# Patient Record
Sex: Female | Born: 1970 | Hispanic: No | Smoking: Never smoker
Health system: Southern US, Community
[De-identification: ages and names within clinical notes are randomized; demographics above are authoritative.]

## PROBLEM LIST (undated history)

## (undated) DIAGNOSIS — M545 Low back pain, unspecified: Secondary | ICD-10-CM

## (undated) DIAGNOSIS — R079 Chest pain, unspecified: Secondary | ICD-10-CM

## (undated) DIAGNOSIS — R87629 Unspecified abnormal cytological findings in specimens from vagina: Secondary | ICD-10-CM

## (undated) DIAGNOSIS — G510 Bell's palsy: Secondary | ICD-10-CM

## (undated) DIAGNOSIS — R7303 Prediabetes: Secondary | ICD-10-CM

## (undated) DIAGNOSIS — R202 Paresthesia of skin: Secondary | ICD-10-CM

## (undated) DIAGNOSIS — J45909 Unspecified asthma, uncomplicated: Secondary | ICD-10-CM

## (undated) DIAGNOSIS — M549 Dorsalgia, unspecified: Secondary | ICD-10-CM

## (undated) DIAGNOSIS — T8859XA Other complications of anesthesia, initial encounter: Secondary | ICD-10-CM

## (undated) DIAGNOSIS — D649 Anemia, unspecified: Secondary | ICD-10-CM

## (undated) DIAGNOSIS — K760 Fatty (change of) liver, not elsewhere classified: Secondary | ICD-10-CM

## (undated) DIAGNOSIS — I10 Essential (primary) hypertension: Secondary | ICD-10-CM

## (undated) DIAGNOSIS — R011 Cardiac murmur, unspecified: Secondary | ICD-10-CM

## (undated) DIAGNOSIS — M79609 Pain in unspecified limb: Secondary | ICD-10-CM

## (undated) DIAGNOSIS — E739 Lactose intolerance, unspecified: Secondary | ICD-10-CM

## (undated) DIAGNOSIS — Z973 Presence of spectacles and contact lenses: Secondary | ICD-10-CM

## (undated) DIAGNOSIS — E039 Hypothyroidism, unspecified: Secondary | ICD-10-CM

## (undated) DIAGNOSIS — Z8742 Personal history of other diseases of the female genital tract: Secondary | ICD-10-CM

## (undated) DIAGNOSIS — G459 Transient cerebral ischemic attack, unspecified: Secondary | ICD-10-CM

## (undated) DIAGNOSIS — Z91018 Allergy to other foods: Secondary | ICD-10-CM

## (undated) DIAGNOSIS — J189 Pneumonia, unspecified organism: Secondary | ICD-10-CM

## (undated) DIAGNOSIS — E079 Disorder of thyroid, unspecified: Secondary | ICD-10-CM

## (undated) DIAGNOSIS — K5900 Constipation, unspecified: Secondary | ICD-10-CM

## (undated) HISTORY — DX: Transient cerebral ischemic attack, unspecified: G45.9

## (undated) HISTORY — PX: WISDOM TOOTH EXTRACTION: SHX21

## (undated) HISTORY — PX: COLONOSCOPY: SHX174

## (undated) HISTORY — DX: Constipation, unspecified: K59.00

## (undated) HISTORY — DX: Fatty (change of) liver, not elsewhere classified: K76.0

## (undated) HISTORY — PX: ESSURE TUBAL LIGATION: SUR464

## (undated) HISTORY — DX: Unspecified abnormal cytological findings in specimens from vagina: R87.629

## (undated) HISTORY — DX: Allergy to other foods: Z91.018

## (undated) HISTORY — DX: Lactose intolerance, unspecified: E73.9

## (undated) HISTORY — DX: Dorsalgia, unspecified: M54.9

---

## 2000-05-25 ENCOUNTER — Ambulatory Visit (HOSPITAL_COMMUNITY): Admission: RE | Admit: 2000-05-25 | Discharge: 2000-05-25 | Payer: Self-pay | Admitting: *Deleted

## 2000-11-20 ENCOUNTER — Inpatient Hospital Stay (HOSPITAL_COMMUNITY): Admission: AD | Admit: 2000-11-20 | Discharge: 2000-11-20 | Payer: Self-pay | Admitting: Obstetrics & Gynecology

## 2000-12-01 ENCOUNTER — Inpatient Hospital Stay (HOSPITAL_COMMUNITY): Admission: AD | Admit: 2000-12-01 | Discharge: 2000-12-03 | Payer: Self-pay | Admitting: Obstetrics and Gynecology

## 2004-02-02 ENCOUNTER — Other Ambulatory Visit: Admission: RE | Admit: 2004-02-02 | Discharge: 2004-02-02 | Payer: Self-pay | Admitting: Obstetrics and Gynecology

## 2008-11-14 HISTORY — PX: ENDOMETRIAL ABLATION: SHX621

## 2009-07-07 ENCOUNTER — Emergency Department (HOSPITAL_COMMUNITY): Admission: EM | Admit: 2009-07-07 | Discharge: 2009-07-07 | Payer: Self-pay | Admitting: Emergency Medicine

## 2010-09-23 ENCOUNTER — Encounter: Admission: RE | Admit: 2010-09-23 | Discharge: 2010-09-23 | Payer: Self-pay | Admitting: Internal Medicine

## 2011-02-19 LAB — URINALYSIS, ROUTINE W REFLEX MICROSCOPIC
Bilirubin Urine: NEGATIVE
Glucose, UA: NEGATIVE mg/dL
Hgb urine dipstick: NEGATIVE
Ketones, ur: NEGATIVE mg/dL
Nitrite: NEGATIVE
Protein, ur: NEGATIVE mg/dL
Specific Gravity, Urine: 1.014 (ref 1.005–1.030)
Urobilinogen, UA: 0.2 mg/dL (ref 0.0–1.0)
pH: 6.5 (ref 5.0–8.0)

## 2011-02-19 LAB — POCT I-STAT, CHEM 8
BUN: 11 mg/dL (ref 6–23)
Calcium, Ion: 1.12 mmol/L (ref 1.12–1.32)
Chloride: 103 mEq/L (ref 96–112)
Creatinine, Ser: 0.6 mg/dL (ref 0.4–1.2)
Glucose, Bld: 88 mg/dL (ref 70–99)
HCT: 43 % (ref 36.0–46.0)
Hemoglobin: 14.6 g/dL (ref 12.0–15.0)
Potassium: 3.5 mEq/L (ref 3.5–5.1)
Sodium: 139 mEq/L (ref 135–145)
TCO2: 25 mmol/L (ref 0–100)

## 2011-02-19 LAB — URINE MICROSCOPIC-ADD ON

## 2011-02-19 LAB — CBC
HCT: 40 % (ref 36.0–46.0)
Hemoglobin: 13.6 g/dL (ref 12.0–15.0)
MCHC: 34 g/dL (ref 30.0–36.0)
MCV: 88.8 fL (ref 78.0–100.0)
Platelets: 150 10*3/uL (ref 150–400)
RBC: 4.51 MIL/uL (ref 3.87–5.11)
RDW: 16.5 % — ABNORMAL HIGH (ref 11.5–15.5)
WBC: 6.7 10*3/uL (ref 4.0–10.5)

## 2011-02-19 LAB — DIFFERENTIAL
Basophils Absolute: 0 10*3/uL (ref 0.0–0.1)
Basophils Relative: 0 % (ref 0–1)
Eosinophils Absolute: 0.2 10*3/uL (ref 0.0–0.7)
Eosinophils Relative: 3 % (ref 0–5)
Lymphocytes Relative: 26 % (ref 12–46)
Lymphs Abs: 1.7 10*3/uL (ref 0.7–4.0)
Monocytes Absolute: 0.5 10*3/uL (ref 0.1–1.0)
Monocytes Relative: 8 % (ref 3–12)
Neutro Abs: 4.3 10*3/uL (ref 1.7–7.7)
Neutrophils Relative %: 63 % (ref 43–77)

## 2011-02-19 LAB — POCT CARDIAC MARKERS
CKMB, poc: <1 ng/mL — ABNORMAL LOW (ref 1.0–8.0)
Myoglobin, poc: 39.6 ng/mL (ref 12–200)
Troponin i, poc: <0.05 ng/mL (ref 0.00–0.09)

## 2011-02-19 LAB — POCT PREGNANCY, URINE: Preg Test, Ur: NEGATIVE

## 2011-11-02 ENCOUNTER — Ambulatory Visit (INDEPENDENT_AMBULATORY_CARE_PROVIDER_SITE_OTHER): Payer: BC Managed Care – PPO

## 2011-11-02 DIAGNOSIS — M545 Low back pain, unspecified: Secondary | ICD-10-CM

## 2011-11-02 DIAGNOSIS — E039 Hypothyroidism, unspecified: Secondary | ICD-10-CM

## 2012-02-08 ENCOUNTER — Emergency Department (HOSPITAL_COMMUNITY): Payer: BC Managed Care – PPO

## 2012-02-08 ENCOUNTER — Ambulatory Visit (INDEPENDENT_AMBULATORY_CARE_PROVIDER_SITE_OTHER): Payer: BC Managed Care – PPO | Admitting: Family Medicine

## 2012-02-08 ENCOUNTER — Encounter (HOSPITAL_COMMUNITY): Payer: Self-pay

## 2012-02-08 ENCOUNTER — Other Ambulatory Visit: Payer: Self-pay

## 2012-02-08 ENCOUNTER — Observation Stay (HOSPITAL_COMMUNITY)
Admission: EM | Admit: 2012-02-08 | Discharge: 2012-02-09 | Disposition: A | Discharge status: Home / Self Care | Payer: BC Managed Care – PPO | Attending: Emergency Medicine | Admitting: Emergency Medicine

## 2012-02-08 VITALS — BP 133/82 | HR 63 | Temp 98.3°F | Resp 16 | Ht 62.0 in | Wt 183.0 lb

## 2012-02-08 DIAGNOSIS — R0602 Shortness of breath: Secondary | ICD-10-CM

## 2012-02-08 DIAGNOSIS — R079 Chest pain, unspecified: Secondary | ICD-10-CM

## 2012-02-08 DIAGNOSIS — R0989 Other specified symptoms and signs involving the circulatory and respiratory systems: Secondary | ICD-10-CM | POA: Insufficient documentation

## 2012-02-08 DIAGNOSIS — R0600 Dyspnea, unspecified: Secondary | ICD-10-CM

## 2012-02-08 DIAGNOSIS — E039 Hypothyroidism, unspecified: Secondary | ICD-10-CM

## 2012-02-08 DIAGNOSIS — R5383 Other fatigue: Secondary | ICD-10-CM

## 2012-02-08 DIAGNOSIS — R110 Nausea: Secondary | ICD-10-CM

## 2012-02-08 DIAGNOSIS — R0609 Other forms of dyspnea: Secondary | ICD-10-CM | POA: Insufficient documentation

## 2012-02-08 DIAGNOSIS — R5381 Other malaise: Secondary | ICD-10-CM

## 2012-02-08 HISTORY — DX: Anemia, unspecified: D64.9

## 2012-02-08 HISTORY — DX: Disorder of thyroid, unspecified: E07.9

## 2012-02-08 LAB — CBC
HCT: 39.9 % (ref 36.0–46.0)
Hemoglobin: 13.6 g/dL (ref 12.0–15.0)
MCH: 30.4 pg (ref 26.0–34.0)
MCHC: 34.1 g/dL (ref 30.0–36.0)
MCV: 89.1 fL (ref 78.0–100.0)
Platelets: 202 10*3/uL (ref 150–400)
RBC: 4.48 MIL/uL (ref 3.87–5.11)
RDW: 13.7 % (ref 11.5–15.5)
WBC: 6.2 10*3/uL (ref 4.0–10.5)

## 2012-02-08 LAB — BASIC METABOLIC PANEL
BUN: 9 mg/dL (ref 6–23)
CO2: 29 mEq/L (ref 19–32)
Calcium: 8.8 mg/dL (ref 8.4–10.5)
Chloride: 101 mEq/L (ref 96–112)
Creatinine, Ser: 0.6 mg/dL (ref 0.50–1.10)
GFR calc Af Amer: >90 mL/min (ref >90)
GFR calc non Af Amer: >90 mL/min (ref >90)
Glucose, Bld: 85 mg/dL (ref 70–99)
Potassium: 3.7 mEq/L (ref 3.5–5.1)
Sodium: 136 mEq/L (ref 135–145)

## 2012-02-08 LAB — TROPONIN I: Troponin I: <0.3 ng/mL (ref <0.30)

## 2012-02-08 MED ORDER — ONDANSETRON HCL 4 MG/2ML IJ SOLN
INTRAMUSCULAR | Status: AC
  Administered 2012-02-08: 4 mg
  Filled 2012-02-08: qty 2

## 2012-02-08 NOTE — Progress Notes (Signed)
Subjective:    Patient ID: Sarah Benjamin, female    DOB: [birth year], 41 y.o.   MRN: [id number]  HPI Sarah Benjamin is a 41 y.o. female Hx of hypothyroidism - on meds, C/o pain in center of chest - woke up at 3am with pain - sharp and pressure pain, woke up out of sleep, no similar pain in past, no initial radiation - feels like something pressing out. Nausea with pain, but no vomiting. No diaphoresis, but felt fatigue, and dyspnea - like had been walking awhile. Lasted 5 minutes. Pain off and on since that time all day, comes off and own - lasts for about a minute then resolves.  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 Pain into neck and radiated down L arm today at work - at 8am.  Feels sore in area now, and sore to press on area of chest.  No belching, denies heartburn, but nausea persisted today.  Current pain 6-7/10.  Pain 10/10 at 3 am.  Has been for fatigued past 1 week - more dyspnea on exertion.  2 pillows, but no orthopnea.as felt dizziness past 2 weeks - worse with sitting up or stretching.   Both legs felt numb 2 days ago.  Hx hip/back pain in December - has not taken Mobic or flexeril recently. Has felt dizziness past 2 weeks - worse with sitting up or stretching.   Both legs felt numb 2 days ago.  Hx hip/back pain in December - has not taken Mobic or flexeril recently.   Tx: ASA 325mg  x 2 for headache this morning at 8 am.  No tobacco, Etoh, or illicit drug use. No known FH CAD, but dad and sister with "hole in heart".  Review of Systems  Constitutional: Positive for fatigue. Negative for fever and chills.  Respiratory: Positive for chest tightness and shortness of breath.   Cardiovascular: Positive for chest pain.  Gastrointestinal: Positive for nausea.  Skin: Negative for rash.  Neurological: Positive for dizziness.   EKG: NSR, no acute findings, no apparent change form      Objective:   Physical Exam  Constitutional: She appears well-developed and well-nourished.  No distress.  HENT:  Head: Normocephalic and atraumatic.  Mouth/Throat: Oropharynx is clear and moist.  Eyes: Conjunctivae are normal. Pupils are equal, round, and reactive to light.  Neck: No JVD present. Carotid bruit is not present.       Slight discomfort with neck ROM.  Cardiovascular: Normal rate, regular rhythm, normal heart sounds and intact distal pulses.  PMI is not displaced.  Exam reveals no gallop and no friction rub.   No murmur heard. Pulmonary/Chest: Effort normal and breath sounds normal. She has no wheezes. She has no rales.  Abdominal: Soft.  Musculoskeletal: Normal range of motion. She exhibits no tenderness.       Right lower leg: She exhibits no tenderness and no swelling.       Left lower leg: She exhibits no tenderness and no swelling.  Skin: She is not diaphoretic.       Assessment & Plan:  Sarah Benjamin is a 41 y.o. female 1. Hypothyroidism   2. Chest pain   3. Nausea   4. Fatigue   5. Dyspnea   1 week of fatigue, dyspnea on exertion, and 2 week history of intermittent dizziness, with acute onset of substernal chest pain, nausea, dyspnea at 3 am, and progression to neck and L arm.  No acute findings on EKG, but concerning history -  past week and today.    2033 - EMS contacted for transport, O2Nc at 2 liters, placed on monitor.  Patient had ASA this am. 2035 - IV placed 2045 - care transferred to EMS for transport to [facility] for eval.

## 2012-02-08 NOTE — ED Notes (Signed)
Per ems-pt has been SOB x1wk. Pt woke up at 3am today with substernal chest pain radiating to left arm. Pt also c/o nausea, denies vomiting. Pt received 4mg  zofran, 324 asa and 1SL nitro. CP went from 6/10 to 4/10.

## 2012-02-08 NOTE — ED Notes (Signed)
Pt stated a hx of itching with use of condoms. Unsure if allergy to latex or not. Non-latex used for EKG.

## 2012-02-08 NOTE — Progress Notes (Signed)
  Subjective:    Patient ID: Sarah Benjamin, female    DOB: 16-May-1971, 41 y.o.   MRN: [id number]  HPI    Review of Systems     Objective:   Physical Exam  Addendum:  EKG: NSR, no acute findings or apparent change form prior EKG.    Assessment & Plan:

## 2012-02-09 ENCOUNTER — Other Ambulatory Visit: Payer: Self-pay

## 2012-02-09 ENCOUNTER — Observation Stay (HOSPITAL_COMMUNITY)
Admit: 2012-02-09 | Discharge: 2012-02-09 | Disposition: A | Discharge status: Home / Self Care | Payer: BC Managed Care – PPO | Attending: Emergency Medicine | Admitting: Emergency Medicine

## 2012-02-09 LAB — POCT I-STAT TROPONIN I
Troponin i, poc: 0 ng/mL (ref 0.00–0.08)
Troponin i, poc: 0 ng/mL (ref 0.00–0.08)
Troponin i, poc: 0 ng/mL (ref 0.00–0.08)

## 2012-02-09 MED ORDER — ACETAMINOPHEN 325 MG PO TABS
ORAL_TABLET | ORAL | Status: AC
  Filled 2012-02-09: qty 2

## 2012-02-09 MED ORDER — ACETAMINOPHEN 325 MG PO TABS
650.0000 mg | ORAL_TABLET | Freq: Once | ORAL | Status: AC
  Administered 2012-02-09: 650 mg via ORAL

## 2012-02-09 MED ORDER — METOPROLOL TARTRATE 1 MG/ML IV SOLN
INTRAVENOUS | Status: AC
  Administered 2012-02-09: 5 mg via INTRAVENOUS
  Filled 2012-02-09: qty 5

## 2012-02-09 MED ORDER — NITROGLYCERIN 0.4 MG SL SUBL
0.4000 mg | SUBLINGUAL_TABLET | Freq: Once | SUBLINGUAL | Status: AC
  Administered 2012-02-09: 0.4 mg via SUBLINGUAL

## 2012-02-09 MED ORDER — METOPROLOL TARTRATE 25 MG PO TABS
50.0000 mg | ORAL_TABLET | Freq: Once | ORAL | Status: AC
  Administered 2012-02-09: 50 mg via ORAL
  Filled 2012-02-09: qty 2

## 2012-02-09 MED ORDER — SODIUM CHLORIDE 0.9 % IV SOLN: 1000.0000 mL | INTRAVENOUS | Status: DC

## 2012-02-09 MED ORDER — METOPROLOL TARTRATE 1 MG/ML IV SOLN
5.0000 mg | Freq: Once | INTRAVENOUS | Status: AC
  Administered 2012-02-09: 5 mg via INTRAVENOUS

## 2012-02-09 MED ORDER — IOHEXOL 350 MG/ML SOLN
80.0000 mL | Freq: Once | INTRAVENOUS | Status: AC | PRN
  Administered 2012-02-09: 80 mL via INTRAVENOUS

## 2012-02-09 NOTE — Discharge Instructions (Signed)
Chest Pain (Nonspecific) It is often hard to give a specific diagnosis for the cause of chest pain. There is always a chance that your pain could be related to something serious, such as a heart attack or a blood clot in the lungs. You need to follow up with your caregiver for further evaluation. CAUSES   Heartburn.   Pneumonia or bronchitis.   Anxiety or stress.   Inflammation around your heart (pericarditis) or lung (pleuritis or pleurisy).   A blood clot in the lung.   A collapsed lung (pneumothorax). It can develop suddenly on its own (spontaneous pneumothorax) or from injury (trauma) to the chest.   Shingles infection (herpes zoster virus).  The chest wall is composed of bones, muscles, and cartilage. Any of these can be the source of the pain.  The bones can be bruised by injury.   The muscles or cartilage can be strained by coughing or overwork.   The cartilage can be affected by inflammation and become sore (costochondritis).  DIAGNOSIS  Lab tests or other studies, such as X-rays, electrocardiography, stress testing, or cardiac imaging, may be needed to find the cause of your pain.  TREATMENT   Treatment depends on what may be causing your chest pain. Treatment may include:   Acid blockers for heartburn.   Anti-inflammatory medicine.   Pain medicine for inflammatory conditions.   Antibiotics if an infection is present.   You may be advised to change lifestyle habits. This includes stopping smoking and avoiding alcohol, caffeine, and chocolate.   You may be advised to keep your head raised (elevated) when sleeping. This reduces the chance of acid going backward from your stomach into your esophagus.   Most of the time, nonspecific chest pain will improve within 2 to 3 days with rest and mild pain medicine.  HOME CARE INSTRUCTIONS   If antibiotics were prescribed, take your antibiotics as directed. Finish them even if you start to feel better.   For the next few  days, avoid physical activities that bring on chest pain. Continue physical activities as directed.   Do not smoke.   Avoid drinking alcohol.   Only take over-the-counter or prescription medicine for pain, discomfort, or fever as directed by your caregiver.   Follow your caregiver's suggestions for further testing if your chest pain does not go away.   Keep any follow-up appointments you made. If you do not go to an appointment, you could develop lasting (chronic) problems with pain. If there is any problem keeping an appointment, you must call to reschedule.  SEEK MEDICAL CARE IF:   You think you are having problems from the medicine you are taking. Read your medicine instructions carefully.   Your chest pain does not go away, even after treatment.   You develop a rash with blisters on your chest.  SEEK IMMEDIATE MEDICAL CARE IF:   You have increased chest pain or pain that spreads to your arm, neck, jaw, back, or abdomen.   You develop shortness of breath, an increasing cough, or you are coughing up blood.   You have severe back or abdominal pain, feel nauseous, or vomit.   You develop severe weakness, fainting, or chills.   You have a fever.  THIS IS AN EMERGENCY. Do not wait to see if the pain will go away. Get medical help at once. Call your local emergency services (911 in [place] Do not drive yourself to the hospital. MAKE SURE YOU:   Understand these instructions.     Will watch your condition.   Will get help right away if you are not doing well or get worse.  Document Released: 08/10/2005 Document Revised: 10/20/2011 Document Reviewed: 06/05/2008 [employer] Patient Information 2012 [employer]. 

## 2012-02-09 NOTE — ED Provider Notes (Signed)
History    41 year old female chest pain. Substernal. No radiation. Onset initially around 3:00 this morning. Woke her up from her sleep. Describes the pain as sharp and lasts about a minute. Has had intermittently throughout the day. No appreciable exacerbating relieving factors. Associated with some mild dyspnea. No diaphoresis or palpitations. No unusual leg pain or swelling. Denies history of blood clot. Denies exogenous estrogen use. No recent surgery, hx of CA or prolonged immobilization. No history of similar symptoms.  CSN: [id number]  Arrival date & time 02/08/12  2117   First MD Initiated Contact with Patient 02/08/12 2141      Chief Complaint  Patient presents with  . Chest Pain    (Consider location/radiation/quality/duration/timing/severity/associated sxs/prior treatment) HPI  Past Medical History  Diagnosis Date  . Thyroid disease   . Anemia     History reviewed. No pertinent past surgical history.  History reviewed. No pertinent family history.  History  Substance Use Topics  . Smoking status: Never Smoker   . Smokeless tobacco: Not on file  . Alcohol Use: No    OB History    Grav Para Term Preterm Abortions TAB SAB Ect Mult Living                  Review of Systems   Review of symptoms negative unless otherwise noted in HPI.   Allergies  Review of patient's allergies indicates no known allergies.  Home Medications   Current Outpatient Rx  Name Route Sig Dispense Refill  . LEVOTHYROXINE SODIUM 25 MCG PO TABS Oral Take 25 mcg by mouth daily.      BP 119/77  Pulse 64  Temp(Src) 98.7 F (37.1 C) (Oral)  Resp 15  SpO2 99%  LMP 02/01/2012  Physical Exam  Nursing note and vitals reviewed. Constitutional: She appears well-developed and well-nourished. No distress.  HENT:  Head: Normocephalic and atraumatic.  Eyes: Conjunctivae are normal. Right eye exhibits no discharge. Left eye exhibits no discharge.  Neck: Neck supple.  Cardiovascular:  Normal rate, regular rhythm and normal heart sounds.  Exam reveals no gallop and no friction rub.   No murmur heard. Pulmonary/Chest: Effort normal and breath sounds normal. No respiratory distress. She exhibits no tenderness.  Abdominal: Soft. She exhibits no distension. There is no tenderness.  Musculoskeletal: She exhibits no edema and no tenderness.  Neurological: She is alert.  Skin: Skin is warm and dry.  Psychiatric: She has a normal mood and affect. Her behavior is normal. Thought content normal.    ED Course  Procedures (including critical care time)   Labs Reviewed  TROPONIN I  CBC  BASIC METABOLIC PANEL   Dg Chest 2 View  02/08/2012  *RADIOLOGY REPORT*  Clinical Data: Chest pain.  CHEST - 2 VIEW  Comparison: 07/07/2009  Findings: The cardiomediastinal silhouette is unremarkable. There is no evidence of focal airspace disease, pulmonary edema, suspicious pulmonary nodule/mass, pleural effusion, or pneumothorax. No acute bony abnormalities are identified.  IMPRESSION: No evidence of acute cardiopulmonary disease.  Original Report Authenticated By: Rosendo Gros, M.D.    EKG:  Rhythm: Normal sinus rhythm Rate: 64 Axis: Normal axis Intervals: Normal ST segments: Nonspecific ST changes. There is T-wave flattening in lead 3.  1. Chest pain       MDM  41 year old female with chest pain. Currently pain free. Pain is somewhat atypical given relatively brief nature. EKG is non-provocative and troponin is normal. Patient does have a few risk factors including obesity and  family  history of coronary artery disease. Will place in CDU under the chest pain protocol. Her BMI is approximately 33 and she's a candidate for CT of her coronaries. PE consider but doubt. Doubt infectious. Afebrile and clinically well appearing. No respiratory complaints. CXR clear. Discussed plan in detail with pt and is in agreement.        Raeford Razor, MD 02/09/12 [phone]

## 2012-02-09 NOTE — ED Notes (Signed)
Returned from ct. C/o severe h/a.

## 2012-02-09 NOTE — ED Notes (Signed)
BMI 33.3

## 2012-02-09 NOTE — ED Provider Notes (Signed)
8:30 AM Pt in the CDU on chest pain protocol. Troponin negative x 3. EKG without acute findings.   On my assessment, pt denies chest pain. She is awake, alert, oriented, and in NAD. Lungs CTAB. Heart RRR. Abd soft, NT, ND. MAEW. Speech clear.    No needs at this time. Awaiting coronary CT.     11:17 AM Coronary CT with calcium score of zero and no evidence of CAD. Possible small PFO, which I have discussed with the patient. She will be discharged home. Sees an urgent care for her primary medical needs- I recommended following up there for further evaluation if she has continued symptoms as it has been determined this was not cardiac in nature. Patient voices understanding.  Shaaron Adler, PA-C 02/09/12 1120

## 2012-02-09 NOTE — ED Notes (Signed)
Transported to ct for coronary scan

## 2012-02-10 ENCOUNTER — Encounter: Payer: Self-pay | Admitting: Family Medicine

## 2012-02-13 NOTE — Progress Notes (Signed)
Observation review is complete for 3/27 visit.

## 2012-02-15 NOTE — ED Provider Notes (Signed)
Medical screening examination/treatment/procedure(s) were performed by non-physician practitioner and as supervising physician I was immediately available for consultation/collaboration.  Kately Graffam, MD 02/15/12 2347 

## 2014-01-13 DIAGNOSIS — E039 Hypothyroidism, unspecified: Secondary | ICD-10-CM | POA: Insufficient documentation

## 2014-01-13 DIAGNOSIS — G43909 Migraine, unspecified, not intractable, without status migrainosus: Secondary | ICD-10-CM | POA: Insufficient documentation

## 2014-01-13 DIAGNOSIS — K5900 Constipation, unspecified: Secondary | ICD-10-CM | POA: Insufficient documentation

## 2015-02-18 DIAGNOSIS — J4530 Mild persistent asthma, uncomplicated: Secondary | ICD-10-CM | POA: Insufficient documentation

## 2015-04-17 DIAGNOSIS — M47816 Spondylosis without myelopathy or radiculopathy, lumbar region: Secondary | ICD-10-CM | POA: Insufficient documentation

## 2015-04-17 DIAGNOSIS — M25559 Pain in unspecified hip: Secondary | ICD-10-CM | POA: Insufficient documentation

## 2015-04-17 DIAGNOSIS — M549 Dorsalgia, unspecified: Secondary | ICD-10-CM | POA: Insufficient documentation

## 2015-10-20 ENCOUNTER — Ambulatory Visit (INDEPENDENT_AMBULATORY_CARE_PROVIDER_SITE_OTHER): Payer: BLUE CROSS/BLUE SHIELD | Admitting: Osteopathic Medicine

## 2015-10-20 ENCOUNTER — Encounter: Payer: Self-pay | Admitting: Osteopathic Medicine

## 2015-10-20 VITALS — BP 129/83 | HR 65 | Ht 62.0 in | Wt 202.0 lb

## 2015-10-20 DIAGNOSIS — A499 Bacterial infection, unspecified: Secondary | ICD-10-CM

## 2015-10-20 DIAGNOSIS — E039 Hypothyroidism, unspecified: Secondary | ICD-10-CM | POA: Diagnosis not present

## 2015-10-20 DIAGNOSIS — J329 Chronic sinusitis, unspecified: Secondary | ICD-10-CM | POA: Diagnosis not present

## 2015-10-20 DIAGNOSIS — B9689 Other specified bacterial agents as the cause of diseases classified elsewhere: Secondary | ICD-10-CM

## 2015-10-20 MED ORDER — AMOXICILLIN-POT CLAVULANATE 875-125 MG PO TABS: 1.0000 | ORAL_TABLET | Freq: Two times a day (BID) | ORAL | Status: DC

## 2015-10-20 NOTE — Progress Notes (Signed)
HPI: Sarah Benjamin is a 44 y.o. female who presents to [place]  Today for chief complaint of:  Chief Complaint  Patient presents with  . Establish Care    Sinus congestion: . Location: sinuses, coughing . Quality: soreness, congestion, sore throat . Duration: 2 weeks . Modifying factors: using inhaler at night to help sleep, using Theraflu . Assoc signs/symptoms: No fever or chills  also has thyroid problem, she needs this looked a,t, overdue for labs and almost out of meds.    Past medical, social and family history reviewed: Past Medical History  Diagnosis Date  . Thyroid disease   . Anemia    No past surgical history on file. Social History  Substance Use Topics  . Smoking status: Never Smoker   . Smokeless tobacco: Not on file  . Alcohol Use: No   No family history on file.  Current Outpatient Prescriptions  Medication Sig Dispense Refill  . albuterol (PROVENTIL HFA;VENTOLIN HFA) 108 (90 BASE) MCG/ACT inhaler Inhale 2 puffs into the lungs.    Marland Kitchen levothyroxine (SYNTHROID, LEVOTHROID) 75 MCG tablet Take 1 tablet (75 mcg total) by mouth daily. 30 tablet 1  . montelukast (SINGULAIR) 10 MG tablet Take 10 mg by mouth.    Marland Kitchen amoxicillin-clavulanate (AUGMENTIN) 875-125 MG tablet Take 1 tablet by mouth 2 (two) times daily. 10 tablet 0   No current facility-administered medications for this visit.   No Known Allergies    Review of Systems: CONSTITUTIONAL:  No  fever, no chills, No  unintentional weight changes HEAD/EYES/EARS/NOSE/THROAT: Yes  headache, no vision change, no hearing change, No  sore throat, Yes  sinus pressure CARDIAC: No  chest pain, No  pressure, No palpitations, No  orthopnea RESPIRATORY: Yes  cough, No  shortness of breath/wheeze GASTROINTESTINAL: No  nausea, No  vomiting, No  abdominal pain, No  blood in stool, No  diarrhea, No  constipation  MUSCULOSKELETAL: No  myalgia/arthralgia GENITOURINARY: No  incontinence, No   abnormal genital bleeding/discharge SKIN: No  rash/wounds/concerning lesions HEM/ONC: No  easy bruising/bleeding, No  abnormal lymph node ENDOCRINE: No  polyuria/polydipsia/polyphagia, No  heat/cold intolerance  NEUROLOGIC: No  weakness, No  dizziness, No  slurred speech PSYCHIATRIC: No  concerns with depression, No  concerns with anxiety, No sleep problems    Exam:  BP 129/83 mmHg  Pulse 65  Ht 5\' 2"  (1.575 m)  Wt 202 lb (91.627 kg)  BMI 36.94 kg/m2 Constitutional: VS see above. General Appearance: alert, well-developed, well-nourished, NAD Eyes: Normal lids and conjunctive, non-icteric sclera, PERRLA Ears, Nose, Mouth, Throat: MMM, Normal external inspection ears/nares/mouth/lips/gums, TM normal, posterior pharynx Yes  erythema No  Exudate, positive tenderness to the left maxillary sinuses Neck: No masses, trachea midline. No thyroid enlargement/tenderness/mass appreciated. No lymphadenopathy Respiratory: Normal respiratory effort. no wheeze, no rhonchi, no rales Cardiovascular: S1/S2 normal, no murmur, no rub/gallop auscultated. RRR.  No carotid bruit or JVD. No abdominal aortic bruit.  Pedal pulse II/IV bilaterally DP and PT.  No lower extremity edema. Gastrointestinal: Nontender, no masses. No hepatomegaly, no splenomegaly. No hernia appreciated. Bowel sounds normal. Rectal exam deferred.  Musculoskeletal: Gait normal. No clubbing/cyanosis of digits.  Neurological: No cranial nerve deficit on limited exam. Motor and sensation intact and symmetric Skin: warm, dry, intact. No rash/ulcer. No concerning nevi or subq nodules on limited exam.   Psychiatric: Normal judgment/insight. Normal mood and affect. Oriented x3.    Results for orders placed or performed in visit on 10/20/15 (from the past  72 hour(s))  TSH     Status: Abnormal   Collection Time: 10/20/15  4:40 PM  Result Value Ref Range   TSH 6.178 (H) 0.350 - 4.500 uIU/mL   Procedure OMT: Lymphatic technique applied to  thoracic duct, cervical lymph node chain, effleurage to sinuses, positive patient relief    ASSESSMENT/PLAN:  Bacterial sinusitis - Plan: amoxicillin-clavulanate (AUGMENTIN) 875-125 MG tablet  Hypothyroidism, unspecified hypothyroidism type - Plan: TSH, levothyroxine (SYNTHROID, LEVOTHROID) 88 MCG tabletCalled patient to let her know to adjust this medicine, need to follow-up in the next 6-8 weeks for annual physical exam and repeat labs   Return in about 6 weeks (around 12/01/2015), or sooner if symptoms worsen or fail to improve, for annual physical, recheck labs.

## 2015-10-21 LAB — TSH: TSH: 6.178 u[IU]/mL — ABNORMAL HIGH (ref 0.350–4.500)

## 2015-10-21 MED ORDER — LEVOTHYROXINE SODIUM 88 MCG PO TABS: 88.0000 ug | ORAL_TABLET | Freq: Every day | ORAL | Status: DC

## 2015-12-02 ENCOUNTER — Telehealth: Payer: Self-pay

## 2015-12-02 DIAGNOSIS — E039 Hypothyroidism, unspecified: Secondary | ICD-10-CM

## 2015-12-02 NOTE — Telephone Encounter (Signed)
Jodeci called and reports having dizziness and nausea after taking levothyroxine. She believes it is to strong. Please advise.

## 2015-12-02 NOTE — Telephone Encounter (Signed)
Please confirm when she started the new dose, she should have started it about a month ago, if she is just now having problems with dizziness or nausea most likely it is not due to the medication but I'm unclear on when she started it based on this message. Based on our records she was on 75 g and I went up to the next highest dose at 88, please confirm this too. Thanks

## 2015-12-03 MED ORDER — LEVOTHYROXINE SODIUM 75 MCG PO TABS: 75.0000 ug | ORAL_TABLET | Freq: Every day | ORAL | Status: DC

## 2015-12-03 NOTE — Telephone Encounter (Signed)
Patient advised of recommendations.  

## 2015-12-03 NOTE — Telephone Encounter (Signed)
Patient states she has been having symptoms the whole time and was waiting to see if it resolved. She is taking the 88 mcg and did start it about a month ago.

## 2015-12-03 NOTE — Telephone Encounter (Signed)
Okay, I sent to 75 g back in, if she has any further problems she will need to come to the office for a visit, she still needs to follow-up as directed to repeat the labs

## 2016-01-12 ENCOUNTER — Ambulatory Visit (INDEPENDENT_AMBULATORY_CARE_PROVIDER_SITE_OTHER): Payer: BLUE CROSS/BLUE SHIELD | Admitting: Osteopathic Medicine

## 2016-01-12 ENCOUNTER — Encounter: Payer: Self-pay | Admitting: Osteopathic Medicine

## 2016-01-12 VITALS — BP 134/76 | HR 58 | Temp 98.3°F | Ht 62.0 in | Wt 205.0 lb

## 2016-01-12 DIAGNOSIS — Z13220 Encounter for screening for lipoid disorders: Secondary | ICD-10-CM

## 2016-01-12 DIAGNOSIS — R079 Chest pain, unspecified: Secondary | ICD-10-CM | POA: Diagnosis not present

## 2016-01-12 DIAGNOSIS — Q211 Atrial septal defect: Secondary | ICD-10-CM

## 2016-01-12 DIAGNOSIS — J069 Acute upper respiratory infection, unspecified: Secondary | ICD-10-CM | POA: Diagnosis not present

## 2016-01-12 DIAGNOSIS — E039 Hypothyroidism, unspecified: Secondary | ICD-10-CM | POA: Diagnosis not present

## 2016-01-12 DIAGNOSIS — Z131 Encounter for screening for diabetes mellitus: Secondary | ICD-10-CM

## 2016-01-12 DIAGNOSIS — Q2112 Patent foramen ovale: Secondary | ICD-10-CM | POA: Insufficient documentation

## 2016-01-12 DIAGNOSIS — Z79899 Other long term (current) drug therapy: Secondary | ICD-10-CM

## 2016-01-12 LAB — CBC WITH DIFFERENTIAL/PLATELET
Basophils Absolute: 0 10*3/uL (ref 0.0–0.1)
Basophils Relative: 0 % (ref 0–1)
Eosinophils Absolute: 0.3 10*3/uL (ref 0.0–0.7)
Eosinophils Relative: 6 % — ABNORMAL HIGH (ref 0–5)
HCT: 42.5 % (ref 36.0–46.0)
Hemoglobin: 14.1 g/dL (ref 12.0–15.0)
Lymphocytes Relative: 26 % (ref 12–46)
Lymphs Abs: 1.2 10*3/uL (ref 0.7–4.0)
MCH: 29.4 pg (ref 26.0–34.0)
MCHC: 33.2 g/dL (ref 30.0–36.0)
MCV: 88.7 fL (ref 78.0–100.0)
MPV: 13.9 fL — ABNORMAL HIGH (ref 8.6–12.4)
Monocytes Absolute: 0.5 10*3/uL (ref 0.1–1.0)
Monocytes Relative: 11 % (ref 3–12)
Neutro Abs: 2.6 10*3/uL (ref 1.7–7.7)
Neutrophils Relative %: 57 % (ref 43–77)
Platelets: 186 10*3/uL (ref 150–400)
RBC: 4.79 MIL/uL (ref 3.87–5.11)
RDW: 14.2 % (ref 11.5–15.5)
WBC: 4.6 10*3/uL (ref 4.0–10.5)

## 2016-01-12 LAB — COMPLETE METABOLIC PANEL WITH GFR
ALT: 34 U/L — ABNORMAL HIGH (ref 6–29)
AST: 25 U/L (ref 10–30)
Albumin: 4 g/dL (ref 3.6–5.1)
Alkaline Phosphatase: 78 U/L (ref 33–115)
BUN: 15 mg/dL (ref 7–25)
CO2: 23 mmol/L (ref 20–31)
Calcium: 8.7 mg/dL (ref 8.6–10.2)
Chloride: 106 mmol/L (ref 98–110)
Creat: 0.71 mg/dL (ref 0.50–1.10)
GFR, Est African American: >89 mL/min (ref >=60)
GFR, Est Non African American: >89 mL/min (ref >=60)
Glucose, Bld: 102 mg/dL — ABNORMAL HIGH (ref 65–99)
Potassium: 4.5 mmol/L (ref 3.5–5.3)
Sodium: 138 mmol/L (ref 135–146)
Total Bilirubin: 0.5 mg/dL (ref 0.2–1.2)
Total Protein: 6.6 g/dL (ref 6.1–8.1)

## 2016-01-12 LAB — LIPID PANEL
Cholesterol: 159 mg/dL (ref 125–200)
HDL: 38 mg/dL — ABNORMAL LOW (ref >=46)
LDL Cholesterol: 99 mg/dL (ref <130)
Total CHOL/HDL Ratio: 4.2 Ratio (ref <=5.0)
Triglycerides: 108 mg/dL (ref <150)
VLDL: 22 mg/dL (ref <30)

## 2016-01-12 LAB — TSH: TSH: 4.44 mIU/L

## 2016-01-12 LAB — MAGNESIUM: Magnesium: 2 mg/dL (ref 1.5–2.5)

## 2016-01-12 MED ORDER — BENZONATATE 200 MG PO CAPS: 200.0000 mg | ORAL_CAPSULE | Freq: Three times a day (TID) | ORAL | Status: DC | PRN

## 2016-01-12 MED ORDER — IPRATROPIUM BROMIDE 0.03 % NA SOLN: 2.0000 | Freq: Three times a day (TID) | NASAL | Status: DC

## 2016-01-12 NOTE — Patient Instructions (Addendum)
Viral Infections A virus is a type of germ. Viruses can cause:  Minor sore throats.  Aches and pains.  Headaches.  Runny nose.  Rashes.  Watery eyes.  Tiredness.  Coughs.  Loss of appetite.  Feeling sick to your stomach (nausea).  Throwing up (vomiting).  Watery poop (diarrhea). HOME CARE   Only take medicines as told by your doctor - see below  Drink enough water and fluids to keep your pee (urine) clear or pale yellow. Sports drinks are a good choice.  Get plenty of rest and eat healthy. Soups and broths with crackers or rice are fine. GET HELP RIGHT AWAY IF:   You have a very bad headache.  You have shortness of breath.  You have chest pain or neck pain.  You have an unusual rash.  You cannot stop throwing up.  You have watery poop that does not stop.  You cannot keep fluids down.  You or your child has a temperature by mouth above 102 F (38.9 C), not controlled by medicine.  Your baby is older than 3 months with a rectal temperature of 102 F (38.9 C) or higher.  Your baby is 2 months old or younger with a rectal temperature of 100.4 F (38 C) or higher. MAKE SURE YOU:   Understand these instructions.  Will watch this condition.  Will get help right away if you are not doing well or get worse.   This information is not intended to replace advice given to you by your health care provider. Make sure you discuss any questions you have with your health care provider.   Document Released: 10/13/2008 Document Revised: 01/23/2012 Document Reviewed: 04/08/2015 Elsevier Interactive Patient Education 2016 [place] CARE INSTRUCTIONS: UPPER RESPIRATORY ILLNESS AND SINUSITIS   FRIST, A FEW NOTES ON OVER-THE-COUNTER MEDICATIONS!  . USE CAUTION - MANY OVER-THE-COUNTER MEDICATIONS COME IN COMBINATIONS OF MULTIPLE GENERICS. FOR INSTANCE, NYQUIL HAS TYLENOL + COUGH MEDICINE + AN ANTIHISTAMINE, SO BE CAREFUL YOU'RE NOT  TAKING A COMBINATION MEDICINE WHICH CONTAINS MEDICATIONS YOU'RE ALSO TAKING SEPARATELY (LIKE NYQUIL SYRUP AS WELL AS TYLENOL PILLS).  Marland Kitchen YOUR PHARMACIST CAN HELP YOU AVOID MEDICATION INTERACTIONS AND DUPLICATIONS - ASK FOR THEIR HELP IF YOU ARE CONFUSED OR UNSURE ABOUT WHAT TO PURCHASE OVER-THE-COUNTER!  . REMEMBER - IF YOU'RE EVER CONCERNED ABOUT MEDICATION SIDE EFFECTS, OR IF YOU'RE EVER CONCERNED YOUR SYMPTOMS ARE GETTING WORSE DESPITE TREATMENT, PLEASE CALL THE OFFICE!   TREAT SINUS CONGESTION, RUNNY NOSE & POSTNASAL DRIP: . Treat to increase airflow through sinuses, decrease congestion pain and prevent bacterial growth!  . Remember, only 0.5-2% of sinus infections are due to a bacteria, the rest are due to a virus (usually the common cold)! Trust your doctor when he or she decides whether or not you really need an antibiotic!   NASAL SPRAYS: generally safe and should not interact with other medicines. Can take any of these medications, either alone or together... FLONASE (FLUTICASONE) - 2 sprays in each nostril twice per day (also a great allergy medicine to use long-term!) AFRIN (OXYMETOLAZONE) - use sparingly because it will cause rebound congestion, NEVER USE IN KIDS   SALINE NASAL SPRAY- no limit, but avoid use after other nasal sprays or it can wash the medicine away PRESCRTIPTION ATROVENT - as directed on prescription bottle  ANTIHISTAMINES: Helps dry out runny nose and decreases postnasal drip. Benadryl may cause drowsiness but other preparations should not be as sedating. Certain kinds are  not as safe in elderly individuals. OK to use unless Dr A says otherwise.  Only use one of the following... BENADRYL (DIPHENHYDRAMINE) - 25-50 mg every 6 hours ZYRTEC (CETIRIZINE) - 5-10 mg daily CLARITIN (LORATIDINE) - less potent. 10 mg daily ALLEGRA (FEXOFENADINE) - least likely to cause drowsiness! 180 mg daily or 60 mg twice per day  DECONGESTANTS: Helps dry out runny nose and helps with  sinus pain. May cause insomnia, or sometimes elevated heart rate. Can cause problems if used often in people with high blood pressure. OK to use unless Dr A says otherwise. NEVER USE IN KIDS UNDER 37 YEARS OLD. Only use one of the following... SUDAFED (PSEUDOEPHEDINE) - 60 mg every 4 - 6 hours, also comes in 120 mg extended release every 12 hours, maximum 240 mg in 24 hours.  SUDAED PE (PHENYLEPHRINE) - 10 mg every 4 - 6 hours, maximum 60 mg per day  COMBINATIONS OF ANTIHISTAMINE + DECONGESTANT: these usually require you to show your ID at the pharmacy counter. You can also purchase these medicines separately as noted above.  Only use one of the following... ZYRTEC-D (CETIRIZINE + PSEUDOEPHEDRINE) - 12 hour formulation as directed CLARITIN-D (LORATIDINE + PSEUDOEPHEDRINE) - 12 and 24 hour formulations as directed ALLEGRA-D (FEXOFENADINE + PSEUDOEPHEDRINE) - 12 and 24 hour formulations as directed  PRESCRIPTION TREATMENT FOR SINUS PROBLEMS: Can include nasal sprays, pills, or antibiotics in the case of true bacterial infection. Not everyone needs an antibiotic but there are other medicines which will help you feel better while your body fights the infection!     TREAT COUGH & SORE THROAT: . Remember, cough is the body's way of protecting your airways and lungs, it's a hard-wired reflex that is tough for medicines to treat!  . Irritation to the airways will cause cough. This irritation is usually caused by upper airway problems like postnasal drip (treat as above) and viral sore throat, but in severe cases can be due to lower airway problems like bronchitis or pneumonia, which a doctor can usually hear on exam of your lungs or an X-ray. . Sore throat is almost always due to a virus, but occasionally caused by Strep, which requires antibiotics.  . Exercise and smoking may make cough worse - take it easy while you're sick, and QUIT SMOKING!   . Cough due to viral infection can linger for 2 weeks or  so. If you're coughing longer than you think you should, or if the cough is severe, please make an appointment in the office - you may need a chest X-ray.   EXPECTORANT: Used to help clear airways, take these with PLENTY of water to help thin mucus secretions and make the mucus easier to cough up   Only use one of the following... ROBITUSSIN (DEXTROMETHORPHAN OR GUAIFENISEN depending on formulation)  MUCINEX (GUAIFENICEN) - usually longer acting  COUGH DROPS/LOZENGES: Whichever over-the-counter agent you prefer!  Here are some suggestions for ingredients to look for (can take both)... BENZOCAINE - numbing effect, also in [place] - cooling effect  HONEY: has gone head-to-head in several clinical trials with prescription cough medicines and found to be equally effective! Try 1 Teaspoon Honey + 2 Drops Lemon Juice, as much as you want to use. NONE FOR KIDS UNDER AGE 80  HERBAL TEA: There are certain ingredients which help "coat the throat" to relieve pain  such as ELM BARK, LICORICE ROOT, MARSHMALLOW ROOT  PRESCRIPTION TREATMENT FOR COUGH: Reserved for severe cases. Can include  pills, syrups or inhalers.    TREAT ACHES & PAINS, FEVER: . Illness causes aches, pains and fever as your body increases its natural inflammation response to help fight the infection.  . Rest, good hydration and nutrition, and taking anti-inflammatory medications will help.  . Remember: a true fever is a temperature 100.4 or higher. If you have a fever that is 105.0 or higher, that is a dangerous level and needs medical attention in the office or in the ER!    Can take both of these together... IBUPROFEN - 400-800 mg every 6 - 8 hours. Ibuprofen and similar medications can cause problems for people with heart disease or history of stomach ulcers, check with Dr A first if you're concerned. Lower doses are usually safe and effective.  TYLENOL (ACETAMINOPHEN) - [phone] mg every 6 hours. It won't  cause problems with heart or stomach.   TREAT GASTROINTESTINAL SYMPTOMS: . Hydrate, hydrate, hydrate! Try drinking Gatorade/Powerade, broth/soup. Avoid milk and juice, these can make diarrhea worse. You can drink water, of course, but if you are also having vomiting and loose stool you are losing electrolytes which water alone can't replace.   IMMODIUM (LOPERAMIDE) - as directed on the bottle to help with loose stool PRESCRIPTION ZOFRAN (ONDANSETRON) OR PHENERGAN (PROMETHAZINE) - as directed to help nausea and vomiting. Try taking these before you eat if you are having trouble keeping food down.     REMEMBER - THE MOST IMPORTANT THINGS YOU CAN DO TO AVOID CATCHING OR SPREADING ILLNESS INCLUDE:  . COVER YOUR COUGH WITH YOUR ARM, NOT WITH YOUR HANDS!  . DISINFECT COMMONLY USED SURFACES (SUCH AS TELEPHONES & DOORKNOBS) WHEN YOU OR SOMEONE CLOSE TO YOU IS FEELING SICK!  . BE SURE VACCINES ARE UP TO DATE - GET A FLU SHOT EVERY YEAR! . GOOD NUTRITION AND HEALTHY LIFESTYLE WILL HELP YOUR IMMUNE SYSTEM YEAR-ROUND! . AND ABOVE ALL - [place]!

## 2016-01-12 NOTE — Progress Notes (Signed)
HPI: Sarah Benjamin is a 45 y.o. female who presents to [place] today for chief complaint of:  Chief Complaint  Patient presents with  . Annual Exam  . Sinus Problem    COLD/SINUS . Location: ears, eyes, throat . Quality: soreness/congestion . Severity: severe . Duration: 3 days . Context: possible sick contacts at work, no flu shot this season . Modifying factors: taken Theraflu tea, on other meds, not helping . Assoc signs/symptoms: ?fever at home, headache  CHEST DISCOMFORT . Location: l side of chest radiating into L arm . Quality: soreness . Severity: mild-moderate . Duration: started last week and lasted few hours went away on its own, previous episode few years ago,  . Context: Records rviewed from ER 02/09/12: CP r/o ACS, Coronary CT neg w/ poss PFO . Assoc signs/symptoms: no dizziness/SOB   Past medical, social and family history reviewed: Past Medical History  Diagnosis Date  . Thyroid disease   . Anemia    No past surgical history on file. Social History  Substance Use Topics  . Smoking status: Never Smoker   . Smokeless tobacco: Not on file  . Alcohol Use: No   No family history on file.  Current Outpatient Prescriptions  Medication Sig Dispense Refill  . albuterol (PROVENTIL HFA;VENTOLIN HFA) 108 (90 BASE) MCG/ACT inhaler Inhale 2 puffs into the lungs.    Marland Kitchen levothyroxine (SYNTHROID, LEVOTHROID) 75 MCG tablet Take 1 tablet (75 mcg total) by mouth daily. 30 tablet 1  . montelukast (SINGULAIR) 10 MG tablet Take 10 mg by mouth.     No current facility-administered medications for this visit.   No Known Allergies    Review of Systems: CONSTITUTIONAL:  No  fever, no chills, No  unintentional weight changes HEAD/EYES/EARS/NOSE/THROAT: (+) headache, no vision change, no hearing change, (+) sore throat, (+) sinus pressure CARDIAC: (+) chest pain, No  pressure, No palpitations, No  orthopnea RESPIRATORY: (+) cough, No   shortness of breath/wheeze GASTROINTESTINAL: No  nausea, No  vomiting, No  abdominal pain, No  blood in stool, No  diarrhea, No  constipation  MUSCULOSKELETAL: (+) myalgia/arthralgia ENDOCRINE: No polyuria/polydipsia/polyphagia, No  heat/cold intolerance  NEUROLOGIC: No  weakness, No  dizziness  Exam:  BP 134/76 mmHg  Pulse 58  Temp(Src) 98.3 F (36.8 C) (Oral)  Ht 5\' 2"  (1.575 m)  Wt 205 lb (92.987 kg)  BMI 37.49 kg/m2 Constitutional: VS see above. General Appearance: alert, well-developed, well-nourished, NAD Eyes: Normal lids and conjunctive, non-icteric sclera, PERRLA Ears, Nose, Mouth, Throat: MMM, Normal external inspection ears/nares/mouth/lips/gums, TM normal bilaterally. Pharynx (+) erythema, no exudate.  Neck: No masses, trachea midline. No thyroid enlargement/tenderness/mass appreciated. No lymphadenopathy Respiratory: Normal respiratory effort. no wheeze, no rhonchi, no rales Cardiovascular: S1/S2 normal, no murmur, no rub/gallop auscultated. RRR. No lower extremity edema. Gastrointestinal: Nontender, no masses.  Msk: (+) TTP L upper ribs, normal nonpainful ROM L shoulder  No results found for this or any previous visit (from the past 72 hour(s)).  EKG interpretation: Rate: 64 Rhythm: sinus No ST/T changes concerning for acute ischemia/infarct    ASSESSMENT/PLAN: hold off on annual exam for now to address viral respiratory illness and chest discomfort, labs today and come back for annual physical  Viral URI - Plan: CBC with Differential/Platelet, benzonatate (TESSALON) 200 MG capsule, ipratropium (ATROVENT) 0.03 % nasal spray  Hypothyroidism, unspecified hypothyroidism type - Plan: TSH  Lipid screening - Plan: Lipid panel  Diabetes mellitus screening - Plan: COMPLETE METABOLIC PANEL WITH GFR  Medication management - Plan: CBC with Differential/Platelet, COMPLETE METABOLIC PANEL WITH GFR  Chest pain, unspecified chest pain type - Coronary CT neg 2013, ER ACS r/o  at that time, pt never followed up with cardio because was feeling better - Plan: COMPLETE METABOLIC PANEL WITH GFR, Magnesium, EKG 12-Lead. Consideration for viral prodrome ve costochondritis, NSAID advised, ER/RTC precautions reviewed.   PFO (patent foramen ovale) - noted as possible PFO on Coronary CT in 2013, no murmur on auscultation, pt never followed up w/ cardio after CP w/u  Return in about 1 week (around 01/19/2016), or sooner if needed, for ANNUAL PHYSICAL, REVIEW LABS.

## 2016-02-02 ENCOUNTER — Other Ambulatory Visit: Payer: Self-pay | Admitting: Osteopathic Medicine

## 2016-04-15 ENCOUNTER — Emergency Department (INDEPENDENT_AMBULATORY_CARE_PROVIDER_SITE_OTHER)
Admission: EM | Admit: 2016-04-15 | Discharge: 2016-04-15 | Disposition: A | Discharge status: Home / Self Care | Payer: BLUE CROSS/BLUE SHIELD | Source: Home / Self Care | Attending: Family Medicine | Admitting: Family Medicine

## 2016-04-15 DIAGNOSIS — R200 Anesthesia of skin: Secondary | ICD-10-CM

## 2016-04-15 DIAGNOSIS — R42 Dizziness and giddiness: Secondary | ICD-10-CM

## 2016-04-15 DIAGNOSIS — R208 Other disturbances of skin sensation: Secondary | ICD-10-CM | POA: Diagnosis not present

## 2016-04-15 DIAGNOSIS — R110 Nausea: Secondary | ICD-10-CM | POA: Diagnosis not present

## 2016-04-15 MED ORDER — ONDANSETRON 4 MG PO TBDP
4.0000 mg | ORAL_TABLET | Freq: Once | ORAL | Status: AC
  Administered 2016-04-15: 4 mg via ORAL

## 2016-04-15 MED ORDER — ACETAMINOPHEN 325 MG PO TABS
975.0000 mg | ORAL_TABLET | Freq: Once | ORAL | Status: AC
  Administered 2016-04-15: 975 mg via ORAL

## 2016-04-15 MED ORDER — ACETAMINOPHEN 500 MG PO TABS: 1000.0000 mg | ORAL_TABLET | Freq: Once | ORAL | Status: DC

## 2016-04-15 MED ORDER — CETIRIZINE HCL 10 MG PO CAPS: 1.0000 | ORAL_CAPSULE | Freq: Every day | ORAL | Status: DC

## 2016-04-15 MED ORDER — DEXAMETHASONE SODIUM PHOSPHATE 10 MG/ML IJ SOLN
10.0000 mg | Freq: Once | INTRAMUSCULAR | Status: AC
  Administered 2016-04-15: 10 mg via INTRAMUSCULAR

## 2016-04-15 MED ORDER — PREDNISONE 20 MG PO TABS: ORAL_TABLET | ORAL | Status: DC

## 2016-04-15 NOTE — ED Provider Notes (Signed)
CSN: [id number]     Arrival date & time 04/15/16  1715 History   First MD Initiated Contact with Patient 04/15/16 1728     Chief Complaint  Patient presents with  . Dizziness   (Consider location/radiation/quality/duration/timing/severity/associated sxs/prior Treatment) HPI  The pt is a 45yo female presenting to [facility] with c/o dizziness described as lightheadedness and nausea with occasional facial twitching and bilateral facial numbness for about 4 days.  Twitching is worse between her eyes above the bridge of her nose.  Mild aching aching that is intermittent and mild intermittent lip numbness.  She reports taking various allergy medication over the last several weeks including Allegra D, Zyrtec, and most recently Tylenol cold and sinus. She is unsure if that is what is causing her symptoms. Denies hx of migraines but does have hx of Bell's Palsy about 15 years ago. These symptoms feel different as she does not feel like she has weakness in her face and has not noticed any facial drooping.  Denies fever, chills, SOB, palpitations or chest pain.   Past Medical History  Diagnosis Date  . Thyroid disease   . Anemia    History reviewed. No pertinent past surgical history. History reviewed. No pertinent family history. Social History  Substance Use Topics  . Smoking status: Never Smoker   . Smokeless tobacco: None  . Alcohol Use: No   OB History    No data available     Review of Systems  Constitutional: Negative for fever and chills.  HENT: Positive for congestion and sinus pressure. Negative for ear pain, sore throat, trouble swallowing and voice change.   Eyes: Negative for photophobia and visual disturbance.  Respiratory: Negative for cough and shortness of breath.   Cardiovascular: Negative for chest pain and palpitations.  Gastrointestinal: Positive for nausea. Negative for vomiting, abdominal pain and diarrhea.  Musculoskeletal: Negative for myalgias, back pain and arthralgias.   Skin: Negative for rash.  Neurological: Positive for dizziness, light-headedness, numbness and headaches. Negative for syncope, facial asymmetry, speech difficulty and weakness.    Allergies  Review of patient's allergies indicates no known allergies.  Home Medications   Prior to Admission medications   Medication Sig Start Date End Date Taking? Authorizing Provider  albuterol (PROVENTIL HFA;VENTOLIN HFA) 108 (90 BASE) MCG/ACT inhaler Inhale 2 puffs into the lungs. 02/18/15 02/18/16  Historical Provider, MD  benzonatate (TESSALON) 200 MG capsule Take 1 capsule (200 mg total) by mouth 3 (three) times daily as needed for cough. 01/12/16   Emeterio Reeve, DO  Cetirizine HCl 10 MG CAPS Take 1 capsule (10 mg total) by mouth daily. 04/15/16   Noland Fordyce, PA-C  ipratropium (ATROVENT) 0.03 % nasal spray Place 2 sprays into both nostrils 3 (three) times daily. When sinus congestion present 01/12/16   Emeterio Reeve, DO  levothyroxine (SYNTHROID, LEVOTHROID) 75 MCG tablet TAKE 1 TABLET BY MOUTH DAILY 02/03/16   Emeterio Reeve, DO  montelukast (SINGULAIR) 10 MG tablet Take 10 mg by mouth. 02/18/15 02/18/16  Historical Provider, MD  predniSONE (DELTASONE) 20 MG tablet 3 tabs po day one, then 2 po daily x 4 days 04/15/16   Noland Fordyce, PA-C   Meds Ordered and Administered this Visit   Medications  dexamethasone (DECADRON) injection 10 mg (10 mg Intramuscular Given 04/15/16 1755)  ondansetron (ZOFRAN-ODT) disintegrating tablet 4 mg (4 mg Oral Given 04/15/16 1755)  acetaminophen (TYLENOL) tablet 975 mg (975 mg Oral Given 04/15/16 1755)    BP 133/82 mmHg  Pulse 65  Temp(Src)  98.1 F (36.7 C) (Oral)  Ht 5\' 2"  (1.575 m)  Wt 206 lb 8 oz (93.668 kg)  BMI 37.76 kg/m2  SpO2 97%  LMP 04/03/2016 No data found.   Physical Exam  Constitutional: She is oriented to person, place, and time. She appears well-developed and well-nourished. No distress.  HENT:  Head: Normocephalic and atraumatic.  Right Ear:  Tympanic membrane normal.  Left Ear: Tympanic membrane normal.  Nose: Nose normal.  Mouth/Throat: Uvula is midline, oropharynx is clear and moist and mucous membranes are normal.  Eyes: Conjunctivae and EOM are normal. Pupils are equal, round, and reactive to light. No scleral icterus.  Neck: Normal range of motion. Neck supple.  Cardiovascular: Normal rate, regular rhythm and normal heart sounds.   Pulmonary/Chest: Effort normal and breath sounds normal. No respiratory distress. She has no wheezes. She has no rales. She exhibits no tenderness.  Abdominal: Soft. Bowel sounds are normal. She exhibits no distension and no mass. There is no tenderness. There is no rebound and no guarding.  Musculoskeletal: Normal range of motion.  Neurological: She is alert and oriented to person, place, and time. She has normal strength. No cranial nerve deficit or sensory deficit. She displays a negative Romberg sign. Coordination and gait normal. GCS eye subscore is 4. GCS verbal subscore is 5. GCS motor subscore is 6.  CN II-XII in tact. Speech is clear. Alert to person place and time. Normal sensation on Left and Right side of face. No facial droop. Normal gait.   Skin: Skin is warm and dry. She is not diaphoretic.  Nursing note and vitals reviewed.   ED Course  Procedures (including critical care time)  Labs Review Labs Reviewed - No data to display  Imaging Review No results found.   MDM   1. Facial numbness   2. Dizziness   3. Nausea without vomiting    Medical records reviewed. Hx of thyroid disease, last TSH 4 months ago WNL.  Vitals: elevated BP at 159/97, otherwise WNL  No focal neuro deficit. Possible migraine variant.  Doubt SAH, temporal arteritis given pt's age and no temporal artery tenderness. Doubt CVA or TIA.  No evidence of anaphylaxis or angioedema.  Tx in UC: Decadron 10mg  IM, zofran 4mg  ODT and acetaminophe 975mg  PO  Pt states symptoms have improved moderately.  Still no  focal neuro deficit. BP improved from 15 to 133/82.  Pt feels comfortable being discharged home. Encouraged to discontinue multisymptom Tylenol Cold/Sinus medication.  Rx: Prednisone and zyrtec. Discussed symptoms that warrant emergent care in the ED. F/u with PCP in 1 week if not improving. \   Noland Fordyce, PA-C 04/15/16 1831

## 2016-04-15 NOTE — ED Notes (Signed)
Pt is feeling dizzy and nauseated.  She has had twitching of the face above and between the eyes for 4 days.  Lips feel numb, and face feels prickely.  Complaining of a headache, however when asked to rate pain, it is 0.

## 2016-04-19 ENCOUNTER — Ambulatory Visit (INDEPENDENT_AMBULATORY_CARE_PROVIDER_SITE_OTHER): Payer: BLUE CROSS/BLUE SHIELD | Admitting: Osteopathic Medicine

## 2016-04-19 ENCOUNTER — Encounter: Payer: Self-pay | Admitting: Osteopathic Medicine

## 2016-04-19 VITALS — BP 128/79 | HR 71 | Ht 62.0 in | Wt 206.0 lb

## 2016-04-19 DIAGNOSIS — R51 Headache: Secondary | ICD-10-CM | POA: Diagnosis not present

## 2016-04-19 DIAGNOSIS — R42 Dizziness and giddiness: Secondary | ICD-10-CM | POA: Diagnosis not present

## 2016-04-19 DIAGNOSIS — J302 Other seasonal allergic rhinitis: Secondary | ICD-10-CM | POA: Diagnosis not present

## 2016-04-19 DIAGNOSIS — B9689 Other specified bacterial agents as the cause of diseases classified elsewhere: Secondary | ICD-10-CM

## 2016-04-19 DIAGNOSIS — E039 Hypothyroidism, unspecified: Secondary | ICD-10-CM | POA: Diagnosis not present

## 2016-04-19 DIAGNOSIS — A499 Bacterial infection, unspecified: Secondary | ICD-10-CM

## 2016-04-19 DIAGNOSIS — J329 Chronic sinusitis, unspecified: Secondary | ICD-10-CM

## 2016-04-19 DIAGNOSIS — Z8669 Personal history of other diseases of the nervous system and sense organs: Secondary | ICD-10-CM | POA: Insufficient documentation

## 2016-04-19 DIAGNOSIS — R519 Headache, unspecified: Secondary | ICD-10-CM | POA: Insufficient documentation

## 2016-04-19 MED ORDER — FLUTICASONE PROPIONATE 50 MCG/ACT NA SUSP: 2.0000 | Freq: Every day | NASAL | Status: DC

## 2016-04-19 NOTE — Progress Notes (Signed)
HPI: Sarah Benjamin is a 45 y.o. female who presents to [place] today for chief complaint of:  Chief Complaint  Patient presents with  . Dizziness    Seen in Urgent Care for dizziness, headaches, nasuea and numbness and tingling in the face     . Context: seen in UC Fri, Fri night headache and took Tylenol, got up with headache again. Saturday woke up with neck and shoulder/arm pain/soreness. Feeling dizzy/nauseous maybe 3 -4 times per day at random. See below.  . Modifying factors: UC gave steroids, tylenol, zofran. Tylenol at home. No allergy meds.  . Assoc signs/symptoms: sweating, dizzy (not provoked by movement, no vision change or LOC), nausea (along with dizziness) . Headache both sides frontal, comes nd goes, nothing makes worse or better. Feels twitching in face on occasion. Had tingling in hands and mouth last week but when asked if she was hyperventilating at the time she says she thinks she might have been because of nervousness.  . Severity: Better since Friday but persistent  . Duration: 7 days ago first started.  . Timing: intermittent, random   Past medical, social and family history reviewed: Past Medical History  Diagnosis Date  . Thyroid disease   . Anemia    No past surgical history on file. Social History  Substance Use Topics  . Smoking status: Never Smoker   . Smokeless tobacco: Not on file  . Alcohol Use: No   No family history on file.  Current Outpatient Prescriptions  Medication Sig Dispense Refill  . Cetirizine HCl 10 MG CAPS Take 1 capsule (10 mg total) by mouth daily. 30 capsule 0  . ipratropium (ATROVENT) 0.03 % nasal spray Place 2 sprays into both nostrils 3 (three) times daily. When sinus congestion present 30 mL 0  . levothyroxine (SYNTHROID, LEVOTHROID) 75 MCG tablet TAKE 1 TABLET BY MOUTH DAILY 30 tablet 0  . albuterol (PROVENTIL HFA;VENTOLIN HFA) 108 (90 BASE) MCG/ACT inhaler Inhale 2 puffs into the lungs.     . montelukast (SINGULAIR) 10 MG tablet Take 10 mg by mouth.     No current facility-administered medications for this visit.   No Known Allergies    Review of Systems: CONSTITUTIONAL:  No  fever, no chills, No recent illness, No unintentional weight changes HEAD/EYES/EARS/NOSE/THROAT: (+) frontal/maxillary headache, no vision change, no hearing change, No sore throat, (+) sinus pressure, (+) seasonal allergies not on any meds  CARDIAC: No  chest pain, No  pressure, No palpitations RESPIRATORY: No  cough, No  shortness of breath/wheeze GASTROINTESTINAL: (+) nausea, No  vomiting, No  abdominal pain, No  blood in stool, No  diarrhea, (+) occasional constipation  MUSCULOSKELETAL: (+) lower back, upper back and shoulders myalgia/arthralgia GENITOURINARY: No  incontinence SKIN: No  rash/wounds/concerning lesions NEUROLOGIC: No  weakness, (+) dizziness, No  slurred speech PSYCHIATRIC: No  concerns with depression, No  concerns with anxiety, No sleep problems  Exam:  BP 128/79 mmHg  Pulse 71  Ht 5\' 2"  (1.575 m)  Wt 206 lb (93.441 kg)  BMI 37.67 kg/m2  LMP 04/03/2016 Constitutional: VS see above. General Appearance: alert, well-developed, well-nourished, NAD Eyes: Normal lids and conjunctive, non-icteric sclera, PERRLA, EOMI Ears, Nose, Mouth, Throat: MMM, Normal external inspection ears/nares/mouth/lips/gums, TM normal bilaterally. Pharynx/tonsils no erythema, no exudate. Nasal mucosa normal. Normal neck ROM.  Neck: No masses, trachea midline. No thyroid enlargement. No tenderness/mass appreciated. No lymphadenopathy Respiratory: Normal respiratory effort. no wheeze, no rhonchi, no rales Cardiovascular: S1/S2 normal,  no murmur, no rub/gallop auscultated. RRR. No lower extremity edema. Gastrointestinal: Nontender, no masses. No hepatomegaly, no splenomegaly. No hernia appreciated. Bowel sounds normal. Rectal exam deferred.  Musculoskeletal: Gait normal. No clubbing/cyanosis of digits.   Neurological: No cranial nerve deficit on limited exam. Motor and sensation intact and symmetric. Cerebellar reflexes intact.  Skin: warm, dry, intact. No rash/ulcer.  Psychiatric: Normal judgment/insight. Normal mood and affect. Oriented x3.    No results found for this or any previous visit (from the past 72 hour(s)).  No results found.   ASSESSMENT/PLAN: Suspect sinus or thyroid issue, will check lytes as well as CBC, TSH. If all negative consider treat as bacterial sinusitis given sinus pain and mild dizziness, if that doesn't work consider imaging/referral if persistent symptoms.   Dizziness - not consistent with BPPV, possible eustacian tube dysfunction due to sinusitis, possible involvement of thyroid very low suspicion for CVA, precautions reviewed - Plan: CBC with Differential/Platelet, COMPLETE METABOLIC PANEL WITH GFR  Hypothyroidism, unspecified hypothyroidism type - Plan: TSH  Seasonal allergies - flonase started, hopefully if eustacian tube dysfunction will help treat this an aleviate dizziness/nausea.  - Plan: fluticasone (FLONASE) 50 MCG/ACT nasal spray  Sinus headache - Tylenol, Sudafed, Flonase. Consider treat with abx if all labs negative.   History of Bell's palsy - affected L side of face, very mild residual effects   All questions were answered. Visit summary with medication list and pertinent instructions was printed for patient to review. ER/RTC precautions were extensively reviewed with the patient. Return if symptoms worsen or fail to improve in 1 week.

## 2016-04-19 NOTE — Patient Instructions (Signed)
If severe headache, weakness or concerns for stroke - go to ER right away.  We will call you tomorrow with lab results and we will decide at that point whether you need to try antibiotics for sinus infection.  Can take Tylenol for pain at home. You can add Sudafed decongestant for sinus pain.  Any questions, call us!

## 2016-04-20 LAB — COMPLETE METABOLIC PANEL WITH GFR
ALT: 45 U/L — ABNORMAL HIGH (ref 6–29)
AST: 25 U/L (ref 10–30)
Albumin: 4.2 g/dL (ref 3.6–5.1)
Alkaline Phosphatase: 76 U/L (ref 33–115)
BUN: 13 mg/dL (ref 7–25)
CO2: 26 mmol/L (ref 20–31)
Calcium: 9.3 mg/dL (ref 8.6–10.2)
Chloride: 101 mmol/L (ref 98–110)
Creat: 0.66 mg/dL (ref 0.50–1.10)
GFR, Est African American: >89 mL/min (ref >=60)
GFR, Est Non African American: >89 mL/min (ref >=60)
Glucose, Bld: 87 mg/dL (ref 65–99)
Potassium: 4.1 mmol/L (ref 3.5–5.3)
Sodium: 136 mmol/L (ref 135–146)
Total Bilirubin: 0.6 mg/dL (ref 0.2–1.2)
Total Protein: 6.7 g/dL (ref 6.1–8.1)

## 2016-04-20 LAB — CBC WITH DIFFERENTIAL/PLATELET
Basophils Absolute: 85 cells/uL (ref 0–200)
Basophils Relative: 1 %
Eosinophils Absolute: 255 cells/uL (ref 15–500)
Eosinophils Relative: 3 %
HCT: 43.4 % (ref 35.0–45.0)
Hemoglobin: 14.3 g/dL (ref 11.7–15.5)
Lymphocytes Relative: 32 %
Lymphs Abs: 2720 cells/uL (ref 850–3900)
MCH: 29.7 pg (ref 27.0–33.0)
MCHC: 32.9 g/dL (ref 32.0–36.0)
MCV: 90.2 fL (ref 80.0–100.0)
MPV: 13.3 fL — ABNORMAL HIGH (ref 7.5–12.5)
Monocytes Absolute: 510 cells/uL (ref 200–950)
Monocytes Relative: 6 %
Neutro Abs: 4930 cells/uL (ref 1500–7800)
Neutrophils Relative %: 58 %
Platelets: 224 10*3/uL (ref 140–400)
RBC: 4.81 MIL/uL (ref 3.80–5.10)
RDW: 14.7 % (ref 11.0–15.0)
WBC: 8.5 10*3/uL (ref 3.8–10.8)

## 2016-04-20 LAB — TSH: TSH: 2.97 mIU/L

## 2016-04-20 MED ORDER — AMOXICILLIN-POT CLAVULANATE 875-125 MG PO TABS: 1.0000 | ORAL_TABLET | Freq: Two times a day (BID) | ORAL | Status: DC

## 2016-04-20 NOTE — Addendum Note (Signed)
Addended by: Maryla Morrow on: 04/20/2016 10:16 AM   Modules accepted: Orders

## 2016-04-26 ENCOUNTER — Other Ambulatory Visit: Payer: Self-pay | Admitting: Osteopathic Medicine

## 2016-04-27 ENCOUNTER — Other Ambulatory Visit: Payer: Self-pay | Admitting: *Deleted

## 2016-04-27 MED ORDER — LEVOTHYROXINE SODIUM 75 MCG PO TABS: 75.0000 ug | ORAL_TABLET | Freq: Every day | ORAL | Status: DC

## 2016-07-27 ENCOUNTER — Encounter: Payer: Self-pay | Admitting: Osteopathic Medicine

## 2016-07-27 ENCOUNTER — Ambulatory Visit (INDEPENDENT_AMBULATORY_CARE_PROVIDER_SITE_OTHER): Payer: BLUE CROSS/BLUE SHIELD

## 2016-07-27 ENCOUNTER — Ambulatory Visit (INDEPENDENT_AMBULATORY_CARE_PROVIDER_SITE_OTHER): Payer: BLUE CROSS/BLUE SHIELD | Admitting: Osteopathic Medicine

## 2016-07-27 VITALS — BP 144/88 | HR 60 | Temp 98.3°F | Ht 62.0 in | Wt 208.0 lb

## 2016-07-27 DIAGNOSIS — R001 Bradycardia, unspecified: Secondary | ICD-10-CM | POA: Diagnosis not present

## 2016-07-27 DIAGNOSIS — J321 Chronic frontal sinusitis: Secondary | ICD-10-CM | POA: Diagnosis not present

## 2016-07-27 DIAGNOSIS — J323 Chronic sphenoidal sinusitis: Secondary | ICD-10-CM

## 2016-07-27 DIAGNOSIS — R51 Headache: Secondary | ICD-10-CM

## 2016-07-27 DIAGNOSIS — J0110 Acute frontal sinusitis, unspecified: Secondary | ICD-10-CM | POA: Diagnosis not present

## 2016-07-27 DIAGNOSIS — J0130 Acute sphenoidal sinusitis, unspecified: Secondary | ICD-10-CM | POA: Diagnosis not present

## 2016-07-27 DIAGNOSIS — R519 Headache, unspecified: Secondary | ICD-10-CM

## 2016-07-27 MED ORDER — BUTALBITAL-APAP-CAFFEINE 50-325-40 MG PO TABS: 1.0000 | ORAL_TABLET | Freq: Two times a day (BID) | ORAL | 0 refills | Status: DC | PRN

## 2016-07-27 MED ORDER — AMOXICILLIN-POT CLAVULANATE 875-125 MG PO TABS: 1.0000 | ORAL_TABLET | Freq: Two times a day (BID) | ORAL | 0 refills | Status: DC

## 2016-07-27 NOTE — Progress Notes (Signed)
HPI: Sarah Benjamin is a 45 y.o. female  who presents to [place] today, 07/27/16,  for chief complaint of:  Chief Complaint  Patient presents with  . Headache    . Context: no injury . Location: L frontal and around to back of head and into L shoulder  . Quality: throbbing/sharp . Duration: 7-10 days . Timing: occasionally worse but always present.  . Modifying factors: taking sinus and headache OTC medications (Excedrin Migraine, tension headache, Tylenol, Ibuprofen) . Assoc signs/symptoms: No sinus congestion. No fever. Occasional dizziness with standing. BP was high in the drug store (140/80). Chest pain for 3 days, worse when the headache is stronger. Pain is waking her at night.    Past medical, surgical, social and family history reviewed: Past Medical History:  Diagnosis Date  . Anemia   . Thyroid disease    No past surgical history on file. Social History  Substance Use Topics  . Smoking status: Never Smoker  . Smokeless tobacco: Not on file  . Alcohol use No   No family history on file.   Current medication list and allergy/intolerance information reviewed:   Current Outpatient Prescriptions  Medication Sig Dispense Refill  . fluticasone (FLONASE) 50 MCG/ACT nasal spray Place 2 sprays into both nostrils daily. 16 g 3  . levothyroxine (SYNTHROID, LEVOTHROID) 75 MCG tablet Take 1 tablet (75 mcg total) by mouth daily. 30 tablet 6   No current facility-administered medications for this visit.    No Known Allergies    Review of Systems:  Constitutional:  No  fever, no chills, No recent illness   HEENT: (+)headache, no vision change, no hearing change, No sore throat, No  sinus pressure  Cardiac: No  chest pain, (+)pressure, No palpitations,  Respiratory:  No  shortness of breath. No  Cough  Gastrointestinal: No  abdominal pain, (+)nausea, No  vomiting  Musculoskeletal: No new myalgia/arthralgia  Skin: No   Rash  Neurologic: No  weakness, (+)dizziness, No  slurred speech/focal weakness/facial droop   Exam:  BP (!) 144/88   Pulse 60   Ht 5\' 2"  (1.575 m)   Wt 208 lb (94.3 kg)   BMI 38.04 kg/m   Constitutional: VS see above. General Appearance: alert, well-developed, well-nourished, NAD  Eyes: Normal lids and conjunctive, non-icteric sclera  Ears, Nose, Mouth, Throat: MMM, Normal external inspection ears/nares/mouth/lips/gums. TM normal bilaterally. Pharynx/tonsils no erythema, no exudate. Nasal mucosa normal.   Neck: No masses, trachea midline. No thyroid enlargement. No tenderness/mass appreciated. No lymphadenopathy. Normal ROM  Respiratory: Normal respiratory effort. no wheeze, no rhonchi, no rales  Cardiovascular: S1/S2 normal, no murmur, no rub/gallop auscultated. RRR.   Musculoskeletal: Gait normal.  Neurological: No cranial nerve deficit on limited exam. Motor and sensation intact and symmetric. Cerebellar reflexes intact. Normal balance/coordination. No tremor. Funduscopic exam appears normal but limited due to nondilated   Skin: warm, dry, intact. No rash/ulcer.   Psychiatric: Normal judgment/insight. Normal mood and affect. Oriented x3.    Ct Head Wo Contrast  Result Date: 07/27/2016 CLINICAL DATA:  Intractable headaches over the last 7-10 days. EXAM: CT HEAD WITHOUT CONTRAST TECHNIQUE: Contiguous axial images were obtained from the base of the skull through the vertex without intravenous contrast. COMPARISON:  CT head without contrast 07/07/2009 FINDINGS: Brain: No acute infarct, hemorrhage, or mass lesion is present. The ventricles are of normal size. No significant extraaxial fluid collection is present. Vascular: Negative Skull: The calvarium is intact. Sinuses/Orbits: Mucosal thickening and a fluid  level or present in the left sphenoid sinus. There is mucosal thickening along the right frontal ethmoidal recess. The remaining paranasal sinuses and the mastoid air cells  are clear. Other: IMPRESSION: 1. Normal CT appearance the brain. 2. Left sphenoid and right frontal sinus disease as described. Electronically Signed   By: San Morelle M.D.   On: 07/27/2016 11:19   EKG interpretation: Rate: 58 Rhythm: sinus No ST/T changes concerning for acute ischemia/infarct    ASSESSMENT/PLAN:   Description/location of pain on frontal, side of head, down back and into the shoulder more consistent with musculoskeletal/tension headache, however concerning due to nighttime awakening, patient reports photosensitivity/occasional blurry vision in the left eye, dizziness. CT negative, except for sinusitis. Patient declined Toradol injection here in the office.   Trial oral medications as below, ER/RTC precautions were reviewed. Treat sinusitis as well. If no better, would consider MRI/neurology referral  Acute intractable headache, unspecified headache type - Plan: CT HEAD WO CONTRAST, butalbital-acetaminophen-caffeine (ESGIC) 50-325-40 MG tablet  Acute sphenoidal sinusitis, recurrence not specified  Acute frontal sinusitis, recurrence not specified   Patient Instructions  Your symptoms are more consistent with a severe tension headache or viral illness, however you do have some concerning symptoms such as headache waking up at night & concern for vision changes. I think we should do a CT scan of the brain today, we can try some medication treatment as long as the CT is looking normal. You have been given a prescription for headache medicine to take as needed.  If the medications are not helping, or if your headache persists/gets worse, we may need to do further imaging with MRI and/or refer to a neurologist.   If you have severe worsening headache, particularly if fever comes back, if you develop a rash or pain with neck movement, if your arm or other part of the body becomes weak or you experience speech changes or other concerns, these are reasons to go to an  emergency room right away.   Please contact us with any questions or concerns!   Visit summary with medication list and pertinent instructions was printed for patient to review. All questions at time of visit were answered - patient instructed to contact office with any additional concerns. ER/RTC precautions were reviewed with the patient. Follow-up plan: Return if symptoms worsen or fail to improve.

## 2016-07-27 NOTE — Patient Instructions (Signed)
Your symptoms are more consistent with a severe tension headache or viral illness, however you do have some concerning symptoms such as headache waking up at night & concern for vision changes. I think we should do a CT scan of the brain today, we can try some medication treatment as long as the CT is looking normal. You have been given a prescription for headache medicine to take as needed.  If the medications are not helping, or if your headache persists/gets worse, we may need to do further imaging with MRI and/or refer to a neurologist.   If you have severe worsening headache, particularly if fever comes back, if you develop a rash or pain with neck movement, if your arm or other part of the body becomes weak or you experience speech changes or other concerns, these are reasons to go to an emergency room right away.   Please contact us with any questions or concerns!

## 2016-07-29 NOTE — Addendum Note (Signed)
Addended by: Terance Hart on: 07/29/2016 09:46 AM   Modules accepted: Orders

## 2016-08-04 DIAGNOSIS — E079 Disorder of thyroid, unspecified: Secondary | ICD-10-CM | POA: Diagnosis not present

## 2016-08-04 DIAGNOSIS — Z79899 Other long term (current) drug therapy: Secondary | ICD-10-CM | POA: Diagnosis not present

## 2016-08-04 DIAGNOSIS — R51 Headache: Secondary | ICD-10-CM | POA: Diagnosis not present

## 2016-08-04 DIAGNOSIS — G44209 Tension-type headache, unspecified, not intractable: Secondary | ICD-10-CM | POA: Diagnosis not present

## 2016-08-04 DIAGNOSIS — H538 Other visual disturbances: Secondary | ICD-10-CM | POA: Diagnosis not present

## 2016-08-04 DIAGNOSIS — J45909 Unspecified asthma, uncomplicated: Secondary | ICD-10-CM | POA: Diagnosis not present

## 2016-08-04 DIAGNOSIS — J328 Other chronic sinusitis: Secondary | ICD-10-CM | POA: Diagnosis not present

## 2016-08-04 DIAGNOSIS — J329 Chronic sinusitis, unspecified: Secondary | ICD-10-CM | POA: Diagnosis not present

## 2016-10-24 DIAGNOSIS — Z1231 Encounter for screening mammogram for malignant neoplasm of breast: Secondary | ICD-10-CM | POA: Diagnosis not present

## 2016-10-26 LAB — HM MAMMOGRAPHY

## 2017-02-08 ENCOUNTER — Other Ambulatory Visit: Payer: Self-pay | Admitting: Osteopathic Medicine

## 2017-02-16 ENCOUNTER — Ambulatory Visit (INDEPENDENT_AMBULATORY_CARE_PROVIDER_SITE_OTHER): Payer: BLUE CROSS/BLUE SHIELD | Admitting: Osteopathic Medicine

## 2017-02-16 ENCOUNTER — Encounter: Payer: Self-pay | Admitting: Osteopathic Medicine

## 2017-02-16 VITALS — BP 135/85 | HR 67 | Temp 98.0°F | Wt 203.0 lb

## 2017-02-16 DIAGNOSIS — J302 Other seasonal allergic rhinitis: Secondary | ICD-10-CM | POA: Diagnosis not present

## 2017-02-16 DIAGNOSIS — H8190 Unspecified disorder of vestibular function, unspecified ear: Secondary | ICD-10-CM

## 2017-02-16 DIAGNOSIS — R42 Dizziness and giddiness: Secondary | ICD-10-CM

## 2017-02-16 DIAGNOSIS — R0789 Other chest pain: Secondary | ICD-10-CM

## 2017-02-16 MED ORDER — FLUTICASONE PROPIONATE 50 MCG/ACT NA SUSP: 2.0000 | Freq: Every day | NASAL | 3 refills | Status: DC

## 2017-02-16 MED ORDER — MELOXICAM 7.5 MG PO TABS: 7.5000 mg | ORAL_TABLET | Freq: Every day | ORAL | 0 refills | Status: DC

## 2017-02-16 NOTE — Patient Instructions (Signed)
Plan: 1. Exam today indicates most likely inner ear problem which explains dizziness/nausea, this is known as vertigo. See extra printed information for more detail on this and how to perform the maneuvers at home to help with the dizziness. 2. Lab work today to rule out other causes of ear symptoms and to recheck thyroid levels. 3. Prescription was sent for nasal spray to help with sinus congestion/allergy symptoms. Use this daily as directed, would give it at least a few days to a week before you start to notice great improvement in sinus congestion but continue after that. Can add over-the-counter Claritin or Zyrtec to this. 4. Separate visit will be needed to further address back pain, please schedule a follow-up for this. I have sent an anti-inflammatory to use for back pain as needed in the meantime. 5. Plan to follow-up in one to 2 weeks to recheck dizziness symptoms, please see [place] sooner or seek emergency medical care if any worsening or change to your symptoms.

## 2017-02-16 NOTE — Progress Notes (Signed)
HPI: Sarah Benjamin is a 46 y.o. female  who presents to [place] today, 02/16/17,  for chief complaint of:  Chief Complaint  Patient presents with  . Allergies   Patient is concerned today for allergies or thyroid problem as possible cause of dizziness and nausea. Has been going on about 2 weeks altogether with sinus congestion worsening over the past 2 days or so. Associated symptoms include occasional chest twinges with dizziness episodes, no palpitations or chest tightness. Patient has some lightheadedness with standing up and with turning to the right side. Occasional nausea but no vomiting or abdominal pain.   Past medical history, surgical history, social history and family history reviewed.  Patient Active Problem List   Diagnosis Date Noted  . Dizziness 04/19/2016  . Hypothyroid 04/19/2016  . Seasonal allergies 04/19/2016  . Sinus headache 04/19/2016  . History of Bell's palsy 04/19/2016  . PFO (patent foramen ovale) 01/12/2016  . Pain in the chest 01/12/2016    Current medication list and allergy/intolerance information reviewed.   Current Outpatient Prescriptions on File Prior to Visit  Medication Sig Dispense Refill  . amoxicillin-clavulanate (AUGMENTIN) 875-125 MG tablet Take 1 tablet by mouth 2 (two) times daily. For 1 week 14 tablet 0  . butalbital-acetaminophen-caffeine (ESGIC) 50-325-40 MG tablet Take 1-2 tablets by mouth 2 (two) times daily as needed for headache. Do not take more than 2 days per week. 14 tablet 0  . fluticasone (FLONASE) 50 MCG/ACT nasal spray Place 2 sprays into both nostrils daily. 16 g 3  . levothyroxine (SYNTHROID, LEVOTHROID) 75 MCG tablet TAKE 1 TABLET(75 MCG) BY MOUTH DAILY 30 tablet 0   No current facility-administered medications on file prior to visit.    No Known Allergies    Review of Systems:  Constitutional: No recent illness  HEENT: No  headache, no vision change  Cardiac: No  chest pain,  No  pressure, No palpitations  Respiratory:  No  shortness of breath. No  Cough  Gastrointestinal: No  abdominal pain, no change on bowel habits  Musculoskeletal: No new myalgia/arthralgia  Skin: No  Rash  Hem/Onc: No  easy bruising/bleeding, No  abnormal lumps/bumps  Neurologic: No  weakness, No  Dizziness  Psychiatric: No  concerns with depression, No  concerns with anxiety  Exam:  BP 135/85   Pulse 67   Temp 98 F (36.7 C) (Oral)   Wt 203 lb (92.1 kg)   BMI 37.13 kg/m   Orthostatic VS for the past 24 hrs:  BP- Lying Pulse- Lying BP- Sitting Pulse- Sitting  02/16/17 1135 127/79 66 137/90 66    Constitutional: VS see above. General Appearance: alert, well-developed, well-nourished, NAD  Eyes: Normal lids and conjunctive, non-icteric sclera  Ears, Nose, Mouth, Throat: MMM, Normal external inspection ears/nares/mouth/lips/gums.  Neck: No masses, trachea midline.   Respiratory: Normal respiratory effort. no wheeze, no rhonchi, no rales  Cardiovascular: S1/S2 normal, no murmur, no rub/gallop auscultated. RRR.   Musculoskeletal: Gait normal. Symmetric and independent movement of all extremities  Neurological: Normal balance/coordination. No tremor.EOMI, no nystagmus or reproduction of dizziness with high movements. PERRLA. Modified Dix-Hallpike maneuver positive to rightward side, canalith repositioning attempts yielded some relief.  Skin: warm, dry, intact.   Psychiatric: Normal judgment/insight. Normal mood and affect. Oriented x3.    EKG interpretation: Rate: 65 Rhythm: sinus No ST/T changes concerning for acute ischemia/infarct    ASSESSMENT/PLAN:   Vestibular dizziness - Consider imaging/neurology referral as needed. Mild symptoms, will avoid meclizine  for now - Plan: CBC with Differential/Platelet, COMPLETE METABOLIC PANEL WITH GFR, TSH  Chronic seasonal allergic rhinitis due to other allergen - Plan: fluticasone (FLONASE) 50 MCG/ACT nasal spray  Chest  discomfort - Sounds more likely due to PVC, ER precautions were reviewed, no concerns on EKG at this time. no CP on exertion    Patient Instructions  Plan: 1. Exam today indicates most likely inner ear problem which explains dizziness/nausea, this is known as vertigo. See extra printed information for more detail on this and how to perform the maneuvers at home to help with the dizziness. 2. Lab work today to rule out other causes of ear symptoms and to recheck thyroid levels. 3. Prescription was sent for nasal spray to help with sinus congestion/allergy symptoms. Use this daily as directed, would give it at least a few days to a week before you start to notice great improvement in sinus congestion but continue after that. Can add over-the-counter Claritin or Zyrtec to this. 4. Separate visit will be needed to further address back pain, please schedule a follow-up for this. I have sent an anti-inflammatory to use for back pain as needed in the meantime. 5. Plan to follow-up in one to 2 weeks to recheck dizziness symptoms, please see [place] sooner or seek emergency medical care if any worsening or change to your symptoms.    Follow-up plan: Return in about 1 week (around 02/23/2017) for recheck dizziness, sooner if needed.  Visit summary with medication list and pertinent instructions was printed for patient to review, alert [place] if any changes needed. All questions at time of visit were answered - patient instructed to contact office with any additional concerns. ER/RTC precautions were reviewed with the patient and understanding verbalized.   There are other unrelated non-urgent complaints: back pain, but due to the busy schedule and the amount of time I've already spent with her, time does not permit me to address these routine issues at today's visit. I've requested she schedule another appointment to review these additional issues.

## 2017-02-17 LAB — CBC WITH DIFFERENTIAL/PLATELET
Basophils Absolute: 0 cells/uL (ref 0–200)
Basophils Relative: 0 %
Eosinophils Absolute: 300 cells/uL (ref 15–500)
Eosinophils Relative: 4 %
HCT: 44.9 % (ref 35.0–45.0)
Hemoglobin: 14.5 g/dL (ref 11.7–15.5)
Lymphocytes Relative: 29 %
Lymphs Abs: 2175 cells/uL (ref 850–3900)
MCH: 28.9 pg (ref 27.0–33.0)
MCHC: 32.3 g/dL (ref 32.0–36.0)
MCV: 89.6 fL (ref 80.0–100.0)
MPV: 14 fL — ABNORMAL HIGH (ref 7.5–12.5)
Monocytes Absolute: 675 cells/uL (ref 200–950)
Monocytes Relative: 9 %
Neutro Abs: 4350 cells/uL (ref 1500–7800)
Neutrophils Relative %: 58 %
Platelets: 198 10*3/uL (ref 140–400)
RBC: 5.01 MIL/uL (ref 3.80–5.10)
RDW: 14.4 % (ref 11.0–15.0)
WBC: 7.5 10*3/uL (ref 3.8–10.8)

## 2017-02-17 LAB — COMPLETE METABOLIC PANEL WITH GFR
ALT: 36 U/L — ABNORMAL HIGH (ref 6–29)
AST: 28 U/L (ref 10–30)
Albumin: 4.2 g/dL (ref 3.6–5.1)
Alkaline Phosphatase: 73 U/L (ref 33–115)
BUN: 13 mg/dL (ref 7–25)
CO2: 23 mmol/L (ref 20–31)
Calcium: 9 mg/dL (ref 8.6–10.2)
Chloride: 103 mmol/L (ref 98–110)
Creat: 0.65 mg/dL (ref 0.50–1.10)
GFR, Est African American: >89 mL/min (ref >=60)
GFR, Est Non African American: >89 mL/min (ref >=60)
Glucose, Bld: 82 mg/dL (ref 65–99)
Potassium: 4.5 mmol/L (ref 3.5–5.3)
Sodium: 138 mmol/L (ref 135–146)
Total Bilirubin: 0.7 mg/dL (ref 0.2–1.2)
Total Protein: 6.7 g/dL (ref 6.1–8.1)

## 2017-02-17 LAB — TSH: TSH: 0.4 mIU/L

## 2017-02-21 NOTE — Addendum Note (Signed)
Addended by: Huel Cote on: 02/21/2017 10:36 AM   Modules accepted: Orders

## 2017-03-12 ENCOUNTER — Other Ambulatory Visit: Payer: Self-pay | Admitting: Osteopathic Medicine

## 2017-03-30 DIAGNOSIS — J301 Allergic rhinitis due to pollen: Secondary | ICD-10-CM | POA: Diagnosis not present

## 2017-03-30 DIAGNOSIS — N926 Irregular menstruation, unspecified: Secondary | ICD-10-CM | POA: Diagnosis not present

## 2017-04-06 DIAGNOSIS — E079 Disorder of thyroid, unspecified: Secondary | ICD-10-CM | POA: Diagnosis not present

## 2017-04-06 DIAGNOSIS — R0602 Shortness of breath: Secondary | ICD-10-CM | POA: Diagnosis not present

## 2017-04-06 DIAGNOSIS — R0600 Dyspnea, unspecified: Secondary | ICD-10-CM | POA: Diagnosis not present

## 2017-04-06 DIAGNOSIS — J45909 Unspecified asthma, uncomplicated: Secondary | ICD-10-CM | POA: Diagnosis not present

## 2017-04-06 DIAGNOSIS — R220 Localized swelling, mass and lump, head: Secondary | ICD-10-CM | POA: Diagnosis not present

## 2017-04-06 DIAGNOSIS — T7840XA Allergy, unspecified, initial encounter: Secondary | ICD-10-CM | POA: Diagnosis not present

## 2017-04-06 DIAGNOSIS — Z79899 Other long term (current) drug therapy: Secondary | ICD-10-CM | POA: Diagnosis not present

## 2017-04-06 DIAGNOSIS — Z7951 Long term (current) use of inhaled steroids: Secondary | ICD-10-CM | POA: Diagnosis not present

## 2017-05-29 ENCOUNTER — Other Ambulatory Visit: Payer: Self-pay | Admitting: Osteopathic Medicine

## 2017-06-21 DIAGNOSIS — N926 Irregular menstruation, unspecified: Secondary | ICD-10-CM | POA: Diagnosis not present

## 2017-06-21 DIAGNOSIS — E039 Hypothyroidism, unspecified: Secondary | ICD-10-CM | POA: Diagnosis not present

## 2017-06-21 DIAGNOSIS — Z1151 Encounter for screening for human papillomavirus (HPV): Secondary | ICD-10-CM | POA: Diagnosis not present

## 2017-06-21 DIAGNOSIS — R7303 Prediabetes: Secondary | ICD-10-CM | POA: Insufficient documentation

## 2017-06-21 DIAGNOSIS — Z01411 Encounter for gynecological examination (general) (routine) with abnormal findings: Secondary | ICD-10-CM | POA: Diagnosis not present

## 2017-06-21 DIAGNOSIS — Z01419 Encounter for gynecological examination (general) (routine) without abnormal findings: Secondary | ICD-10-CM | POA: Diagnosis not present

## 2018-01-10 ENCOUNTER — Ambulatory Visit: Payer: BLUE CROSS/BLUE SHIELD | Admitting: Physician Assistant

## 2018-01-10 ENCOUNTER — Encounter: Payer: Self-pay | Admitting: Physician Assistant

## 2018-01-10 VITALS — BP 146/97 | HR 77 | Temp 99.0°F | Wt 217.0 lb

## 2018-01-10 DIAGNOSIS — J4531 Mild persistent asthma with (acute) exacerbation: Secondary | ICD-10-CM

## 2018-01-10 DIAGNOSIS — I10 Essential (primary) hypertension: Secondary | ICD-10-CM

## 2018-01-10 DIAGNOSIS — J111 Influenza due to unidentified influenza virus with other respiratory manifestations: Secondary | ICD-10-CM

## 2018-01-10 DIAGNOSIS — R69 Illness, unspecified: Secondary | ICD-10-CM

## 2018-01-10 LAB — POCT INFLUENZA A/B
Influenza A, POC: NEGATIVE
Influenza B, POC: NEGATIVE

## 2018-01-10 MED ORDER — LOSARTAN POTASSIUM 50 MG PO TABS: 50.0000 mg | ORAL_TABLET | Freq: Every day | ORAL | 3 refills | Status: DC

## 2018-01-10 MED ORDER — OSELTAMIVIR PHOSPHATE 75 MG PO CAPS
75.0000 mg | ORAL_CAPSULE | Freq: Two times a day (BID) | ORAL | 0 refills | Status: DC
Start: 2018-01-10 — End: 2018-06-04

## 2018-01-10 MED ORDER — PREDNISONE 50 MG PO TABS: 50.0000 mg | ORAL_TABLET | Freq: Every day | ORAL | 0 refills | Status: DC

## 2018-01-10 MED ORDER — IPRATROPIUM-ALBUTEROL 0.5-2.5 (3) MG/3ML IN SOLN
3.0000 mL | Freq: Once | RESPIRATORY_TRACT | Status: AC
  Administered 2018-01-10: 3 mL via RESPIRATORY_TRACT

## 2018-01-10 MED ORDER — MONTELUKAST SODIUM 10 MG PO TABS: 10.0000 mg | ORAL_TABLET | Freq: Every day | ORAL | 1 refills | Status: DC

## 2018-01-10 MED ORDER — ALBUTEROL SULFATE HFA 108 (90 BASE) MCG/ACT IN AERS: 2.0000 | INHALATION_SPRAY | RESPIRATORY_TRACT | 1 refills | Status: DC | PRN

## 2018-01-10 NOTE — Progress Notes (Signed)
HPI:                                                                Sarah Benjamin is a 47 y.o. female who presents to [facility]: [facility] today for influenza-like illness  Influenza  This is a new problem. The current episode started yesterday. The problem occurs constantly. The problem has been unchanged. Associated symptoms include chest pain, congestion, coughing, fatigue, a fever and myalgias. Pertinent negatives include no abdominal pain, neck pain or vomiting. Associated symptoms comments: + wheezing. She has tried sleep (Albuterol, Theraflu) for the symptoms. The treatment provided mild relief.  Reports using her Albuterol inhaler 3 times last night and 2 times during the day due to dyspnea, chest tightness and wheezing.    No flowsheet data found.  No flowsheet data found.    Past Medical History:  Diagnosis Date  . Anemia   . Thyroid disease    History reviewed. No pertinent surgical history. Social History   Tobacco Use  . Smoking status: Never Smoker  . Smokeless tobacco: Never Used  Substance Use Topics  . Alcohol use: No   family history is not on file.    ROS: negative except as noted in the HPI  Medications: Current Outpatient Medications  Medication Sig Dispense Refill  . butalbital-acetaminophen-caffeine (ESGIC) 50-325-40 MG tablet Take 1-2 tablets by mouth 2 (two) times daily as needed for headache. Do not take more than 2 days per week. 14 tablet 0  . EPINEPHrine 0.3 mg/0.3 mL IJ SOAJ injection Inject into the skin.    . fluticasone (FLONASE) 50 MCG/ACT nasal spray Place 2 sprays into both nostrils daily. 16 g 3  . levothyroxine (SYNTHROID, LEVOTHROID) 75 MCG tablet TAKE 1 TABLET(75 MCG) BY MOUTH DAILY 30 tablet 6  . meloxicam (MOBIC) 7.5 MG tablet Take 1 tablet (7.5 mg total) by mouth daily. To twice daily as needed for back pain 30 tablet 0  . montelukast (SINGULAIR) 10 MG tablet Take 1 tablet (10 mg total) by  mouth at bedtime. 90 tablet 1  . albuterol (PROVENTIL HFA;VENTOLIN HFA) 108 (90 Base) MCG/ACT inhaler Inhale 2 puffs into the lungs every 4 (four) hours as needed for wheezing or shortness of breath. 2 Inhaler 1  . losartan (COZAAR) 50 MG tablet Take 1 tablet (50 mg total) by mouth daily. 30 tablet 3  . oseltamivir (TAMIFLU) 75 MG capsule Take 1 capsule (75 mg total) by mouth 2 (two) times daily. 10 capsule 0  . predniSONE (DELTASONE) 50 MG tablet Take 1 tablet (50 mg total) by mouth daily. 5 tablet 0   No current facility-administered medications for this visit.    No Known Allergies     Objective:  BP (!) 146/97   Pulse 77   Temp 99 F (37.2 C) (Oral)   Wt 217 lb (98.4 kg)   SpO2 97%   BMI 39.69 kg/m  Gen:  alert, ill-appearing, not toxic-appearing, no distress, appropriate for age 38: head normocephalic without obvious abnormality, conjunctiva and cornea clear, TM's clear bilaterally, nasal mucosa edematous, oropharynx clear, moist mucous membranes, neck supple, no adenopathy, trachea midline Pulm: Normal work of breathing, normal phonation, clear to auscultation bilaterally, no wheezes, rales or rhonchi CV: Normal rate, regular  rhythm, s1 and s2 distinct, no murmurs, clicks or rubs  Neuro: alert and oriented x 3, no tremor MSK: extremities atraumatic, normal gait and station Skin: intact, no rashes on exposed skin, no jaundice, no cyanosis   Results for orders placed or performed in visit on 01/10/18 (from the past 72 hour(s))  POCT Influenza A/B     Status: Normal   Collection Time: 01/10/18  4:16 PM  Result Value Ref Range   Influenza A, POC Negative Negative   Influenza B, POC Negative Negative   No results found.    Assessment and Plan: 47 y.o. female with   1. Mild persistent asthma with acute exacerbation - SpO2 97% on RA at rest, no evidence of respiratory distress. She is not currently on any controller medication with increased need for rescue medication.  Re-starting Singulair, consider ICS at follow-up. Steroid burst. Continue albuterol prn. Counseled on Albuterol dosing for exacerbation - predniSONE (DELTASONE) 50 MG tablet; Take 1 tablet (50 mg total) by mouth daily.  Dispense: 5 tablet; Refill: 0 - albuterol (PROVENTIL HFA;VENTOLIN HFA) 108 (90 Base) MCG/ACT inhaler; Inhale 2 puffs into the lungs every 4 (four) hours as needed for wheezing or shortness of breath.  Dispense: 2 Inhaler; Refill: 1 - montelukast (SINGULAIR) 10 MG tablet; Take 1 tablet (10 mg total) by mouth at bedtime.  Dispense: 90 tablet; Refill: 1 - oseltamivir (TAMIFLU) 75 MG capsule; Take 1 capsule (75 mg total) by mouth 2 (two) times daily.  Dispense: 10 capsule; Refill: 0 - ipratropium-albuterol (DUONEB) 0.5-2.5 (3) MG/3ML nebulizer solution 3 mL  2. Influenza-like illness - POCT Influenza A/B negative. She has clinical symptoms of flu and is at increased risk of complications due to her underlying asthma. Treating empirically with Tamiflu - oseltamivir (TAMIFLU) 75 MG capsule; Take 1 capsule (75 mg total) by mouth 2 (two) times daily.  Dispense: 10 capsule; Refill: 0  3. Hypertension goal BP (blood pressure) < 130/80 BP Readings from Last 3 Encounters:  01/10/18 (!) 146/97  02/16/17 135/85  07/27/16 (!) 144/88  - counseled on therapeutic lifestyle changes. Starting ARB. Follow-up with PCP in 1 week - losartan (COZAAR) 50 MG tablet; Take 1 tablet (50 mg total) by mouth daily.  Dispense: 30 tablet; Refill: 3     Patient education and anticipatory guidance given Patient agrees with treatment plan Follow-up in 1 week for HTN and asthma or sooner as needed if symptoms worsen or fail to improve  Darlyne Russian PA-C

## 2018-01-10 NOTE — Patient Instructions (Addendum)
Albuterol inhale 2-4 puffs q3-4 hours for 24-48 hours Re-start your Montelukast every night Prednisone (oral steroid) daily for 5 days Tamiflu twice a day for 5 days  For severe asthma attack, Albuterol 4-6 puffs every 20 minutes as needed and seek emergency medical attention (have someone drive you to the ER or call 911)  For your blood pressure: - Goal <130/80 - start Losartan daily - monitor and log blood pressures at home - check around the same time each day in a relaxed setting - Limit salt to <2000 mg/day - Follow DASH eating plan - limit alcohol to 2 standard drinks per day for men and 1 per day for women - avoid tobacco products - weight loss: 7% of current body weight - follow-up with Dr. Sheppard Coil in 1 week - follow-up every 6 months for your blood pressure    Asma, broncoespasmo agudo (Asthma, Acute Bronchospasm) El broncoespasmo agudo causado por el asma tambin se conoce como crisis de asma. Broncoespasmo significa que las vas respiratorias se han estrechado. La causa del estrechamiento es la inflamacin y la constriccin de los msculos de las vas respiratorias (bronquios) que se encuentran en los pulmones. Esto puede dificultar la respiracin o provocarle sibilancias y tos. [place] desencadenantes posibles son:  La caspa que eliminan los animales de la piel, el pelo o las plumas de [place].  Los caros que se encuentran en el polvo de la casa.  [place].  El polen de los rboles o el csped.  [place].  El humo del cigarrillo o del tabaco  Sustancias contaminantes como el polvo, limpiadores hogareos, aerosoles (como los [place] para el cabello), vapores de pintura, sustancias qumicas fuertes u olores intensos.  El aire fro o cambios climticos. El aire fro puede causar inflamacin. El viento aumenta la cantidad de moho y polen del aire.  Emociones fuertes, Engineer, production o rer intensamente.  Estrs.  Ciertos medicamentos como la aspirina o  betabloqueantes.  Los sulfitos que se encuentran en las comidas y bebidas como frutas secas y el vino.  Enfermedades infecciosas o inflamatorias, como la gripe, el resfro o la inflamacin de las membranas nasales (rinitis).  El reflujo gastroesofgico (ERGE). El reflujo gastroesofgico es una afeccin en la que los cidos estomacales vuelven al esfago.  Los ejercicios o actividades extenuantes. [place].  Tos intensa, especialmente por la noche.  Opresin en el pecho.  Falta de aire.  DIAGNSTICO El mdico le har una historia clnica y le har un examen fsico. [place] Comment indicarn radiografas o anlisis de sangre para buscar otras causas de los sntomas u otras enfermedades que puedan desencadenar una crisis de asma. TRATAMIENTO El tratamiento est dirigido a reducir la inflamacin y [place] vas respiratorias en los pulmones. La mayor parte de las crisis asmticas se tratan con medicamentos por va inhalatoria. Entre ellos se incluyen los medicamentos de alivio rpido o medicamentos de rescate (como los broncodilatadores) y los medicamentos de control (como los corticoides inhalados). Estos medicamentos se administran a travs de Educational psychologist o de un nebulizador. Los corticoides sistmicos por va oral o por va intravenosa tambin se administran para reducir la inflamacin cuando un ataque es moderado o grave. Los antibiticos se indican solo si hay infeccin bacteriana. INSTRUCCIONES PARA EL CUIDADO EN EL HOGAR  Reposo.  Beba lquido en abundancia. Esto ayuda a diluir la mucosidad y a Transport planner. Solo consuma productos con cafena moderadamente y no consuma alcohol hasta que se haya recuperado de la enfermedad.  No fume. Evite la exposicin al humo de otros fumadores.  Usted tiene un rol fundamental en mantener su buena salud. Evite la exposicin a lo que [facility] respiratorios.  Mantenga los medicamentos actualizados y al alcance.  Siga cuidadosamente el plan de tratamiento del mdico.  Utilice los medicamentos tal como se le indic.  Cuando haya mucho polen o polucin, mantenga las ventanas cerradas y use el aire acondicionado o vaya a lugares con aire acondicionado.  El asma requiere atencin [place]. Concurra a los controles segn las indicaciones. Si tiene Nutritional therapist de ms de 24 semanas y le han recetado medicamentos nuevos, comntelo con su obstetra y cul es su evolucin. Concurra a las consultas de control con su mdico segn las indicaciones.  Despus de recuperarse de la crisis de asma, haga una cita con el mdico para conocer cmo puede reducir la probabilidad de futuros ataques. Si no cuenta con un mdico para que controle su asma, haga una cita con un mdico de atencin primaria para hablar de esta enfermedad.  [place] DE INMEDIATO SI:  Empeora.  Tiene dificultad para respirar. Si la dificultad es intensa comunquese con el servicio de Multimedia programmer de su localidad (911 en los [place]).  Siente dolor o Adult nurse.  Tiene vmitos.  No puede retener los lquidos.  Elimina una expectoracin verde, amarilla, amarronada o sanguinolenta.  Tiene fiebre y los sntomas empeoran repentinamente.  Presenta dificultad para tragar.  ASEGRESE DE QUE:  Comprende estas instrucciones.  Controlar su afeccin.  Recibir ayuda de inmediato si no mejora o si empeora.  Esta informacin no tiene Marine scientist el consejo del mdico. Asegrese de hacerle al mdico cualquier pregunta que tenga. Document Released: 02/16/2009 Document Revised: 11/05/2013 Document Reviewed: 05/08/2013 Elsevier Interactive Patient Education  2017 [employer].

## 2018-02-12 ENCOUNTER — Ambulatory Visit: Payer: BLUE CROSS/BLUE SHIELD | Admitting: Sports Medicine

## 2018-02-12 DIAGNOSIS — E039 Hypothyroidism, unspecified: Secondary | ICD-10-CM | POA: Diagnosis not present

## 2018-02-12 DIAGNOSIS — J302 Other seasonal allergic rhinitis: Secondary | ICD-10-CM | POA: Diagnosis not present

## 2018-02-12 DIAGNOSIS — Z79899 Other long term (current) drug therapy: Secondary | ICD-10-CM | POA: Diagnosis not present

## 2018-02-12 DIAGNOSIS — J4530 Mild persistent asthma, uncomplicated: Secondary | ICD-10-CM | POA: Diagnosis not present

## 2018-02-12 DIAGNOSIS — R200 Anesthesia of skin: Secondary | ICD-10-CM | POA: Insufficient documentation

## 2018-02-12 DIAGNOSIS — I639 Cerebral infarction, unspecified: Secondary | ICD-10-CM | POA: Diagnosis not present

## 2018-02-12 DIAGNOSIS — G43909 Migraine, unspecified, not intractable, without status migrainosus: Secondary | ICD-10-CM | POA: Diagnosis not present

## 2018-02-12 DIAGNOSIS — R202 Paresthesia of skin: Secondary | ICD-10-CM | POA: Diagnosis not present

## 2018-02-12 DIAGNOSIS — R0981 Nasal congestion: Secondary | ICD-10-CM | POA: Diagnosis not present

## 2018-02-12 DIAGNOSIS — I10 Essential (primary) hypertension: Secondary | ICD-10-CM | POA: Diagnosis not present

## 2018-02-13 DIAGNOSIS — I517 Cardiomegaly: Secondary | ICD-10-CM | POA: Diagnosis not present

## 2018-02-13 DIAGNOSIS — I10 Essential (primary) hypertension: Secondary | ICD-10-CM | POA: Diagnosis not present

## 2018-02-13 DIAGNOSIS — R200 Anesthesia of skin: Secondary | ICD-10-CM | POA: Diagnosis not present

## 2018-02-13 DIAGNOSIS — J302 Other seasonal allergic rhinitis: Secondary | ICD-10-CM | POA: Diagnosis not present

## 2018-02-13 DIAGNOSIS — R0981 Nasal congestion: Secondary | ICD-10-CM | POA: Diagnosis not present

## 2018-02-15 MED ORDER — GENERIC EXTERNAL MEDICATION
Status: DC
Start: ? — End: 2018-02-15

## 2018-02-15 MED ORDER — ENOXAPARIN SODIUM 40 MG/0.4ML ~~LOC~~ SOLN
40.00 | SUBCUTANEOUS | Status: DC
Start: 2018-02-13 — End: 2018-02-15

## 2018-02-15 MED ORDER — SODIUM CHLORIDE 0.9 % IV SOLN
INTRAVENOUS | Status: DC
Start: ? — End: 2018-02-15

## 2018-02-15 MED ORDER — LOSARTAN POTASSIUM 50 MG PO TABS
50.00 | ORAL_TABLET | ORAL | Status: DC
Start: 2018-02-14 — End: 2018-02-15

## 2018-02-15 MED ORDER — KETOTIFEN FUMARATE 0.025 % OP SOLN
OPHTHALMIC | Status: DC
Start: 2018-02-13 — End: 2018-02-15

## 2018-02-15 MED ORDER — FLUTICASONE PROPIONATE 50 MCG/ACT NA SUSP
NASAL | Status: DC
Start: 2018-02-14 — End: 2018-02-15

## 2018-02-15 MED ORDER — ACETAMINOPHEN 325 MG PO TABS
650.00 | ORAL_TABLET | ORAL | Status: DC
Start: ? — End: 2018-02-15

## 2018-02-15 MED ORDER — MONTELUKAST SODIUM 10 MG PO TABS
10.00 | ORAL_TABLET | ORAL | Status: DC
Start: 2018-02-13 — End: 2018-02-15

## 2018-02-15 MED ORDER — LEVOTHYROXINE SODIUM 25 MCG PO TABS
ORAL_TABLET | ORAL | Status: DC
Start: 2018-02-13 — End: 2018-02-15

## 2018-02-15 MED ORDER — GUAIFENESIN 400 MG PO TABS
400.00 | ORAL_TABLET | ORAL | Status: DC
Start: 2018-02-13 — End: 2018-02-15

## 2018-02-15 MED ORDER — IBUPROFEN 400 MG PO TABS
800.00 | ORAL_TABLET | ORAL | Status: DC
Start: ? — End: 2018-02-15

## 2018-02-21 DIAGNOSIS — J3089 Other allergic rhinitis: Secondary | ICD-10-CM | POA: Diagnosis not present

## 2018-02-21 DIAGNOSIS — J3081 Allergic rhinitis due to animal (cat) (dog) hair and dander: Secondary | ICD-10-CM | POA: Diagnosis not present

## 2018-02-21 DIAGNOSIS — J301 Allergic rhinitis due to pollen: Secondary | ICD-10-CM | POA: Diagnosis not present

## 2018-02-21 DIAGNOSIS — J4540 Moderate persistent asthma, uncomplicated: Secondary | ICD-10-CM | POA: Diagnosis not present

## 2018-02-22 DIAGNOSIS — Z91018 Allergy to other foods: Secondary | ICD-10-CM | POA: Diagnosis not present

## 2018-02-22 DIAGNOSIS — L500 Allergic urticaria: Secondary | ICD-10-CM | POA: Diagnosis not present

## 2018-02-22 DIAGNOSIS — J301 Allergic rhinitis due to pollen: Secondary | ICD-10-CM | POA: Diagnosis not present

## 2018-02-22 DIAGNOSIS — J3089 Other allergic rhinitis: Secondary | ICD-10-CM | POA: Diagnosis not present

## 2018-02-22 DIAGNOSIS — J4540 Moderate persistent asthma, uncomplicated: Secondary | ICD-10-CM | POA: Diagnosis not present

## 2018-02-22 DIAGNOSIS — J3081 Allergic rhinitis due to animal (cat) (dog) hair and dander: Secondary | ICD-10-CM | POA: Diagnosis not present

## 2018-03-13 ENCOUNTER — Other Ambulatory Visit: Payer: Self-pay | Admitting: Osteopathic Medicine

## 2018-03-13 NOTE — Telephone Encounter (Signed)
Yes, been a year since seen in office. OK to sent 30 days but then needs appt for annual visit for further refills to be authorized

## 2018-03-13 NOTE — Telephone Encounter (Signed)
[facility] pharmacy requesting med refill for levothyroxine. Last blood draw completed was 02/16/17. Pls advise if appropriate. Thanks.

## 2018-03-13 NOTE — Telephone Encounter (Signed)
Pt has been updated & will make a f/u appt w/pcp when she has 1 wk left of thyroid medication.

## 2018-03-28 DIAGNOSIS — J3089 Other allergic rhinitis: Secondary | ICD-10-CM | POA: Diagnosis not present

## 2018-03-28 DIAGNOSIS — J301 Allergic rhinitis due to pollen: Secondary | ICD-10-CM | POA: Diagnosis not present

## 2018-03-28 DIAGNOSIS — J3081 Allergic rhinitis due to animal (cat) (dog) hair and dander: Secondary | ICD-10-CM | POA: Diagnosis not present

## 2018-04-16 DIAGNOSIS — J3089 Other allergic rhinitis: Secondary | ICD-10-CM | POA: Diagnosis not present

## 2018-04-16 DIAGNOSIS — J3081 Allergic rhinitis due to animal (cat) (dog) hair and dander: Secondary | ICD-10-CM | POA: Diagnosis not present

## 2018-04-16 DIAGNOSIS — J301 Allergic rhinitis due to pollen: Secondary | ICD-10-CM | POA: Diagnosis not present

## 2018-04-19 ENCOUNTER — Other Ambulatory Visit: Payer: Self-pay | Admitting: Osteopathic Medicine

## 2018-04-19 NOTE — Telephone Encounter (Signed)
Sarah Benjamin been a year since we saw her, ok to send 30 days but she needs a visit

## 2018-04-19 NOTE — Telephone Encounter (Signed)
[facility] pharmacy requesting med RF for levothyroxine. Pt's last lab thyroid check was 02/26/2017. Pls advise if appropriate. Thanks.

## 2018-04-20 NOTE — Telephone Encounter (Signed)
Left a detailed vm msg for pt re: med refill & to return call to schedule a follow up appt with provider. Call back information provided.

## 2018-04-23 DIAGNOSIS — J301 Allergic rhinitis due to pollen: Secondary | ICD-10-CM | POA: Diagnosis not present

## 2018-04-23 DIAGNOSIS — J3081 Allergic rhinitis due to animal (cat) (dog) hair and dander: Secondary | ICD-10-CM | POA: Diagnosis not present

## 2018-04-23 DIAGNOSIS — J3089 Other allergic rhinitis: Secondary | ICD-10-CM | POA: Diagnosis not present

## 2018-04-30 DIAGNOSIS — J3089 Other allergic rhinitis: Secondary | ICD-10-CM | POA: Diagnosis not present

## 2018-04-30 DIAGNOSIS — J3081 Allergic rhinitis due to animal (cat) (dog) hair and dander: Secondary | ICD-10-CM | POA: Diagnosis not present

## 2018-04-30 DIAGNOSIS — J301 Allergic rhinitis due to pollen: Secondary | ICD-10-CM | POA: Diagnosis not present

## 2018-05-07 ENCOUNTER — Telehealth: Payer: Self-pay | Admitting: Osteopathic Medicine

## 2018-05-07 ENCOUNTER — Ambulatory Visit: Payer: BLUE CROSS/BLUE SHIELD | Admitting: Osteopathic Medicine

## 2018-05-07 NOTE — Telephone Encounter (Signed)
Noted  

## 2018-05-07 NOTE — Telephone Encounter (Signed)
Dr. Hulen Luster called around 8 am this morning and cancelled her appointment. - CF

## 2018-05-14 DIAGNOSIS — J3081 Allergic rhinitis due to animal (cat) (dog) hair and dander: Secondary | ICD-10-CM | POA: Diagnosis not present

## 2018-05-14 DIAGNOSIS — J301 Allergic rhinitis due to pollen: Secondary | ICD-10-CM | POA: Diagnosis not present

## 2018-05-14 DIAGNOSIS — J3089 Other allergic rhinitis: Secondary | ICD-10-CM | POA: Diagnosis not present

## 2018-05-19 ENCOUNTER — Other Ambulatory Visit: Payer: Self-pay | Admitting: Osteopathic Medicine

## 2018-05-21 DIAGNOSIS — J301 Allergic rhinitis due to pollen: Secondary | ICD-10-CM | POA: Diagnosis not present

## 2018-05-21 DIAGNOSIS — J3089 Other allergic rhinitis: Secondary | ICD-10-CM | POA: Diagnosis not present

## 2018-05-21 DIAGNOSIS — J3081 Allergic rhinitis due to animal (cat) (dog) hair and dander: Secondary | ICD-10-CM | POA: Diagnosis not present

## 2018-05-28 DIAGNOSIS — J3089 Other allergic rhinitis: Secondary | ICD-10-CM | POA: Diagnosis not present

## 2018-05-28 DIAGNOSIS — J3081 Allergic rhinitis due to animal (cat) (dog) hair and dander: Secondary | ICD-10-CM | POA: Diagnosis not present

## 2018-05-28 DIAGNOSIS — J301 Allergic rhinitis due to pollen: Secondary | ICD-10-CM | POA: Diagnosis not present

## 2018-06-04 ENCOUNTER — Encounter: Payer: Self-pay | Admitting: Physician Assistant

## 2018-06-04 ENCOUNTER — Ambulatory Visit (INDEPENDENT_AMBULATORY_CARE_PROVIDER_SITE_OTHER): Payer: BLUE CROSS/BLUE SHIELD | Admitting: Physician Assistant

## 2018-06-04 VITALS — BP 138/90 | HR 71 | Wt 219.0 lb

## 2018-06-04 DIAGNOSIS — J301 Allergic rhinitis due to pollen: Secondary | ICD-10-CM | POA: Diagnosis not present

## 2018-06-04 DIAGNOSIS — Z13220 Encounter for screening for lipoid disorders: Secondary | ICD-10-CM | POA: Diagnosis not present

## 2018-06-04 DIAGNOSIS — M5442 Lumbago with sciatica, left side: Secondary | ICD-10-CM

## 2018-06-04 DIAGNOSIS — R7401 Elevation of levels of liver transaminase levels: Secondary | ICD-10-CM

## 2018-06-04 DIAGNOSIS — J3081 Allergic rhinitis due to animal (cat) (dog) hair and dander: Secondary | ICD-10-CM | POA: Diagnosis not present

## 2018-06-04 DIAGNOSIS — J3089 Other allergic rhinitis: Secondary | ICD-10-CM | POA: Diagnosis not present

## 2018-06-04 DIAGNOSIS — R740 Nonspecific elevation of levels of transaminase and lactic acid dehydrogenase [LDH]: Secondary | ICD-10-CM

## 2018-06-04 DIAGNOSIS — I10 Essential (primary) hypertension: Secondary | ICD-10-CM | POA: Diagnosis not present

## 2018-06-04 DIAGNOSIS — H8111 Benign paroxysmal vertigo, right ear: Secondary | ICD-10-CM | POA: Insufficient documentation

## 2018-06-04 DIAGNOSIS — Z5181 Encounter for therapeutic drug level monitoring: Secondary | ICD-10-CM | POA: Diagnosis not present

## 2018-06-04 DIAGNOSIS — E039 Hypothyroidism, unspecified: Secondary | ICD-10-CM

## 2018-06-04 DIAGNOSIS — M546 Pain in thoracic spine: Secondary | ICD-10-CM

## 2018-06-04 DIAGNOSIS — J309 Allergic rhinitis, unspecified: Secondary | ICD-10-CM

## 2018-06-04 MED ORDER — MELOXICAM 15 MG PO TABS: 15.0000 mg | ORAL_TABLET | Freq: Every day | ORAL | 0 refills | Status: DC

## 2018-06-04 MED ORDER — FLUTICASONE PROPIONATE 50 MCG/ACT NA SUSP: 2.0000 | Freq: Every day | NASAL | 3 refills | Status: DC

## 2018-06-04 MED ORDER — CYCLOBENZAPRINE HCL 10 MG PO TABS: 5.0000 mg | ORAL_TABLET | Freq: Every evening | ORAL | 0 refills | Status: DC | PRN

## 2018-06-04 NOTE — Progress Notes (Signed)
HPI:                                                                Sarah Benjamin is a 47 y.o. female who presents to [facility]: [place] today for medication management  Hypothyroidism: takes Synthroid 75 mcg daily without issues. Denies chest pain, heart palpitations, tremor, GI symptoms, or skin changes.  HTN: taking Losartan 50 mg daily. Compliant with medications. Does not check BP's at home. Denies vision change, headache, chest pain with exertion, orthopnea, lightheadedness, syncope and edema. Risk factors include:  Back pain: reports severe back pain from shoulders to sacrum x 2 weeks. This is a recurrent problem, waxing and waning for the last 3 years. Current pain is left-sided, occasionally sharp, described as "swollen." Worse with prolonged sitting and standing. Pain radiates to left leg including ankle. Denies constitutional symptoms,  bowel/bladder dysfunction, saddle numbness. Has been taking Tylenol with moderate relief.  Dizziness: for the last 3-4 months, has had recurrent episodes of dizziness described as "room spinning round and round." Episodes are provoked by stretching/home exercises. Last minutes. Associated with nausea and tinnitus. Most recent episode was followed by a dull left-sided headache that resolved spontaneously. Denies hearing loss, otalgia, daily headache, vision change, syncope.  No flowsheet data found.  No flowsheet data found.    Past Medical History:  Diagnosis Date  . Anemia   . Thyroid disease    History reviewed. No pertinent surgical history. Social History   Tobacco Use  . Smoking status: Never Smoker  . Smokeless tobacco: Never Used  Substance Use Topics  . Alcohol use: No   family history is not on file.    ROS: negative except as noted in the HPI  Medications: Current Outpatient Medications  Medication Sig Dispense Refill  . albuterol (PROVENTIL HFA;VENTOLIN HFA) 108 (90 Base)  MCG/ACT inhaler Inhale 2 puffs into the lungs every 4 (four) hours as needed for wheezing or shortness of breath. 2 Inhaler 1  . butalbital-acetaminophen-caffeine (ESGIC) 50-325-40 MG tablet Take 1-2 tablets by mouth 2 (two) times daily as needed for headache. Do not take more than 2 days per week. 14 tablet 0  . EPINEPHrine 0.3 mg/0.3 mL IJ SOAJ injection Inject into the skin.    . fluticasone (FLONASE) 50 MCG/ACT nasal spray Place 2 sprays into both nostrils daily. 16 g 3  . levothyroxine (SYNTHROID, LEVOTHROID) 75 MCG tablet Take 1 tablet (75 mcg total) by mouth daily before breakfast. Must schedule appt before further refills 30 tablet 0  . losartan (COZAAR) 50 MG tablet Take 1 tablet (50 mg total) by mouth daily. 30 tablet 3  . montelukast (SINGULAIR) 10 MG tablet Take 1 tablet (10 mg total) by mouth at bedtime. 90 tablet 1  . meloxicam (MOBIC) 15 MG tablet Take 1 tablet (15 mg total) by mouth daily. 30 tablet 0   No current facility-administered medications for this visit.    No Known Allergies     Objective:  BP 138/90   Pulse 71   Wt 219 lb (99.3 kg)   BMI 40.06 kg/m  Gen:  alert, not ill-appearing, no distress, appropriate for age 19: head normocephalic without obvious abnormality, conjunctiva and cornea clear, TM's pearly gray and semi-transparent bilaterally, trachea  midline Pulm: Normal work of breathing, normal phonation, clear to auscultation bilaterally, no wheezes, rales or rhonchi CV: Normal rate, regular rhythm, s1 and s2 distinct, no murmurs, clicks or rubs  Neuro: alert and oriented x 3, DTR's intact MSK: lower extremity strength 5/5 symmetric, extremities atraumatic, normal gait and station Back: atraumatic, diffuse tender trigger points in the periscapular area, midback and lumbosacral area, negative SLR Skin: intact, no rashes on exposed skin, no jaundice, no cyanosis Psych: well-groomed, cooperative, good eye contact, euthymic mood, affect mood-congruent,  speech is articulate, and thought processes clear and goal-directed  Lab Results  Component Value Date   TSH 0.40 02/16/2017     No results found for this or any previous visit (from the past 72 hour(s)). No results found.    Assessment and Plan: 47 y.o. female with   Hypothyroidism, unspecified type - Plan: TSH  Medication monitoring encounter - Plan: TSH, Comprehensive metabolic panel, Lipid Panel w/reflex Direct LDL  Screening for lipid disorders - Plan: Lipid Panel w/reflex Direct LDL  Essential hypertension - Plan: Comprehensive metabolic panel  Acute left-sided thoracic back pain - Plan: Ambulatory referral to Physical Therapy, meloxicam (MOBIC) 15 MG tablet, cyclobenzaprine (FLEXERIL) 10 MG tablet  Acute left-sided low back pain with left-sided sciatica - Plan: Ambulatory referral to Physical Therapy, meloxicam (MOBIC) 15 MG tablet, cyclobenzaprine (FLEXERIL) 10 MG tablet  BPPV (benign paroxysmal positional vertigo), right - Plan: Ambulatory referral to ENT  Allergic rhinitis, unspecified seasonality, unspecified trigger - Plan: fluticasone (FLONASE) 50 MCG/ACT nasal spray   Acute left-sided thoracic back pain: - no constitutional symptoms, saddle numbness, bowel/bladder dysfunction - physical therapy twice a week for 4-6 weeks - Meloxicam 1 tab daily with food for 2 weeks, then once daily as needed - Cyclobenzaprine at bedtime as needed for muscle spasm - Ice/Heat x 20 minutes every 3 hours as needed - AVOID bending and heavy lifting - AVOID bed rest - DO stay physically active, stretch   HTN BP Readings from Last 3 Encounters:  06/04/18 138/90  01/10/18 (!) 146/97  02/16/17 135/85  - BP out of range today - CMP, lipids pending - Goal <130/80 - baby aspirin 81 mg daily to help prevent heart attack/stroke - monitor and log blood pressures at home - check around the same time each day in a relaxed setting - Limit salt to <2000 mg/day - Follow DASH eating  plan - limit alcohol to 2 standard drinks per day for men and 1 per day for women - avoid tobacco products - weight loss: 7% of current body weight - return in 2 weeks for nurse BP check. Bring readings   Patient education and anticipatory guidance given Patient agrees with treatment plan Follow-up every 3 months for medication management or sooner as needed if symptoms worsen or fail to improve  Darlyne Russian PA-C

## 2018-06-04 NOTE — Patient Instructions (Addendum)
For back pain: - physical therapy twice a week for 4-6 weeks - Meloxicam 1 tab daily with food for 2 weeks, then once daily as needed - Cyclobenzaprine at bedtime as needed for muscle spasm - Ice/Heat x 20 minutes every 3 hours as needed - AVOID bending and heavy lifting - AVOID bed rest - DO stay physically active, stretch   For your blood pressure: - Goal <130/80 - baby aspirin 81 mg daily to help prevent heart attack/stroke - monitor and log blood pressures at home - check around the same time each day in a relaxed setting - Limit salt to <2000 mg/day - Follow DASH eating plan - limit alcohol to 2 standard drinks per day for men and 1 per day for women - avoid tobacco products - weight loss: 7% of current body weight - return in 2 weeks for nurse BP check. Bring readings

## 2018-06-05 LAB — COMPREHENSIVE METABOLIC PANEL
AG Ratio: 2 (calc) (ref 1.0–2.5)
ALT: 99 U/L — ABNORMAL HIGH (ref 6–29)
AST: 62 U/L — ABNORMAL HIGH (ref 10–35)
Albumin: 4.4 g/dL (ref 3.6–5.1)
Alkaline phosphatase (APISO): 87 U/L (ref 33–115)
BUN: 13 mg/dL (ref 7–25)
CO2: 28 mmol/L (ref 20–32)
Calcium: 9 mg/dL (ref 8.6–10.2)
Chloride: 104 mmol/L (ref 98–110)
Creat: 0.58 mg/dL (ref 0.50–1.10)
Globulin: 2.2 g/dL (calc) (ref 1.9–3.7)
Glucose, Bld: 91 mg/dL (ref 65–99)
Potassium: 4.2 mmol/L (ref 3.5–5.3)
Sodium: 138 mmol/L (ref 135–146)
Total Bilirubin: 0.5 mg/dL (ref 0.2–1.2)
Total Protein: 6.6 g/dL (ref 6.1–8.1)

## 2018-06-05 LAB — LIPID PANEL W/REFLEX DIRECT LDL
Cholesterol: 177 mg/dL (ref <200)
HDL: 38 mg/dL — ABNORMAL LOW (ref >50)
LDL Cholesterol (Calc): 102 mg/dL (calc) — ABNORMAL HIGH
Non-HDL Cholesterol (Calc): 139 mg/dL (calc) — ABNORMAL HIGH (ref <130)
Total CHOL/HDL Ratio: 4.7 (calc) (ref <5.0)
Triglycerides: 246 mg/dL — ABNORMAL HIGH (ref <150)

## 2018-06-05 LAB — TSH: TSH: 7.83 mIU/L — ABNORMAL HIGH

## 2018-06-06 ENCOUNTER — Other Ambulatory Visit: Payer: Self-pay | Admitting: Physician Assistant

## 2018-06-06 DIAGNOSIS — E039 Hypothyroidism, unspecified: Secondary | ICD-10-CM

## 2018-06-06 MED ORDER — LEVOTHYROXINE SODIUM 88 MCG PO TABS: 88.0000 ug | ORAL_TABLET | Freq: Every day | ORAL | 3 refills | Status: DC

## 2018-06-06 NOTE — Progress Notes (Signed)
TSH is high Confirm patient is taking the medication on an empty stomach first thing in the morning by itself Increasing dose to 88 mcg Recheck TSH in 6 weeks

## 2018-06-06 NOTE — Progress Notes (Signed)
Liver enzymes are increased Avoid alcohol Follow low-fat diet, increase regular aerobic exercise and lose 5% of current body weight Recheck liver enzymes in 6 weeks

## 2018-06-12 ENCOUNTER — Encounter: Payer: Self-pay | Admitting: Physician Assistant

## 2018-06-12 DIAGNOSIS — J309 Allergic rhinitis, unspecified: Secondary | ICD-10-CM | POA: Insufficient documentation

## 2018-06-12 DIAGNOSIS — R740 Nonspecific elevation of levels of transaminase and lactic acid dehydrogenase [LDH]: Secondary | ICD-10-CM

## 2018-06-12 DIAGNOSIS — R7401 Elevation of levels of liver transaminase levels: Secondary | ICD-10-CM | POA: Insufficient documentation

## 2018-06-13 DIAGNOSIS — J301 Allergic rhinitis due to pollen: Secondary | ICD-10-CM | POA: Diagnosis not present

## 2018-06-13 DIAGNOSIS — J3089 Other allergic rhinitis: Secondary | ICD-10-CM | POA: Diagnosis not present

## 2018-06-13 DIAGNOSIS — J3081 Allergic rhinitis due to animal (cat) (dog) hair and dander: Secondary | ICD-10-CM | POA: Diagnosis not present

## 2018-06-14 ENCOUNTER — Encounter: Payer: Self-pay | Admitting: Rehabilitative and Restorative Service Providers"

## 2018-06-14 ENCOUNTER — Ambulatory Visit (INDEPENDENT_AMBULATORY_CARE_PROVIDER_SITE_OTHER): Payer: BLUE CROSS/BLUE SHIELD | Admitting: Rehabilitative and Restorative Service Providers"

## 2018-06-14 DIAGNOSIS — M5442 Lumbago with sciatica, left side: Secondary | ICD-10-CM

## 2018-06-14 DIAGNOSIS — R29898 Other symptoms and signs involving the musculoskeletal system: Secondary | ICD-10-CM | POA: Diagnosis not present

## 2018-06-14 DIAGNOSIS — M546 Pain in thoracic spine: Secondary | ICD-10-CM

## 2018-06-14 DIAGNOSIS — R293 Abnormal posture: Secondary | ICD-10-CM

## 2018-06-14 NOTE — Patient Instructions (Addendum)
HIP: Hamstrings - Supine  Place strap around foot. Raise leg up, keeping knee straight.  Bend opposite knee to protect back if indicated. Hold 30 seconds. 3 reps per set, 2-3 sets per day   Piriformis Stretch   Lying on back, cross let foot over right thigh; pull left knee across toward opposite shoulder. Hold 30 seconds. Repeat 3 times. Do 2-3 sessions per day.   Quads / HF, Prone KNEE: Quadriceps - Prone    Place strap around ankle. Bring ankle toward buttocks. Press hip into surface. Hold 30 seconds. Repeat 3 times per session. Do 2-3 sessions per day.      Side Bend, Sitting with feet on floor - NOT cross legged     Sit cross-legged on floor. Bend to one side, touching elbow to floor. Hold _20-30__ seconds. To increase stretch, raise other arm above head.  Repeat _3__ times per session. Do _2-3__ sessions per day.  Self massage using ~ 4 inch plastic ball   TENS UNIT: This is helpful for muscle pain and spasm.   Search and Purchase a TENS 7000 2nd edition at www.tenspros.com. It should be less than $30.     TENS unit instructions: Do not shower or bathe with the unit on Turn the unit off before removing electrodes or batteries If the electrodes lose stickiness add a drop of water to the electrodes after they are disconnected from the unit and place on plastic sheet. If you continued to have difficulty, call the TENS unit company to purchase more electrodes. Do not apply lotion on the skin area prior to use. Make sure the skin is clean and dry as this will help prolong the life of the electrodes. After use, always check skin for unusual red areas, rash or other skin difficulties. If there are any skin problems, does not apply electrodes to the same area. Never remove the electrodes from the unit by pulling the wires. Do not use the TENS unit or electrodes other than as directed. Do not change electrode placement without consultating your therapist or  physician. Keep 2 fingers with between each electrode.    Posture Tips DO: - stand tall and erect - keep chin tucked in - keep head and shoulders in alignment - check posture regularly in mirror or large window - pull head back against headrest in car seat;  Change your position often.  Sit with lumbar support. DON'T: - slouch or slump while watching TV or reading - sit, stand or lie in one position  for too long;  Sitting is especially hard on the spine so if you sit at a desk/use the computer, then stand up often!    Sleeping on Back  Place pillow under knees. A pillow with cervical support and a roll around waist are also helpful. Copyright  VHI. All rights reserved.  Sleeping on Side Place pillow between knees. Use cervical support under neck and a roll around waist as needed. Copyright  VHI. All rights reserved.   Sleeping on Stomach   If this is the only desirable sleeping position, place pillow under lower legs, and under stomach or chest as needed.  Posture - Sitting   Sit upright, head facing forward. Try using a roll to support lower back. Keep shoulders relaxed, and avoid rounded back. Keep hips level with knees. Avoid crossing legs for long periods. Stand to Sit / Sit to Stand   To sit: Bend knees to lower self onto front edge of chair, then scoot  back on seat. To stand: Reverse sequence by placing one foot forward, and scoot to front of seat. Use rocking motion to stand up.   Work Height and Reach  Ideal work height is no more than 2 to 4 inches below elbow level when standing, and at elbow level when sitting. Reaching should be limited to arm's length, with elbows slightly bent.  Bending  Bend at hips and knees, not back. Keep feet shoulder-width apart.    Posture - Standing   Good posture is important. Avoid slouching and forward head thrust. Maintain curve in low back and align ears over shoul- ders, hips over ankles.  Alternating  Positions   Alternate tasks and change positions frequently to reduce fatigue and muscle tension. Take rest breaks. Computer Work   Position work to Programmer, multimedia. Use proper work and seat height. Keep shoulders back and down, wrists straight, and elbows at right angles. Use chair that provides full back support. Add footrest and lumbar roll as needed.  Getting Into / Out of Car  Lower self onto seat, scoot back, then bring in one leg at a time. Reverse sequence to get out.  Dressing  Lie on back to pull socks or slacks over feet, or sit and bend leg while keeping back straight.    Housework - Sink  Place one foot on ledge of cabinet under sink when standing at sink for prolonged periods.   Pushing / Pulling  Pushing is preferable to pulling. Keep back in proper alignment, and use leg muscles to do the work.  Deep Squat   Squat and lift with both arms held against upper trunk. Tighten stomach muscles without holding breath. Use smooth movements to avoid jerking.  Avoid Twisting   Avoid twisting or bending back. Pivot around using foot movements, and bend at knees if needed when reaching for articles.  Carrying Luggage   Distribute weight evenly on both sides. Use a cart whenever possible. Do not twist trunk. Move body as a unit.   Lifting Principles .Maintain proper posture and head alignment. .Slide object as close as possible before lifting. .Move obstacles out of the way. .Test before lifting; ask for help if too heavy. .Tighten stomach muscles without holding breath. .Use smooth movements; do not jerk. .Use legs to do the work, and pivot with feet. .Distribute the work load symmetrically and close to the center of trunk. .Push instead of pull whenever possible.   Ask For Help   Ask for help and delegate to others when possible. Coordinate your movements when lifting together, and maintain the low back curve.  Log Roll   Lying on back, bend left knee  and place left arm across chest. Roll all in one movement to the right. Reverse to roll to the left. Always move as one unit. Housework - Sweeping  Use long-handled equipment to avoid stooping.   Housework - Wiping  Position yourself as close as possible to reach work surface. Avoid straining your back.  Laundry - Unloading Wash   To unload small items at bottom of washer, lift leg opposite to arm being used to reach.  [place] close to area to be raked. Use arm movements to do the work. Keep back straight and avoid twisting.     Cart  When reaching into cart with one arm, lift opposite leg to keep back straight.   Getting Into / Out of Bed  Lower self to lie down on one side by  raising legs and lowering head at the same time. Use arms to assist moving without twisting. Bend both knees to roll onto back if desired. To sit up, start from lying on side, and use same move-ments in reverse. Housework - Vacuuming  Hold the vacuum with arm held at side. Step back and forth to move it, keeping head up. Avoid twisting.   Laundry - IT consultant so that bending and twisting can be avoided.   Laundry - Unloading Dryer  Squat down to reach into clothes dryer or use a reacher.  Gardening - Weeding / Probation officer or Kneel. Knee pads may be helpful.                   d.

## 2018-06-14 NOTE — Therapy (Addendum)
[facility] [place], [place], [address] Phone: [phone]   Fax:  [phone]  Physical Therapy Evaluation  Patient Details  Name: Lailana Shira MRN: [id number] Date of Birth: [birth year] Referring Provider: Nelson Chimes PA-C    Encounter Date: 06/14/2018  PT End of Session - 06/14/18 1642    Visit Number  1    Number of Visits  12    Date for PT Re-Evaluation  07/26/18    PT Start Time  6962    PT Stop Time  1745    PT Time Calculation (min)  62 min    Activity Tolerance  Patient tolerated treatment well       Past Medical History:  Diagnosis Date  . Anemia   . Thyroid disease     History reviewed. No pertinent surgical history.  There were no vitals filed for this visit.   Subjective Assessment - 06/14/18 1654    Subjective  Patient reports that she had a gradual onset of mid to low back pain and pain into the posterior Lt hip. She does not know of any injury. Symptoms have worsened in the past several weeks. She has some improvement with medication. She is now having burning pain in the lateral thigh with prolonged standing - present for the past week.     Pertinent History  denies any musculoskeletal problems - she had some LBP after delivery of 54 yr old daughter which resolved     Patient Stated Goals  get rid of the pain and tightness in her back     Currently in Pain?  Yes    Pain Score  5     Pain Location  Back    Pain Orientation  Left    Pain Descriptors / Indicators  Sharp    Pain Type  Chronic pain    Pain Radiating Towards  into the posterior Lt hip     Pain Onset  More than a month ago    Pain Frequency  Intermittent    Aggravating Factors   prolonged lying on side or standing; sweeping; bending    Pain Relieving Factors  meds; cold or hot shower          [facility] PT Assessment - 06/14/18 0001      Assessment   Medical Diagnosis  Lt thoracic pain; LBP; Lt posterior hip pain      Referring Provider  Nelson Chimes PA-C     Onset Date/Surgical Date  12/15/17    Hand Dominance  Right    Next MD Visit  9/19    Prior Therapy  none       Precautions   Precautions  None      Balance Screen   Has the patient fallen in the past 6 months  No    Has the patient had a decrease in activity level because of a fear of falling?   No    Is the patient reluctant to leave their home because of a fear of falling?   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, standing, walking, lifting ~ 35 pounds X 1 time a day     Leisure  household chores - sits in soft sofa of chair       Observation/Other Assessments   Focus  on Therapeutic Outcomes (FOTO)   48% limitation       Sensation   Additional Comments  intermittent burning in the lateral Lt thigh       Posture/Postural Control   Posture Comments  head forward; shoulders rounded and elevated; increased thoracic kyphosis       AROM   Lumbar Flexion  65% painful Lt lumbar    Lumbar Extension  20% painful Lt side thoracic to lumbar     Lumbar - Right Side Bend  75% painful pulling Lt lumbar     Lumbar - Left Side Bend  70% painful Lt upper back    Lumbar - Right Rotation  50% pulling Lt side     Lumbar - Left Rotation  30% painful Lt side       Strength   Overall Strength Comments  grossly 5/5 bilat U/LE's       Flexibility   Hamstrings  ~ 75 deg bilat     Quadriceps  tight Lt > Rt     ITB  tight Lt    Piriformis  tight Lt       Palpation   Spinal mobility  hypomobile through lumbar spine and mid to upper thoracic spine with CPA mobs     SI assessment   ~ symmetrical     Palpation comment  muscular tightness through the Lt posterior hip - piriformis and gluts; lumbar paraspinals; lateral trunk; thoracic paraspinals; upper trap; leveator; pecs       Special Tests   Other special tests  SLR;  Fabers (-)                 Objective measurements completed on examination: See above findings.      [facility] Adult PT Treatment/Exercise - 06/14/18 0001      Therapeutic Activites    Therapeutic Activities  -- myofacial ball release work       Neuro Re-ed    Neuro Re-ed Details   discussed importannce of improved posture at work and home       Lumbar Exercises: Stretches   Hip Best boy  Right;Left;2 reps;30 seconds seated     Control and instrumentation engineer reps;30 seconds prone with strap     Piriformis Stretch  Left;2 reps;30 seconds supine travell     Other Lumbar Stretch Exercise  lateral trunk flexion in sitting to stretch Lt thoracolumbar musculatrure 30 sec x 2       Moist Heat Therapy   Number Minutes Moist Heat  20 Minutes    Moist Heat Location  Lumbar Spine;Shoulder Lt lumbar/posterior hip; Lt thoracic;Lt posterior upper trap      Electrical Stimulation   Electrical Stimulation Location  Lt posterior hip; lumbar spine; thoracic spine; upper trap     Electrical Stimulation Action  IFC    Electrical Stimulation Parameters  to tolerance    Electrical Stimulation Goals  Tone;Pain             PT Education - 06/14/18 1744    Education Details  HEP TENS back care     Person(s) Educated  Patient    Methods  Explanation;Demonstration;Tactile cues;Verbal cues;Handout    Comprehension  Verbalized understanding;Returned demonstration;Verbal cues required;Tactile cues required          PT Long Term Goals - 06/14/18 1644      PT LONG TERM GOAL #1   Title  Improve posture and alignment with patient to demonstrate improved upright posture 07/26/18  Time  6    Period  Weeks    Status  New      PT LONG TERM GOAL #2   Title  Decrease pain by 75% allowing patient to participate in norma ADL's and sleep through the night without awakening due to pain 07/26/18    Time  6    Period  Weeks    Status  New      PT LONG TERM GOAL #3   Title  Patient to demonstrate  and/or verbalize proper body mechanics with transitional movements and positions for spine care 07/26/18    Time  6    Period  Weeks    Status  New      PT LONG TERM GOAL #4   Title  Independent in HEP 07/26/18    Time  6    Period  Weeks    Status  New      PT LONG TERM GOAL #5   Title  Improve FOTO to </= 34% limitation 07/26/18    Time  6    Period  Weeks    Status  New             Plan - 06/14/18 1749    Clinical Impression Statement  Tonya presents with chronic Left thoracic, lumbar and hip pain of at least 6 months duration. She has no known injury. Patient does remember significant Lt LBP following the birth of her oldest daughter 40 + yrs ago and again during the 65th month of pregnancy with her 46 yr old. She has poor posture and alignment; limited trunk and LE mobility and ROM; muscular tightness to palpation; pain with functional activities; inability to sleep/rest without awakending due to pain. Patient will benefit from PT to address problems identified.     History and Personal Factors relevant to plan of care:  chronic recurrent LBP; sedentary lifestyle     Clinical Presentation  Evolving    Clinical Decision Making  Low    Rehab Potential  Good    PT Frequency  2x / week    PT Duration  6 weeks    PT Treatment/Interventions  Patient/family education;Neuromuscular re-education;Cryotherapy;Electrical Stimulation;Iontophoresis 14m/ml Dexamethasone;Moist Heat;Ultrasound;Dry needling;Manual techniques;Therapeutic activities;Therapeutic exercise    PT Next Visit Plan  review HEP; progress with postural correction/back care education/body mechanics; stretching; add trap of doorway stretch/upper trap stretch; core stabilization; manual work; modalities     Consulted and Agree with Plan of Care  Patient       Patient will benefit from skilled therapeutic intervention in order to improve the following deficits and impairments:  Postural dysfunction, Improper body mechanics,  Pain, Increased muscle spasms, Increased fascial restricitons, Hypomobility, Decreased strength, Decreased mobility, Decreased range of motion, Decreased activity tolerance  Visit Diagnosis: Pain in thoracic spine - Plan: PT plan of care cert/re-cert  Acute left-sided low back pain with left-sided sciatica - Plan: PT plan of care cert/re-cert  Other symptoms and signs involving the musculoskeletal system - Plan: PT plan of care cert/re-cert  Abnormal posture - Plan: PT plan of care cert/re-cert     Problem List Patient Active Problem List   Diagnosis Date Noted  . Allergic rhinitis 06/12/2018  . Transaminitis 06/12/2018  . BPPV (benign paroxysmal positional vertigo), right 06/04/2018  . Essential hypertension 02/12/2018  . Numbness and tingling of left side of face 02/12/2018  . Prediabetes 06/21/2017  . Irregular menses 03/30/2017  . Dizziness 04/19/2016  . Seasonal allergies 04/19/2016  . Sinus headache  04/19/2016  . History of Bell's palsy 04/19/2016  . PFO (patent foramen ovale) 01/12/2016  . Pain in the chest 01/12/2016  . Back pain 04/17/2015  . Hip pain 04/17/2015  . Mild persistent asthma without complication 43/56/8616  . Hypothyroid 01/13/2014  . Constipation 01/13/2014  . Migraine 01/13/2014    Elsbeth Yearick Nilda Simmer PT, MPH  06/14/2018, 6:12 PM  [facility] [address] [place], [place], [address] Phone: [phone]   Fax:  [phone]  Name: Anntoinette Haefele MRN: [id number] Date of Birth: 1971-11-01  PHYSICAL THERAPY DISCHARGE SUMMARY  Visits from Start of Care: Evaluation only   Current functional level related to goals / functional outcomes: See eval   Remaining deficits: Unknown    Education / Equipment: Initial HEP  Plan: Patient agrees to discharge.  Patient goals were not met. Patient is being discharged due to the patient's request.  ?????     Unable to attend PT due to work  schedule.  Seanna Sisler P. Helene Kelp PT, MPH 07/26/18 4:52 PM

## 2018-06-18 ENCOUNTER — Other Ambulatory Visit: Payer: Self-pay | Admitting: Osteopathic Medicine

## 2018-06-18 ENCOUNTER — Ambulatory Visit: Payer: BLUE CROSS/BLUE SHIELD

## 2018-06-18 ENCOUNTER — Encounter: Payer: BLUE CROSS/BLUE SHIELD | Admitting: Rehabilitative and Restorative Service Providers"

## 2018-06-25 ENCOUNTER — Encounter: Payer: BLUE CROSS/BLUE SHIELD | Admitting: Physical Therapy

## 2018-06-27 ENCOUNTER — Encounter: Payer: BLUE CROSS/BLUE SHIELD | Admitting: Physical Therapy

## 2018-06-28 ENCOUNTER — Encounter: Payer: BLUE CROSS/BLUE SHIELD | Admitting: Rehabilitative and Restorative Service Providers"

## 2018-06-28 DIAGNOSIS — J301 Allergic rhinitis due to pollen: Secondary | ICD-10-CM | POA: Diagnosis not present

## 2018-06-28 DIAGNOSIS — J3089 Other allergic rhinitis: Secondary | ICD-10-CM | POA: Diagnosis not present

## 2018-06-28 DIAGNOSIS — J3081 Allergic rhinitis due to animal (cat) (dog) hair and dander: Secondary | ICD-10-CM | POA: Diagnosis not present

## 2018-07-03 DIAGNOSIS — J3081 Allergic rhinitis due to animal (cat) (dog) hair and dander: Secondary | ICD-10-CM | POA: Diagnosis not present

## 2018-07-03 DIAGNOSIS — J3089 Other allergic rhinitis: Secondary | ICD-10-CM | POA: Diagnosis not present

## 2018-07-03 DIAGNOSIS — J301 Allergic rhinitis due to pollen: Secondary | ICD-10-CM | POA: Diagnosis not present

## 2018-07-04 DIAGNOSIS — M79605 Pain in left leg: Secondary | ICD-10-CM | POA: Diagnosis not present

## 2018-07-04 DIAGNOSIS — R531 Weakness: Secondary | ICD-10-CM | POA: Diagnosis not present

## 2018-07-04 DIAGNOSIS — I10 Essential (primary) hypertension: Secondary | ICD-10-CM | POA: Diagnosis not present

## 2018-07-05 DIAGNOSIS — M79605 Pain in left leg: Secondary | ICD-10-CM | POA: Diagnosis not present

## 2018-07-05 DIAGNOSIS — M549 Dorsalgia, unspecified: Secondary | ICD-10-CM | POA: Diagnosis not present

## 2018-07-11 DIAGNOSIS — R531 Weakness: Secondary | ICD-10-CM | POA: Diagnosis not present

## 2018-07-11 DIAGNOSIS — G8194 Hemiplegia, unspecified affecting left nondominant side: Secondary | ICD-10-CM | POA: Diagnosis not present

## 2018-07-12 DIAGNOSIS — J3089 Other allergic rhinitis: Secondary | ICD-10-CM | POA: Diagnosis not present

## 2018-07-12 DIAGNOSIS — J301 Allergic rhinitis due to pollen: Secondary | ICD-10-CM | POA: Diagnosis not present

## 2018-07-12 DIAGNOSIS — J3081 Allergic rhinitis due to animal (cat) (dog) hair and dander: Secondary | ICD-10-CM | POA: Diagnosis not present

## 2018-07-17 DIAGNOSIS — J3081 Allergic rhinitis due to animal (cat) (dog) hair and dander: Secondary | ICD-10-CM | POA: Diagnosis not present

## 2018-07-17 DIAGNOSIS — J3089 Other allergic rhinitis: Secondary | ICD-10-CM | POA: Diagnosis not present

## 2018-07-17 DIAGNOSIS — J301 Allergic rhinitis due to pollen: Secondary | ICD-10-CM | POA: Diagnosis not present

## 2018-07-26 ENCOUNTER — Encounter: Payer: Self-pay | Admitting: Osteopathic Medicine

## 2018-07-26 ENCOUNTER — Ambulatory Visit (INDEPENDENT_AMBULATORY_CARE_PROVIDER_SITE_OTHER): Payer: BLUE CROSS/BLUE SHIELD | Admitting: Osteopathic Medicine

## 2018-07-26 VITALS — BP 127/79 | HR 54 | Temp 98.3°F | Wt 218.1 lb

## 2018-07-26 DIAGNOSIS — E039 Hypothyroidism, unspecified: Secondary | ICD-10-CM

## 2018-07-26 DIAGNOSIS — Z23 Encounter for immunization: Secondary | ICD-10-CM

## 2018-07-26 DIAGNOSIS — M546 Pain in thoracic spine: Secondary | ICD-10-CM

## 2018-07-26 DIAGNOSIS — I10 Essential (primary) hypertension: Secondary | ICD-10-CM | POA: Diagnosis not present

## 2018-07-26 DIAGNOSIS — R7401 Elevation of levels of liver transaminase levels: Secondary | ICD-10-CM

## 2018-07-26 DIAGNOSIS — R740 Nonspecific elevation of levels of transaminase and lactic acid dehydrogenase [LDH]: Secondary | ICD-10-CM | POA: Diagnosis not present

## 2018-07-26 DIAGNOSIS — R7309 Other abnormal glucose: Secondary | ICD-10-CM

## 2018-07-26 DIAGNOSIS — M5442 Lumbago with sciatica, left side: Secondary | ICD-10-CM

## 2018-07-26 MED ORDER — MELOXICAM 15 MG PO TABS: 15.0000 mg | ORAL_TABLET | Freq: Every day | ORAL | 3 refills | Status: DC | PRN

## 2018-07-26 NOTE — Progress Notes (Signed)
HPI: Sarah Benjamin is a 47 y.o. female who  has a past medical history of Anemia and Thyroid disease.  she presents to [facility] today, 07/26/18,  for chief complaint of:  BP follow-up, labs  Patient was seen by a colleague when I was out of the office 06/04/2018.  She presents today to follow-up for blood pressure labs.  Last time I actually saw this patient was 02/16/2017 for dizziness  Hypothyroid: Patient states due for blood work.  Most recent labs reviewed.  TSH 06/04/2018 was 7.83.  Dose was increased to 88 mcg. Tolerating medicines well.   Elevated liver enzymes: A bit higher than previously, patient due for recheck on this.  Occasional muscle aches and pains, particularly after a long day at work.  Records reviewed: I am unable to find recent Pap smear, looks like A1c checked a couple times through the [facility] system was in the prediabetic range.      Past medical history, surgical history, and family history reviewed.  Current medication list and allergy/intolerance information reviewed.   (See remainder of HPI, ROS, Phys Exam below)  BP 127/79 (BP Location: Left Arm, Patient Position: Sitting, Cuff Size: Large)   Pulse (!) 54   Temp 98.3 F (36.8 C) (Oral)   Wt 218 lb 1.6 oz (98.9 kg)   BMI 39.89 kg/m       ASSESSMENT/PLAN: The primary encounter diagnosis was Essential hypertension. Diagnoses of Hypothyroidism, unspecified type, Transaminitis, and Elevated hemoglobin A1c measurement were also pertinent to this visit.  Orders Placed This Encounter  Procedures  . CBC  . COMPLETE METABOLIC PANEL WITH GFR  . TSH  . Hemoglobin A1c  . Lipid panel      Meds ordered this encounter  Medications  . meloxicam (MOBIC) 15 MG tablet    Sig: Take 1 tablet (15 mg total) by mouth daily as needed for pain.    Dispense:  30 tablet    Refill:  3    Patient Instructions  For ENT:  [phone] please call to set up appointment   Will  get labs today, follow-up with me depends on results!    Follow-up plan: Return for recheck depending on lab results! .                ############################################ ############################################ ############################################ ############################################    Outpatient Encounter Medications as of 07/26/2018  Medication Sig  . albuterol (PROVENTIL HFA;VENTOLIN HFA) 108 (90 Base) MCG/ACT inhaler Inhale 2 puffs into the lungs every 4 (four) hours as needed for wheezing or shortness of breath.  . butalbital-acetaminophen-caffeine (ESGIC) 50-325-40 MG tablet Take 1-2 tablets by mouth 2 (two) times daily as needed for headache. Do not take more than 2 days per week.  . cyclobenzaprine (FLEXERIL) 10 MG tablet Take 0.5-1 tablets (5-10 mg total) by mouth at bedtime as needed for muscle spasms.  Marland Kitchen EPINEPHrine 0.3 mg/0.3 mL IJ SOAJ injection Inject into the skin.  . fluticasone (FLONASE) 50 MCG/ACT nasal spray Place 2 sprays into both nostrils daily.  Marland Kitchen levothyroxine (SYNTHROID, LEVOTHROID) 88 MCG tablet Take 1 tablet (88 mcg total) by mouth daily before breakfast. Must schedule appt before further refills  . losartan (COZAAR) 50 MG tablet Take 1 tablet (50 mg total) by mouth daily.  . meloxicam (MOBIC) 15 MG tablet Take 1 tablet (15 mg total) by mouth daily.  . montelukast (SINGULAIR) 10 MG tablet Take 1 tablet (10 mg total) by mouth at bedtime.   No facility-administered  encounter medications on file as of 07/26/2018.    No Known Allergies    Review of Systems:  Constitutional: No recent illness  Cardiac: No  chest pain  Respiratory:  No  shortness of breath. No  Cough  Gastrointestinal: No  abdominal pain,  Musculoskeletal: +myalgia/arthralgia  Neurologic: No  weakness, No  Dizziness  Psychiatric: No  concerns with depression, No  concerns with anxiety  Exam:  BP 127/79 (BP Location: Left Arm, Patient  Position: Sitting, Cuff Size: Large)   Pulse (!) 54   Temp 98.3 F (36.8 C) (Oral)   Wt 218 lb 1.6 oz (98.9 kg)   BMI 39.89 kg/m   Constitutional: VS see above. General Appearance: alert, well-developed, well-nourished, NAD  Eyes: Normal lids and conjunctive, non-icteric sclera  Ears, Nose, Mouth, Throat: MMM, Normal external inspection ears/nares/mouth/lips/gums.  Neck: No masses, trachea midline.   Respiratory: Normal respiratory effort. no wheeze, no rhonchi, no rales  Cardiovascular: S1/S2 normal, no murmur, no rub/gallop auscultated. RRR.   Musculoskeletal: Gait normal. Symmetric and independent movement of all extremities  Neurological: Normal balance/coordination. No tremor.  Skin: warm, dry, intact.   Psychiatric: Normal judgment/insight. Normal mood and affect. Oriented x3.   Visit summary with medication list and pertinent instructions was printed for patient to review, advised to alert [place] if any changes needed. All questions at time of visit were answered - patient instructed to contact office with any additional concerns. ER/RTC precautions were reviewed with the patient and understanding verbalized.   Follow-up plan: Return for recheck depending on lab results! .   Please note: voice recognition software was used to produce this document, and typos may escape review. Please contact Dr. Sheppard Coil for any needed clarifications.

## 2018-07-26 NOTE — Patient Instructions (Signed)
For ENT:  [phone] please call to set up appointment   Will get labs today, follow-up with me depends on results!

## 2018-07-26 NOTE — Addendum Note (Signed)
Addended by: Mertha Finders on: 07/26/2018 08:48 AM   Modules accepted: Orders

## 2018-07-27 ENCOUNTER — Other Ambulatory Visit: Payer: Self-pay | Admitting: Osteopathic Medicine

## 2018-07-27 DIAGNOSIS — R740 Nonspecific elevation of levels of transaminase and lactic acid dehydrogenase [LDH]: Principal | ICD-10-CM

## 2018-07-27 DIAGNOSIS — E039 Hypothyroidism, unspecified: Secondary | ICD-10-CM

## 2018-07-27 DIAGNOSIS — R7401 Elevation of levels of liver transaminase levels: Secondary | ICD-10-CM

## 2018-07-27 LAB — CBC
HCT: 42.3 % (ref 35.0–45.0)
Hemoglobin: 13.8 g/dL (ref 11.7–15.5)
MCH: 29.3 pg (ref 27.0–33.0)
MCHC: 32.6 g/dL (ref 32.0–36.0)
MCV: 89.8 fL (ref 80.0–100.0)
MPV: 14 fL — ABNORMAL HIGH (ref 7.5–12.5)
Platelets: 205 10*3/uL (ref 140–400)
RBC: 4.71 10*6/uL (ref 3.80–5.10)
RDW: 14.1 % (ref 11.0–15.0)
WBC: 7.1 10*3/uL (ref 3.8–10.8)

## 2018-07-27 LAB — LIPID PANEL
Cholesterol: 179 mg/dL (ref <200)
HDL: 47 mg/dL — ABNORMAL LOW (ref >50)
LDL Cholesterol (Calc): 113 mg/dL (calc) — ABNORMAL HIGH
Non-HDL Cholesterol (Calc): 132 mg/dL (calc) — ABNORMAL HIGH (ref <130)
Total CHOL/HDL Ratio: 3.8 (calc) (ref <5.0)
Triglycerides: 90 mg/dL (ref <150)

## 2018-07-27 LAB — HEMOGLOBIN A1C
Hgb A1c MFr Bld: 6.2 % of total Hgb — ABNORMAL HIGH (ref <5.7)
Mean Plasma Glucose: 131 (calc)
eAG (mmol/L): 7.3 (calc)

## 2018-07-27 LAB — COMPLETE METABOLIC PANEL WITH GFR
AG Ratio: 1.6 (calc) (ref 1.0–2.5)
ALT: 56 U/L — ABNORMAL HIGH (ref 6–29)
AST: 36 U/L — ABNORMAL HIGH (ref 10–35)
Albumin: 3.9 g/dL (ref 3.6–5.1)
Alkaline phosphatase (APISO): 90 U/L (ref 33–115)
BUN: 15 mg/dL (ref 7–25)
CO2: 28 mmol/L (ref 20–32)
Calcium: 8.8 mg/dL (ref 8.6–10.2)
Chloride: 107 mmol/L (ref 98–110)
Creat: 0.8 mg/dL (ref 0.50–1.10)
GFR, Est African American: 103 mL/min/{1.73_m2} (ref >=60)
GFR, Est Non African American: 89 mL/min/{1.73_m2} (ref >=60)
Globulin: 2.5 g/dL (calc) (ref 1.9–3.7)
Glucose, Bld: 94 mg/dL (ref 65–99)
Potassium: 4.7 mmol/L (ref 3.5–5.3)
Sodium: 141 mmol/L (ref 135–146)
Total Bilirubin: 0.3 mg/dL (ref 0.2–1.2)
Total Protein: 6.4 g/dL (ref 6.1–8.1)

## 2018-07-27 LAB — TSH: TSH: 5.55 mIU/L — ABNORMAL HIGH

## 2018-07-27 MED ORDER — LEVOTHYROXINE SODIUM 100 MCG PO TABS: 100.0000 ug | ORAL_TABLET | Freq: Every day | ORAL | 0 refills | Status: DC

## 2018-07-29 ENCOUNTER — Encounter: Payer: Self-pay | Admitting: Osteopathic Medicine

## 2018-07-30 ENCOUNTER — Ambulatory Visit (INDEPENDENT_AMBULATORY_CARE_PROVIDER_SITE_OTHER): Payer: BLUE CROSS/BLUE SHIELD

## 2018-07-30 DIAGNOSIS — R748 Abnormal levels of other serum enzymes: Secondary | ICD-10-CM | POA: Diagnosis not present

## 2018-07-30 DIAGNOSIS — R740 Nonspecific elevation of levels of transaminase and lactic acid dehydrogenase [LDH]: Secondary | ICD-10-CM

## 2018-08-06 DIAGNOSIS — J3089 Other allergic rhinitis: Secondary | ICD-10-CM | POA: Diagnosis not present

## 2018-08-06 DIAGNOSIS — J3081 Allergic rhinitis due to animal (cat) (dog) hair and dander: Secondary | ICD-10-CM | POA: Diagnosis not present

## 2018-08-06 DIAGNOSIS — J301 Allergic rhinitis due to pollen: Secondary | ICD-10-CM | POA: Diagnosis not present

## 2018-08-16 DIAGNOSIS — J3081 Allergic rhinitis due to animal (cat) (dog) hair and dander: Secondary | ICD-10-CM | POA: Diagnosis not present

## 2018-08-16 DIAGNOSIS — J3089 Other allergic rhinitis: Secondary | ICD-10-CM | POA: Diagnosis not present

## 2018-08-16 DIAGNOSIS — J301 Allergic rhinitis due to pollen: Secondary | ICD-10-CM | POA: Diagnosis not present

## 2018-08-17 ENCOUNTER — Other Ambulatory Visit: Payer: Self-pay | Admitting: Physician Assistant

## 2018-08-17 DIAGNOSIS — I10 Essential (primary) hypertension: Secondary | ICD-10-CM

## 2018-08-23 DIAGNOSIS — J301 Allergic rhinitis due to pollen: Secondary | ICD-10-CM | POA: Diagnosis not present

## 2018-08-23 DIAGNOSIS — J3089 Other allergic rhinitis: Secondary | ICD-10-CM | POA: Diagnosis not present

## 2018-08-23 DIAGNOSIS — J3081 Allergic rhinitis due to animal (cat) (dog) hair and dander: Secondary | ICD-10-CM | POA: Diagnosis not present

## 2018-08-30 DIAGNOSIS — L500 Allergic urticaria: Secondary | ICD-10-CM | POA: Diagnosis not present

## 2018-08-30 DIAGNOSIS — J4540 Moderate persistent asthma, uncomplicated: Secondary | ICD-10-CM | POA: Diagnosis not present

## 2018-08-30 DIAGNOSIS — J3089 Other allergic rhinitis: Secondary | ICD-10-CM | POA: Diagnosis not present

## 2018-08-30 DIAGNOSIS — J3081 Allergic rhinitis due to animal (cat) (dog) hair and dander: Secondary | ICD-10-CM | POA: Diagnosis not present

## 2018-08-30 DIAGNOSIS — J301 Allergic rhinitis due to pollen: Secondary | ICD-10-CM | POA: Diagnosis not present

## 2018-09-06 DIAGNOSIS — J3089 Other allergic rhinitis: Secondary | ICD-10-CM | POA: Diagnosis not present

## 2018-09-06 DIAGNOSIS — J3081 Allergic rhinitis due to animal (cat) (dog) hair and dander: Secondary | ICD-10-CM | POA: Diagnosis not present

## 2018-09-06 DIAGNOSIS — J301 Allergic rhinitis due to pollen: Secondary | ICD-10-CM | POA: Diagnosis not present

## 2018-09-17 DIAGNOSIS — J3089 Other allergic rhinitis: Secondary | ICD-10-CM | POA: Diagnosis not present

## 2018-09-17 DIAGNOSIS — J301 Allergic rhinitis due to pollen: Secondary | ICD-10-CM | POA: Diagnosis not present

## 2018-09-17 DIAGNOSIS — J3081 Allergic rhinitis due to animal (cat) (dog) hair and dander: Secondary | ICD-10-CM | POA: Diagnosis not present

## 2018-09-18 ENCOUNTER — Ambulatory Visit (INDEPENDENT_AMBULATORY_CARE_PROVIDER_SITE_OTHER): Payer: BLUE CROSS/BLUE SHIELD | Admitting: Physician Assistant

## 2018-09-18 ENCOUNTER — Ambulatory Visit (INDEPENDENT_AMBULATORY_CARE_PROVIDER_SITE_OTHER): Payer: BLUE CROSS/BLUE SHIELD

## 2018-09-18 ENCOUNTER — Encounter: Payer: Self-pay | Admitting: Physician Assistant

## 2018-09-18 VITALS — BP 129/84 | HR 60 | Temp 98.5°F | Wt 215.0 lb

## 2018-09-18 DIAGNOSIS — M25541 Pain in joints of right hand: Secondary | ICD-10-CM

## 2018-09-18 DIAGNOSIS — M25542 Pain in joints of left hand: Secondary | ICD-10-CM | POA: Insufficient documentation

## 2018-09-18 DIAGNOSIS — S63612A Unspecified sprain of right middle finger, initial encounter: Secondary | ICD-10-CM

## 2018-09-18 DIAGNOSIS — X58XXXA Exposure to other specified factors, initial encounter: Secondary | ICD-10-CM | POA: Diagnosis not present

## 2018-09-18 DIAGNOSIS — S63632A Sprain of interphalangeal joint of right middle finger, initial encounter: Secondary | ICD-10-CM

## 2018-09-18 MED ORDER — PREDNISONE 50 MG PO TABS: ORAL_TABLET | ORAL | 0 refills | Status: DC

## 2018-09-18 NOTE — Progress Notes (Signed)
Subjective:    I'm seeing this patient as a consultation for: Nelson Chimes, PA-C, Dr. Emeterio Reeve  CC: Right hand pain  HPI: This is a pleasant 47 year old female, for the past few weeks she said increasing pain in the third and fourth fingers, she seems to localize this at the MCP.  Denies any recent trauma, does not have much swelling.  No triggering of the fingers.  She tried some NSAIDs without much improvement.  Symptoms are moderate, persistent.  No family history of rheumatoid arthritis or lupus.  I reviewed the past medical history, family history, social history, surgical history, and allergies today and no changes were needed.  Please see the problem list section below in epic for further details.  Past Medical History: Past Medical History:  Diagnosis Date  . Anemia   . Thyroid disease    Past Surgical History: History reviewed. No pertinent surgical history. Social History: Social History   Socioeconomic History  . Marital status: Single    Spouse name: Not on file  . Number of children: Not on file  . Years of education: Not on file  . Highest education level: Not on file  Occupational History  . Not on file  Social Needs  . Financial resource strain: Not on file  . Food insecurity:    Worry: Not on file    Inability: Not on file  . Transportation needs:    Medical: Not on file    Non-medical: Not on file  Tobacco Use  . Smoking status: Never Smoker  . Smokeless tobacco: Never Used  Substance and Sexual Activity  . Alcohol use: No  . Drug use: No  . Sexual activity: Not on file  Lifestyle  . Physical activity:    Days per week: Not on file    Minutes per session: Not on file  . Stress: Not on file  Relationships  . Social connections:    Talks on phone: Not on file    Gets together: Not on file    Attends religious service: Not on file    Active member of club or organization: Not on file    Attends meetings of clubs or organizations:  Not on file    Relationship status: Not on file  Other Topics Concern  . Not on file  Social History Narrative  . Not on file   Family History: No family history on file. Allergies: No Known Allergies Medications: See med rec.  Review of Systems: No headache, visual changes, nausea, vomiting, diarrhea, constipation, dizziness, abdominal pain, skin rash, fevers, chills, night sweats, weight loss, swollen lymph nodes, body aches, joint swelling, muscle aches, chest pain, shortness of breath, mood changes, visual or auditory hallucinations.   Objective:   General: Well Developed, well nourished, and in no acute distress.  Neuro:  Extra-ocular muscles intact, able to move all 4 extremities, sensation grossly intact.  Deep tendon reflexes tested were normal. Psych: Alert and oriented, mood congruent with affect. ENT:  Ears and nose appear unremarkable.  Hearing grossly normal. Neck: Unremarkable overall appearance, trachea midline.  No visible thyroid enlargement. Eyes: Conjunctivae and lids appear unremarkable.  Pupils equal and round. Skin: Warm and dry, no rashes noted.  Cardiovascular: Pulses palpable, no extremity edema. Right hand: Tender to palpation at the third MCP with palpable synovitis, tightness through the range of motion, no palpable flexor tendon nodules, no visible or palpable triggering.  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   Impression and Recommendations:   This case required medical decision making of moderate complexity.  Arthralgia of metacarpophalangeal joint, right third Accessory ossicle noted at the third MCP, suspect old avulsion fracture. 5 days of prednisone, icing for 20 minutes 3-4 times a day, hand exercises given. Return to see me in 1 month, we will probably do an injection if no  better. ___________________________________________ Gwen Her. Dianah Field, M.D., ABFM., CAQSM. Primary Care and Sports Medicine [facility]  Adjunct Professor of [employer] of [facility] [employer] [facility] [employer]

## 2018-09-18 NOTE — Patient Instructions (Signed)
Intermetacarpal Sprain An intermetacarpal sprain happens when tissues between bones in the hand (metacarpals) become overstretched or torn(ruptured). This usually happens because of an injury to the hand. Intermetacarpal sprains range from mild to severe. They can take up to 2-12 weeks to heal, with proper treatment. What are the causes? This injury is caused by excess pressure or strain (stress) that is applied to the intermetacarpal ligaments. This often happens because of a hard, direct hit or injury (trauma) to the hand. What increases the risk? The following factors may make you more likely to develop this injury:  A previous hand injury.  Doing repetitive motions with your hands, such as movements in sports or heavy labor.  Having poor strength and flexibility in your hands.  What are the signs or symptoms? Symptoms of this injury may include:  A feeling of popping or tearing inside the hand.  Pain and inflammation, especially in the knuckles.  Bruising.  Limited range of motion of the hand.  How is this diagnosed? This injury is diagnosed based on a physical exam and your medical history. You may have X-rays to check for breaks (fractures) in your bones. Your sprain may be rated in degrees, based on how severe it is. The ratings include:  First-degree. A ligamentis stretched but it still has its normal shape.  Second-degree. A ligament is partially ruptured, and you may have some difficulty moving your hand normally.  Third-degree. A ligament is completely ruptured, and you may not be able to move the affected hand.  How is this treated? This injury is treated by resting, icing, raising (elevating), and applying pressure (compression) to the injured area. Depending on the severity of your sprain, treatment may also include:  Medicines that help to relieve pain.  Keeping your hand in a fixed position (immobilization) for a period of time. This may be done using a  bandage (dressing), a cast, or a splint.  Exercises to strengthen and stretch your hand. You may be referred to a physical therapist.  Surgery. This is rare.  Follow these instructions at home: If you have a cast:  Do not stick anything inside the cast to scratch your skin. Doing that increases your risk of infection.  Check the skin around the cast every day. Report any concerns to your health care provider. You may put lotion on dry skin around the edges of the cast. Do not apply lotion to the skin underneath the cast.  Do not let your cast get wet if it is not waterproof.  Keep the cast clean. If you have a splint:  Wear the splint as told by your health care provider. Remove it only as told by your health care provider.  Loosen the splint if your fingers tingle, become numb, or turn cold and blue.  Do not let your splint get wet if it is not waterproof.  Keep the splint clean. Bathing  If you have a cast, splint, or dressing, do not take baths, swim, or use a hot tub until your health care provider approves. Ask your health care provider if you can take showers. You may only be allowed to take sponge baths for bathing.  If you have a cast or splint that is not waterproof, cover it with a watertight plastic bag when you take a bath or a shower. Managing pain, stiffness, and swelling  If directed, apply ice to the injured area: ? Put ice in a plastic bag. ? Place a towel between your skin   and the bag. ? Leave the ice on for 20 minutes, 2-3 times per day.  Move your fingers often to avoid stiffness and to lessen swelling.  Elevate your hand above the level of your heart while you are sitting or lying down.  Wear a compression wrap only as told by your health care provider. Driving  Do not drive or operate heavy machinery while taking prescription pain medicine.  Ask your health care provider when it is safe to drive if you have a cast or splint on a hand that you use  for driving. Activity  Return to your normal activities as told by your health care provider. Ask your health care provider what activities are safe for you.  Avoid activities that cause pain or make your condition worse.  Do exercises as told by your health care provider or physical therapist. General instructions  Take over-the-counter and prescription medicines only as told by your health care provider.  If you have a cast or a splint, do not put pressure on any part of the cast or splint until it is fully hardened. This may take several hours.  Do not wear rings on the fingers of your injured hand.  Keep all follow-up visits as told by your health care provider. This is important. Contact a health care provider if:  You have symptoms that do not get better after 2 weeks of treatment.  You have more redness, swelling, or pain in your injured area.  You have a fever.  Your cast or splint gets damaged. Get help right away if:  You have severe pain.  You develop numbness in your hand or fingers.  You cannot move your hand or fingers.  Your hand or fingers feel unusually cold.  Your hand or fingers turn blue.  Your fingernails turn a dark color, such as blue or gray. This information is not intended to replace advice given to you by your health care provider. Make sure you discuss any questions you have with your health care provider. Document Released: 10/31/2005 Document Revised: 04/07/2016 Document Reviewed: 04/20/2015 Elsevier Interactive Patient Education  2018 Elsevier Inc.  

## 2018-09-18 NOTE — Assessment & Plan Note (Signed)
Accessory ossicle noted at the third MCP, suspect old avulsion fracture. 5 days of prednisone, icing for 20 minutes 3-4 times a day, hand exercises given. Return to see me in 1 month, we will probably do an injection if no better.

## 2018-09-18 NOTE — Progress Notes (Signed)
HPI:                                                                Sarah Benjamin is a 47 y.o. female who presents to [place]: [place] today for right hand pain  Hand Pain   The incident occurred more than 1 week ago (x 3 weeks). There was no injury mechanism. The pain is present in the right hand (right third finger). The pain is moderate. The pain has been constant since the incident. Associated symptoms include muscle weakness (difficulty making a fist). The symptoms are aggravated by movement. She has tried NSAIDs for the symptoms. The treatment provided mild relief.     Past Medical History:  Diagnosis Date  . Anemia   . Thyroid disease    History reviewed. No pertinent surgical history. Social History   Tobacco Use  . Smoking status: Never Smoker  . Smokeless tobacco: Never Used  Substance Use Topics  . Alcohol use: No   family history is not on file.    ROS: negative except as noted in the HPI  Medications: Current Outpatient Medications  Medication Sig Dispense Refill  . albuterol (PROVENTIL HFA;VENTOLIN HFA) 108 (90 Base) MCG/ACT inhaler Inhale 2 puffs into the lungs every 4 (four) hours as needed for wheezing or shortness of breath. 2 Inhaler 1  . butalbital-acetaminophen-caffeine (ESGIC) 50-325-40 MG tablet Take 1-2 tablets by mouth 2 (two) times daily as needed for headache. Do not take more than 2 days per week. 14 tablet 0  . EPINEPHrine 0.3 mg/0.3 mL IJ SOAJ injection Inject into the skin.    . fluticasone (FLONASE) 50 MCG/ACT nasal spray Place 2 sprays into both nostrils daily. 16 g 3  . levothyroxine (SYNTHROID, LEVOTHROID) 100 MCG tablet Take 1 tablet (100 mcg total) by mouth daily before breakfast. Must schedule appt before further refills 90 tablet 0  . losartan (COZAAR) 50 MG tablet TAKE 1 TABLET(50 MG) BY MOUTH DAILY 30 tablet 0  . meloxicam (MOBIC) 15 MG tablet Take 1 tablet (15 mg total) by mouth daily as  needed for pain. 30 tablet 3  . montelukast (SINGULAIR) 10 MG tablet Take 1 tablet (10 mg total) by mouth at bedtime. 90 tablet 1  . predniSONE (DELTASONE) 50 MG tablet One tab PO daily for 5 days. 5 tablet 0   No current facility-administered medications for this visit.    No Known Allergies     Objective:  BP 129/84   Pulse 60   Temp 98.5 F (36.9 C) (Oral)   Wt 215 lb (97.5 kg)   BMI 39.32 kg/m  Gen:  alert, not ill-appearing, no distress, appropriate for age 60: head normocephalic without obvious abnormality, conjunctiva and cornea clear, trachea midline Pulm: Normal work of breathing, normal phonation Neuro: alert and oriented x 3, no tremor MSK: extremities atraumatic, normal gait and station Right hand: atraumatic, 3rd and 4th phalanges appear swollen compared to left, no synovitis, tenderness of the interphalangeal and metacarpophalangeal joints of the 3rd digit; unable to fully form a fist with right hand Skin: intact, no rashes on exposed skin, no jaundice, no cyanosis    No results found for this or any previous visit (from the past 72 hour(s)). Dg Hand  Complete Right  Result Date: 09/19/2018 CLINICAL DATA:  Right third finger interphalangeal joint sprain EXAM: RIGHT HAND - COMPLETE 3+ VIEW COMPARISON:  None. FINDINGS: No fracture or dislocation. No suspicious focal osseous lesion. No significant arthropathy. No radiopaque foreign body. IMPRESSION: Negative. Electronically Signed   By: Ilona Sorrel M.D.   On: 09/19/2018 08:46      Assessment and Plan: 47 y.o. female with   .Sarah Benjamin was seen today for hand pain.  Diagnoses and all orders for this visit:  Sprain of right middle finger, unspecified site of finger, initial encounter -     DG Hand Complete Right  Arthralgia of metacarpophalangeal joint, right third -     predniSONE (DELTASONE) 50 MG tablet; One tab PO daily for 5 days.  X-ray ordered and personally reviewed with Sports Medicine No acute  abnormality noted on X-ray. For this reason Sports Med was consulted   Patient education and anticipatory guidance given Patient agrees with treatment plan Follow-up as needed if symptoms worsen or fail to improve  Darlyne Russian PA-C

## 2018-09-19 ENCOUNTER — Encounter: Payer: Self-pay | Admitting: Physician Assistant

## 2018-09-27 DIAGNOSIS — J301 Allergic rhinitis due to pollen: Secondary | ICD-10-CM | POA: Diagnosis not present

## 2018-09-27 DIAGNOSIS — J3089 Other allergic rhinitis: Secondary | ICD-10-CM | POA: Diagnosis not present

## 2018-09-27 DIAGNOSIS — J3081 Allergic rhinitis due to animal (cat) (dog) hair and dander: Secondary | ICD-10-CM | POA: Diagnosis not present

## 2018-10-04 DIAGNOSIS — J3081 Allergic rhinitis due to animal (cat) (dog) hair and dander: Secondary | ICD-10-CM | POA: Diagnosis not present

## 2018-10-04 DIAGNOSIS — J3089 Other allergic rhinitis: Secondary | ICD-10-CM | POA: Diagnosis not present

## 2018-10-04 DIAGNOSIS — J301 Allergic rhinitis due to pollen: Secondary | ICD-10-CM | POA: Diagnosis not present

## 2018-10-15 DIAGNOSIS — J301 Allergic rhinitis due to pollen: Secondary | ICD-10-CM | POA: Diagnosis not present

## 2018-10-15 DIAGNOSIS — J3081 Allergic rhinitis due to animal (cat) (dog) hair and dander: Secondary | ICD-10-CM | POA: Diagnosis not present

## 2018-10-15 DIAGNOSIS — J3089 Other allergic rhinitis: Secondary | ICD-10-CM | POA: Diagnosis not present

## 2018-10-16 ENCOUNTER — Ambulatory Visit (INDEPENDENT_AMBULATORY_CARE_PROVIDER_SITE_OTHER): Payer: BLUE CROSS/BLUE SHIELD | Admitting: Sports Medicine

## 2018-10-16 ENCOUNTER — Encounter: Payer: Self-pay | Admitting: Sports Medicine

## 2018-10-16 DIAGNOSIS — M25541 Pain in joints of right hand: Secondary | ICD-10-CM | POA: Diagnosis not present

## 2018-10-16 NOTE — Assessment & Plan Note (Signed)
Third MCP synovitis is completely resolved with prednisone, icing, hand exercises. If recurrence we will do a full rheumatoid work-up and consider an injection. Otherwise return as needed.

## 2018-10-16 NOTE — Progress Notes (Signed)
  Subjective:    CC: Follow-up  HPI: This is a very pleasant 47 year old female, we treated her for a right third MCP synovitis with prednisone, hand exercises and icing, she returns today completely pain-free.  I reviewed the past medical history, family history, social history, surgical history, and allergies today and no changes were needed.  Please see the problem list section below in epic for further details.  Past Medical History: Past Medical History:  Diagnosis Date  . Anemia   . Thyroid disease    Past Surgical History: No past surgical history on file. Social History: Social History   Socioeconomic History  . Marital status: Single    Spouse name: Not on file  . Number of children: Not on file  . Years of education: Not on file  . Highest education level: Not on file  Occupational History  . Not on file  Social Needs  . Financial resource strain: Not on file  . Food insecurity:    Worry: Not on file    Inability: Not on file  . Transportation needs:    Medical: Not on file    Non-medical: Not on file  Tobacco Use  . Smoking status: Never Smoker  . Smokeless tobacco: Never Used  Substance and Sexual Activity  . Alcohol use: No  . Drug use: No  . Sexual activity: Not on file  Lifestyle  . Physical activity:    Days per week: Not on file    Minutes per session: Not on file  . Stress: Not on file  Relationships  . Social connections:    Talks on phone: Not on file    Gets together: Not on file    Attends religious service: Not on file    Active member of club or organization: Not on file    Attends meetings of clubs or organizations: Not on file    Relationship status: Not on file  Other Topics Concern  . Not on file  Social History Narrative  . Not on file   Family History: No family history on file. Allergies: No Known Allergies Medications: See med rec.  Review of Systems: No fevers, chills, night sweats, weight loss, chest pain, or  shortness of breath.   Objective:    General: Well Developed, well nourished, and in no acute distress.  Neuro: Alert and oriented x3, extra-ocular muscles intact, sensation grossly intact.  HEENT: Normocephalic, atraumatic, pupils equal round reactive to light, neck supple, no masses, no lymphadenopathy, thyroid nonpalpable.  Skin: Warm and dry, no rashes. Cardiac: Regular rate and rhythm, no murmurs rubs or gallops, no lower extremity edema.  Respiratory: Clear to auscultation bilaterally. Not using accessory muscles, speaking in full sentences. Right hand: Pain and swelling completely resolved, full range of motion and full strength.  Impression and Recommendations:    Arthralgia of metacarpophalangeal joint, right third Third MCP synovitis is completely resolved with prednisone, icing, hand exercises. If recurrence we will do a full rheumatoid work-up and consider an injection. Otherwise return as needed. ___________________________________________ Gwen Her. Dianah Field, M.D., ABFM., CAQSM. Primary Care and Sports Medicine [facility]  Adjunct Professor of [place] [employer]

## 2018-10-18 ENCOUNTER — Ambulatory Visit (INDEPENDENT_AMBULATORY_CARE_PROVIDER_SITE_OTHER): Payer: BLUE CROSS/BLUE SHIELD | Admitting: Osteopathic Medicine

## 2018-10-18 ENCOUNTER — Encounter: Payer: Self-pay | Admitting: Osteopathic Medicine

## 2018-10-18 VITALS — BP 120/72 | HR 63 | Temp 97.6°F | Ht 62.0 in | Wt 219.0 lb

## 2018-10-18 DIAGNOSIS — R740 Nonspecific elevation of levels of transaminase and lactic acid dehydrogenase [LDH]: Secondary | ICD-10-CM

## 2018-10-18 DIAGNOSIS — I10 Essential (primary) hypertension: Secondary | ICD-10-CM | POA: Diagnosis not present

## 2018-10-18 DIAGNOSIS — Z0000 Encounter for general adult medical examination without abnormal findings: Secondary | ICD-10-CM

## 2018-10-18 DIAGNOSIS — E039 Hypothyroidism, unspecified: Secondary | ICD-10-CM | POA: Diagnosis not present

## 2018-10-18 DIAGNOSIS — R7401 Elevation of levels of liver transaminase levels: Secondary | ICD-10-CM

## 2018-10-18 MED ORDER — LOSARTAN POTASSIUM 50 MG PO TABS: 50.0000 mg | ORAL_TABLET | Freq: Every day | ORAL | 1 refills | Status: DC

## 2018-10-18 MED ORDER — LEVOTHYROXINE SODIUM 112 MCG PO TABS: 112.0000 ug | ORAL_TABLET | Freq: Every day | ORAL | 3 refills | Status: DC

## 2018-10-18 NOTE — Patient Instructions (Signed)
General Preventive Care  Most recent routine screening lipids/other labs: already done.   Will recheck sugars, liver and thyroid in 3 months    Tobacco: don't!  Alcohol: responsible moderation is ok for most adults  Exercise: as tolerated to reduce risk of cardiovascular disease and diabetes. Strength training will also prevent osteoporosis.   Mental health: if need for mental health care (medicines, counseling, other), or concerns about moods, please let me know!   Sexual health: if need for STD testing, or if concerns with libido/pain problems, please let me know!   Advanced Directive: Living Will and/or Healthcare Power of Attorney recommended for all adults, regardless of age or health.  Vaccines  Flu vaccine: recommended for almost everyone, every fall.   Shingles vaccine: Shingrix recommended after age 31.  Pneumonia vaccines: Pneumovax done 12/2012, will repeat age 55   Tetanus booster: Tdap recommended every 10 years - done 07/2018 Cancer screenings   Colon cancer screening: recommended for everyone at age 10, but some folks need a colonoscopy sooner if risk factors or family history.   Breast cancer screening: mammogram recommended at age 68 every other year at least, and annually after age 81. Follow as directed by OBGYN.   Cervical cancer screening: Pap as directed by OBGYN.   Lung cancer screening: not needed if never smoker  Infection screenings . HIV: recommended screening at least once age 53-65, more often as needed. . Gonorrhea/Chlamydia: screening as needed.  . Hepatitis C: recommended for anyone born 68-1964 - not needed for you  . TB: certain at-risk populations. Other . Bone Density Test: recommended for women at age 24, men at age 70, sooner depending on risk factors

## 2018-10-18 NOTE — Progress Notes (Signed)
HPI: Sarah Benjamin is a 47 y.o. female who  has a past medical history of Anemia and Thyroid disease.  she presents to [facility] today, 10/18/18,  for chief complaint of: Annual Physical    Patient here for annual physical / wellness exam.  See preventive care reviewed as below.  Recent labs reviewed in detail with the patient.   Prediabetic A1C  TG better on recheck  TSH better but not quite in range   Additional concerns today include: None    Past medical, surgical, social and family history reviewed:  Patient Active Problem List   Diagnosis Date Noted  . Arthralgia of metacarpophalangeal joint, right third 09/18/2018  . Allergic rhinitis 06/12/2018  . Transaminitis 06/12/2018  . BPPV (benign paroxysmal positional vertigo), right 06/04/2018  . Essential hypertension 02/12/2018  . Numbness and tingling of left side of face 02/12/2018  . Prediabetes 06/21/2017  . Irregular menses 03/30/2017  . Dizziness 04/19/2016  . Seasonal allergies 04/19/2016  . Sinus headache 04/19/2016  . History of Bell's palsy 04/19/2016  . PFO (patent foramen ovale) 01/12/2016  . Pain in the chest 01/12/2016  . Back pain 04/17/2015  . Hip pain 04/17/2015  . Mild persistent asthma without complication 09/32/3557  . Hypothyroid 01/13/2014  . Constipation 01/13/2014  . Migraine 01/13/2014    No past surgical history on file.  Social History   Tobacco Use  . Smoking status: Never Smoker  . Smokeless tobacco: Never Used  Substance Use Topics  . Alcohol use: No    Family History  Problem Relation Age of Onset  . Diabetes Mother   . Diabetes Brother      Current medication list and allergy/intolerance information reviewed:    Current Outpatient Medications  Medication Sig Dispense Refill  . albuterol (PROVENTIL HFA;VENTOLIN HFA) 108 (90 Base) MCG/ACT inhaler Inhale 2 puffs into the lungs every 4 (four) hours as needed for wheezing or  shortness of breath. 2 Inhaler 1  . butalbital-acetaminophen-caffeine (ESGIC) 50-325-40 MG tablet Take 1-2 tablets by mouth 2 (two) times daily as needed for headache. Do not take more than 2 days per week. 14 tablet 0  . EPINEPHrine 0.3 mg/0.3 mL IJ SOAJ injection Inject into the skin.    . fluticasone (FLONASE) 50 MCG/ACT nasal spray Place 2 sprays into both nostrils daily. 16 g 3  . levothyroxine (SYNTHROID, LEVOTHROID) 100 MCG tablet Take 1 tablet (100 mcg total) by mouth daily before breakfast. Must schedule appt before further refills 90 tablet 0  . losartan (COZAAR) 50 MG tablet TAKE 1 TABLET(50 MG) BY MOUTH DAILY 30 tablet 0  . meloxicam (MOBIC) 15 MG tablet Take 1 tablet (15 mg total) by mouth daily as needed for pain. 30 tablet 3  . montelukast (SINGULAIR) 10 MG tablet Take 1 tablet (10 mg total) by mouth at bedtime. 90 tablet 1   No current facility-administered medications for this visit.     No Known Allergies    Review of Systems:  Constitutional:  No  fever, no chills, No recent illness, No unintentional weight changes. No significant fatigue.   HEENT: No  headache, no vision change, no hearing change, No sore throat, No  sinus pressure  Cardiac: No  chest pain, No  pressure, No palpitations, No  Orthopnea  Respiratory:  No  shortness of breath. No  Cough  Gastrointestinal: No  abdominal pain, No  nausea, No  vomiting,  No  blood in stool, No  diarrhea,  No  constipation   Musculoskeletal: No new myalgia/arthralgia  Skin: No  Rash, No other wounds/concerning lesions  Genitourinary: No  incontinence, No  abnormal genital bleeding, No abnormal genital discharge  Hem/Onc: No  easy bruising/bleeding, No  abnormal lymph node  Endocrine: No cold intolerance,  No heat intolerance. No polyuria/polydipsia/polyphagia   Neurologic: No  weakness, No  dizziness, No  slurred speech/focal weakness/facial droop  Psychiatric: No  concerns with depression, No  concerns with  anxiety, No sleep problems, No mood problems  Exam:  BP 120/72   Pulse 63   Temp 97.6 F (36.4 C) (Oral)   Ht 5\' 2"  (1.575 m)   Wt 219 lb (99.3 kg)   BMI 40.06 kg/m   Constitutional: VS see above. General Appearance: alert, well-developed, well-nourished, NAD  Eyes: Normal lids and conjunctive, non-icteric sclera  Ears, Nose, Mouth, Throat: MMM, Normal external inspection ears/nares/mouth/lips/gums. TM normal bilaterally. Pharynx/tonsils no erythema, no exudate. Nasal mucosa normal.   Neck: No masses, trachea midline. No thyroid enlargement. No tenderness/mass appreciated. No lymphadenopathy  Respiratory: Normal respiratory effort. no wheeze, no rhonchi, no rales  Cardiovascular: S1/S2 normal, no murmur, no rub/gallop auscultated. RRR. No lower extremity edema.   Gastrointestinal: Nontender, no masses. No hepatomegaly, no splenomegaly. No hernia appreciated. Bowel sounds normal. Rectal exam deferred.   Musculoskeletal: Gait normal. No clubbing/cyanosis of digits.   Neurological: Normal balance/coordination. No tremor. No cranial nerve deficit on limited exam. Motor and sensation intact and symmetric. Cerebellar reflexes intact.   Skin: warm, dry, intact. No rash/ulcer. No concerning nevi or subq nodules on limited exam.    Psychiatric: Normal judgment/insight. Normal mood and affect. Oriented x3.      ASSESSMENT/PLAN: The primary encounter diagnosis was Annual physical exam. Diagnoses of Hypothyroidism, unspecified type, Hypertension goal BP (blood pressure) < 130/80, and Transaminitis were also pertinent to this visit.   Orders Placed This Encounter - to be drawn in 3 mos  Procedures  . COMPLETE METABOLIC PANEL WITH GFR  . Hemoglobin A1c  . TSH    Meds ordered this encounter  Medications  . levothyroxine (SYNTHROID, LEVOTHROID) 112 MCG tablet    Sig: Take 1 tablet (112 mcg total) by mouth daily before breakfast. Must schedule appt before further refills     Dispense:  90 tablet    Refill:  3  . losartan (COZAAR) 50 MG tablet    Sig: Take 1 tablet (50 mg total) by mouth daily.    Dispense:  90 tablet    Refill:  1   Immunization History  Administered Date(s) Administered  . Pneumococcal Polysaccharide-23 01/09/2013  . Tdap 07/26/2018    Patient Instructions  General Preventive Care  Most recent routine screening lipids/other labs: already done.   Will recheck sugars, liver and thyroid in 3 months    Tobacco: don't!  Alcohol: responsible moderation is ok for most adults  Exercise: as tolerated to reduce risk of cardiovascular disease and diabetes. Strength training will also prevent osteoporosis.   Mental health: if need for mental health care (medicines, counseling, other), or concerns about moods, please let me know!   Sexual health: if need for STD testing, or if concerns with libido/pain problems, please let me know!   Advanced Directive: Living Will and/or Healthcare Power of Attorney recommended for all adults, regardless of age or health.  Vaccines  Flu vaccine: recommended for almost everyone, every fall.   Shingles vaccine: Shingrix recommended after age 29.  Pneumonia vaccines: Pneumovax done 12/2012, will repeat age  65   Tetanus booster: Tdap recommended every 10 years - done 07/2018 Cancer screenings   Colon cancer screening: recommended for everyone at age 61, but some folks need a colonoscopy sooner if risk factors or family history.   Breast cancer screening: mammogram recommended at age 74 every other year at least, and annually after age 80. Follow as directed by OBGYN.   Cervical cancer screening: Pap as directed by OBGYN.   Lung cancer screening: not needed if never smoker  Infection screenings . HIV: recommended screening at least once age 40-65, more often as needed. . Gonorrhea/Chlamydia: screening as needed.  . Hepatitis C: recommended for anyone born 28-1964 - not needed for you  . TB: certain  at-risk populations. Other . Bone Density Test: recommended for women at age 9, men at age 73, sooner depending on risk factors        Visit summary with medication list and pertinent instructions was printed for patient to review. All questions at time of visit were answered - patient instructed to contact office with any additional concerns or updates. ER/RTC precautions were reviewed with the patient.     Please note: voice recognition software was used to produce this document, and typos may escape review. Please contact Dr. Sheppard Coil for any needed clarifications.     Follow-up plan: Return in about 3 months (around 01/17/2019) for repeat labs (sugars, thyroid, liver) before appointment to discuss results w/ Dr A .

## 2018-10-22 DIAGNOSIS — J301 Allergic rhinitis due to pollen: Secondary | ICD-10-CM | POA: Diagnosis not present

## 2018-10-22 DIAGNOSIS — R1031 Right lower quadrant pain: Secondary | ICD-10-CM | POA: Diagnosis not present

## 2018-10-22 DIAGNOSIS — Z01411 Encounter for gynecological examination (general) (routine) with abnormal findings: Secondary | ICD-10-CM | POA: Diagnosis not present

## 2018-10-22 DIAGNOSIS — E039 Hypothyroidism, unspecified: Secondary | ICD-10-CM | POA: Diagnosis not present

## 2018-10-22 DIAGNOSIS — J3081 Allergic rhinitis due to animal (cat) (dog) hair and dander: Secondary | ICD-10-CM | POA: Diagnosis not present

## 2018-10-22 DIAGNOSIS — J3089 Other allergic rhinitis: Secondary | ICD-10-CM | POA: Diagnosis not present

## 2018-10-22 DIAGNOSIS — N920 Excessive and frequent menstruation with regular cycle: Secondary | ICD-10-CM | POA: Diagnosis not present

## 2018-11-01 ENCOUNTER — Encounter: Payer: Self-pay | Admitting: Osteopathic Medicine

## 2018-11-01 DIAGNOSIS — J3089 Other allergic rhinitis: Secondary | ICD-10-CM | POA: Diagnosis not present

## 2018-11-01 DIAGNOSIS — J301 Allergic rhinitis due to pollen: Secondary | ICD-10-CM | POA: Diagnosis not present

## 2018-11-01 DIAGNOSIS — J3081 Allergic rhinitis due to animal (cat) (dog) hair and dander: Secondary | ICD-10-CM | POA: Diagnosis not present

## 2018-11-12 DIAGNOSIS — J301 Allergic rhinitis due to pollen: Secondary | ICD-10-CM | POA: Diagnosis not present

## 2018-11-12 DIAGNOSIS — J3089 Other allergic rhinitis: Secondary | ICD-10-CM | POA: Diagnosis not present

## 2018-11-12 DIAGNOSIS — J3081 Allergic rhinitis due to animal (cat) (dog) hair and dander: Secondary | ICD-10-CM | POA: Diagnosis not present

## 2018-11-14 DIAGNOSIS — U071 COVID-19: Secondary | ICD-10-CM

## 2018-11-14 DIAGNOSIS — J189 Pneumonia, unspecified organism: Secondary | ICD-10-CM

## 2018-11-14 HISTORY — DX: COVID-19: U07.1

## 2018-11-14 HISTORY — DX: Pneumonia, unspecified organism: J18.9

## 2018-11-28 ENCOUNTER — Ambulatory Visit: Payer: BLUE CROSS/BLUE SHIELD | Admitting: Osteopathic Medicine

## 2018-11-28 ENCOUNTER — Encounter: Payer: Self-pay | Admitting: Osteopathic Medicine

## 2018-11-28 DIAGNOSIS — B9789 Other viral agents as the cause of diseases classified elsewhere: Secondary | ICD-10-CM | POA: Diagnosis not present

## 2018-11-28 DIAGNOSIS — J069 Acute upper respiratory infection, unspecified: Secondary | ICD-10-CM | POA: Diagnosis not present

## 2018-11-28 DIAGNOSIS — R6889 Other general symptoms and signs: Secondary | ICD-10-CM | POA: Diagnosis not present

## 2018-11-28 LAB — POCT INFLUENZA A/B
Influenza A, POC: NEGATIVE
Influenza B, POC: NEGATIVE

## 2018-11-28 MED ORDER — IPRATROPIUM BROMIDE 0.06 % NA SOLN: 2.0000 | Freq: Four times a day (QID) | NASAL | 1 refills | Status: DC

## 2018-11-28 MED ORDER — BENZONATATE 200 MG PO CAPS: 200.0000 mg | ORAL_CAPSULE | Freq: Three times a day (TID) | ORAL | 1 refills | Status: DC | PRN

## 2018-11-28 MED ORDER — LIDOCAINE VISCOUS HCL 2 % MT SOLN: 5.0000 mL | OROMUCOSAL | 1 refills | Status: DC | PRN

## 2018-11-28 MED ORDER — PREDNISONE 20 MG PO TABS: 20.0000 mg | ORAL_TABLET | Freq: Two times a day (BID) | ORAL | 0 refills | Status: DC

## 2018-11-28 NOTE — Patient Instructions (Signed)
Negative for flu  See below for medications I think this is a viral issue Please call me if severe sinus congestion, or cough getting worse    Medications & Home Remedies for Upper Respiratory Illness   Aches/Pains, Fever, Headache OTC Acetaminophen (Tylenol) 500 mg tablets - take max 2 tablets (1000 mg) every 6 hours (4 times per day)  OTC Ibuprofen (Motrin) 200 mg tablets - take max 4 tablets (800 mg) every 6 hours*  Sinus Congestion Prescription Atrovent as directed OTC Nasal Saline if desired to rinse OTC Oxymetolazone (Afrin, others) sparing use due to rebound congestion, NEVER use in kids OTC Phenylephrine (Sudafed) 10 mg tablets every 4 hours (or the 12-hour formulation) OTC Diphenhydramine (Benadryl) 25 mg tablets - take max 2 tablets every 4 hours   Cough & Sore Throat Prescription cough pills or syrups as directed OTC Dextromethorphan (Robitussin, others) - cough suppressant OTC Guaifenesin (Robitussin, Mucinex, others) - expectorant (helps cough up mucus) (Dextromethorphan and Guaifenesin also come in a combination tablet/syrup) OTC Lozenges w/ Benzocaine + Menthol (Cepacol) Honey - as much as you want! Teas which "coat the throat" - look for ingredients Elm Bark, Licorice Root, Marshmallow Root   Other Prescription Oral Steroids to decrease inflammation and improve energy OTC Zinc Lozenges within 24 hours of symptoms onset -may shorten the common cold Don't waste your money on Vitamin C or Echinacea in acute illness - it's already too late!

## 2018-11-28 NOTE — Progress Notes (Signed)
HPI: Sarah Benjamin is a 48 y.o. female who  has a past medical history of Anemia and Thyroid disease.  she presents to [facility] today, 11/28/18,  for chief complaint of: Sick, respiratory   . Context: sick contacts at work w/ similar symptoms.  . Location/Quality: sore throat, coughing, body aches, subjective fever, chills, headache, sinus pressure  . Severity: getting worse . Duration/Timing: 3 weeks, on and off  . Modifying factors: has tried TheraFlu, ginger tea w/ honey       Past medical, surgical, social and family history reviewed and updated as necessary.   Current medication list and allergy/intolerance information reviewed:    Current Outpatient Medications  Medication Sig Dispense Refill  . albuterol (PROVENTIL HFA;VENTOLIN HFA) 108 (90 Base) MCG/ACT inhaler Inhale 2 puffs into the lungs every 4 (four) hours as needed for wheezing or shortness of breath. 2 Inhaler 1  . EPINEPHrine 0.3 mg/0.3 mL IJ SOAJ injection Inject into the skin.    . fluticasone (FLONASE) 50 MCG/ACT nasal spray Place 2 sprays into both nostrils daily. 16 g 3  . levothyroxine (SYNTHROID, LEVOTHROID) 112 MCG tablet Take 1 tablet (112 mcg total) by mouth daily before breakfast. Must schedule appt before further refills 90 tablet 3  . losartan (COZAAR) 50 MG tablet Take 1 tablet (50 mg total) by mouth daily. 90 tablet 1  . meloxicam (MOBIC) 15 MG tablet Take 1 tablet (15 mg total) by mouth daily as needed for pain. 30 tablet 3   No current facility-administered medications for this visit.     No Known Allergies    Review of Systems:  Constitutional:  +subjective fever, +chills, +recent illness, No unintentional weight changes. +significant fatigue.   HEENT: +headache, no vision change, no hearing change, +sore throat, +sinus pressure  Cardiac: No  chest pain, No  pressure, No palpitations  Respiratory:  No  shortness of breath.  +Cough  Gastrointestinal: No  abdominal pain, No  nausea, No  vomiting,  No  blood in stool, No  diarrhea  Musculoskeletal: No new myalgia/arthralgia  Skin: No  Rash  Neurologic: No  weakness, No  dizziness  Exam:  BP 131/67 (BP Location: Left Arm, Patient Position: Sitting, Cuff Size: Normal)   Pulse 64   Temp 98.5 F (36.9 C) (Oral)   Wt 217 lb 12.8 oz (98.8 kg)   SpO2 98%   BMI 39.84 kg/m   Constitutional: VS see above. General Appearance: alert, well-developed, well-nourished, NAD  Eyes: Normal lids and conjunctive, non-icteric sclera  Ears, Nose, Mouth, Throat: MMM, Normal external inspection ears/nares/mouth/lips/gums. TM normal bilaterally. Pharynx/tonsils +erythema, no exudate. Nasal mucosa normal.   Neck: No masses, trachea midline. No tenderness/mass appreciated. No lymphadenopathy but +tender bilateral anterior cervical LN   Respiratory: Normal respiratory effort. no wheeze, no rhonchi, no rales  Cardiovascular: S1/S2 normal, no murmur, no rub/gallop auscultated. RRR. No lower extremity edema.   Musculoskeletal: Gait normal.   Neurological: Normal balance/coordination. No tremor.   Skin: warm, dry, intact.   Psychiatric: Normal judgment/insight. Normal mood and affect.   Results for orders placed or performed in visit on 11/28/18 (from the past 72 hour(s))  POCT Influenza A/B     Status: None   Collection Time: 11/28/18 11:10 AM  Result Value Ref Range   Influenza A, POC Negative Negative   Influenza B, POC Negative Negative    No results found.       ASSESSMENT/PLAN: Diagnoses of Flu-like symptoms and Viral URI  with cough were pertinent to this visit.   Meds ordered this encounter  Medications  . predniSONE (DELTASONE) 20 MG tablet    Sig: Take 1 tablet (20 mg total) by mouth 2 (two) times daily with a meal.    Dispense:  10 tablet    Refill:  0  . ipratropium (ATROVENT) 0.06 % nasal spray    Sig: Place 2 sprays into both nostrils 4 (four)  times daily. As needed for sinus congestion    Dispense:  15 mL    Refill:  1  . benzonatate (TESSALON) 200 MG capsule    Sig: Take 1 capsule (200 mg total) by mouth 3 (three) times daily as needed for cough.    Dispense:  30 capsule    Refill:  1  . lidocaine (XYLOCAINE) 2 % solution    Sig: Use as directed 5-10 mLs in the mouth or throat every 3 (three) hours as needed (throat pain).    Dispense:  100 mL    Refill:  1    Orders Placed This Encounter  Procedures  . POCT Influenza A/B    Patient Instructions  Negative for flu  See below for medications I think this is a viral issue Please call me if severe sinus congestion, or cough getting worse    Medications & Home Remedies for Upper Respiratory Illness   Aches/Pains, Fever, Headache OTC Acetaminophen (Tylenol) 500 mg tablets - take max 2 tablets (1000 mg) every 6 hours (4 times per day)  OTC Ibuprofen (Motrin) 200 mg tablets - take max 4 tablets (800 mg) every 6 hours*  Sinus Congestion Prescription Atrovent as directed OTC Nasal Saline if desired to rinse OTC Oxymetolazone (Afrin, others) sparing use due to rebound congestion, NEVER use in kids OTC Phenylephrine (Sudafed) 10 mg tablets every 4 hours (or the 12-hour formulation) OTC Diphenhydramine (Benadryl) 25 mg tablets - take max 2 tablets every 4 hours   Cough & Sore Throat Prescription cough pills or syrups as directed OTC Dextromethorphan (Robitussin, others) - cough suppressant OTC Guaifenesin (Robitussin, Mucinex, others) - expectorant (helps cough up mucus) (Dextromethorphan and Guaifenesin also come in a combination tablet/syrup) OTC Lozenges w/ Benzocaine + Menthol (Cepacol) Honey - as much as you want! Teas which "coat the throat" - look for ingredients Elm Bark, Licorice Root, Marshmallow Root   Other Prescription Oral Steroids to decrease inflammation and improve energy OTC Zinc Lozenges within 24 hours of symptoms onset -may shorten the common  cold Don't waste your money on Vitamin C or Echinacea in acute illness - it's already too late!         Visit summary with medication list and pertinent instructions was printed for patient to review. All questions at time of visit were answered - patient instructed to contact office with any additional concerns or updates. ER/RTC precautions were reviewed with the patient.   Follow-up plan: Return if symptoms worsen or fail to improve - call or message me! .    Please note: voice recognition software was used to produce this document, and typos may escape review. Please contact Dr. Sheppard Coil for any needed clarifications.

## 2018-12-13 DIAGNOSIS — J3081 Allergic rhinitis due to animal (cat) (dog) hair and dander: Secondary | ICD-10-CM | POA: Diagnosis not present

## 2018-12-13 DIAGNOSIS — J3089 Other allergic rhinitis: Secondary | ICD-10-CM | POA: Diagnosis not present

## 2018-12-13 DIAGNOSIS — J301 Allergic rhinitis due to pollen: Secondary | ICD-10-CM | POA: Diagnosis not present

## 2019-01-16 DIAGNOSIS — I10 Essential (primary) hypertension: Secondary | ICD-10-CM | POA: Diagnosis not present

## 2019-01-16 DIAGNOSIS — E039 Hypothyroidism, unspecified: Secondary | ICD-10-CM | POA: Diagnosis not present

## 2019-01-16 DIAGNOSIS — R740 Nonspecific elevation of levels of transaminase and lactic acid dehydrogenase [LDH]: Secondary | ICD-10-CM | POA: Diagnosis not present

## 2019-01-17 DIAGNOSIS — J3089 Other allergic rhinitis: Secondary | ICD-10-CM | POA: Diagnosis not present

## 2019-01-17 DIAGNOSIS — J3081 Allergic rhinitis due to animal (cat) (dog) hair and dander: Secondary | ICD-10-CM | POA: Diagnosis not present

## 2019-01-17 DIAGNOSIS — J301 Allergic rhinitis due to pollen: Secondary | ICD-10-CM | POA: Diagnosis not present

## 2019-01-17 LAB — COMPLETE METABOLIC PANEL WITH GFR
AG Ratio: 1.5 (calc) (ref 1.0–2.5)
ALT: 41 U/L — ABNORMAL HIGH (ref 6–29)
AST: 31 U/L (ref 10–35)
Albumin: 4 g/dL (ref 3.6–5.1)
Alkaline phosphatase (APISO): 76 U/L (ref 31–125)
BUN: 15 mg/dL (ref 7–25)
CO2: 27 mmol/L (ref 20–32)
Calcium: 9 mg/dL (ref 8.6–10.2)
Chloride: 106 mmol/L (ref 98–110)
Creat: 0.71 mg/dL (ref 0.50–1.10)
GFR, Est African American: 118 mL/min/{1.73_m2} (ref >=60)
GFR, Est Non African American: 102 mL/min/{1.73_m2} (ref >=60)
Globulin: 2.7 g/dL (calc) (ref 1.9–3.7)
Glucose, Bld: 96 mg/dL (ref 65–99)
Potassium: 4.4 mmol/L (ref 3.5–5.3)
Sodium: 139 mmol/L (ref 135–146)
Total Bilirubin: 0.8 mg/dL (ref 0.2–1.2)
Total Protein: 6.7 g/dL (ref 6.1–8.1)

## 2019-01-17 LAB — HEMOGLOBIN A1C
Hgb A1c MFr Bld: 6.4 % of total Hgb — ABNORMAL HIGH (ref <5.7)
Mean Plasma Glucose: 137 (calc)
eAG (mmol/L): 7.6 (calc)

## 2019-01-17 LAB — TSH: TSH: 3.27 mIU/L

## 2019-01-22 ENCOUNTER — Encounter: Payer: Self-pay | Admitting: Osteopathic Medicine

## 2019-01-22 ENCOUNTER — Ambulatory Visit (INDEPENDENT_AMBULATORY_CARE_PROVIDER_SITE_OTHER): Payer: Self-pay | Admitting: Osteopathic Medicine

## 2019-01-22 VITALS — BP 131/70 | HR 63 | Temp 98.4°F | Wt 220.0 lb

## 2019-01-22 DIAGNOSIS — R7401 Elevation of levels of liver transaminase levels: Secondary | ICD-10-CM

## 2019-01-22 DIAGNOSIS — R7303 Prediabetes: Secondary | ICD-10-CM

## 2019-01-22 DIAGNOSIS — I10 Essential (primary) hypertension: Secondary | ICD-10-CM

## 2019-01-22 DIAGNOSIS — E039 Hypothyroidism, unspecified: Secondary | ICD-10-CM

## 2019-01-22 DIAGNOSIS — R740 Nonspecific elevation of levels of transaminase and lactic acid dehydrogenase [LDH]: Secondary | ICD-10-CM

## 2019-01-22 DIAGNOSIS — J309 Allergic rhinitis, unspecified: Secondary | ICD-10-CM

## 2019-01-22 MED ORDER — FLUTICASONE PROPIONATE 50 MCG/ACT NA SUSP: 2.0000 | Freq: Every day | NASAL | 3 refills | Status: DC

## 2019-01-22 MED ORDER — METFORMIN HCL ER 500 MG PO TB24: 500.0000 mg | ORAL_TABLET | Freq: Every day | ORAL | 3 refills | Status: DC

## 2019-01-22 NOTE — Patient Instructions (Signed)

## 2019-01-22 NOTE — Progress Notes (Signed)
HPI: Sarah Benjamin is a 48 y.o. female who  has a past medical history of Anemia and Thyroid disease.  she presents to [facility] today, 01/22/19,  for chief complaint of:  New labs: Follow-up prediabetes, elevated liver enzymes, thyroid check  Liver enzymes are looking better, A1c is still in prediabetic range, went from 6.2 on 07/26/2018 up to 6.4.    Liver enzymes improved, AST went from 62 on 06/04/2018 down to 31, ALT went from 99 on 07/22/19down to 41.  Patient has been exercising as able, having some low back/leg pain on occasion which hinders her a bit.  Has tried cutting down as much as she can on carbohydrates, breads, pastas    At today's visit 01/22/19 ... PMH, PSH, FH reviewed and updated as needed.  Current medication list and allergy/intolerance hx reviewed and updated as needed. (See remainder of HPI, ROS, Phys Exam below)          ASSESSMENT/PLAN: The primary encounter diagnosis was Prediabetes. Diagnoses of Allergic rhinitis, unspecified seasonality, unspecified trigger, Hypothyroidism, unspecified type, Transaminitis, and Hypertension goal BP (blood pressure) < 130/80 were also pertinent to this visit.   Will initiate metformin, patient educated on diet/exercise modifications,  Orders Placed This Encounter  Procedures  . COMPLETE METABOLIC PANEL WITH GFR  . Lipid panel  . Hemoglobin A1c     Meds ordered this encounter  Medications  . fluticasone (FLONASE) 50 MCG/ACT nasal spray    Sig: Place 2 sprays into both nostrils daily.    Dispense:  48 g    Refill:  3  . metFORMIN (GLUCOPHAGE XR) 500 MG 24 hr tablet    Sig: Take 1 tablet (500 mg total) by mouth daily.    Dispense:  90 tablet    Refill:  3    See patient printed information on preventing type 2 diabetes/prediabetes diet    Follow-up plan: Return in about 3 months (around 04/24/2019) for labs prior to visit - A1C and liver enzymes - see me sooner if  needed.                                                 ################################################# ################################################# ################################################# #################################################    Current Meds  Medication Sig  . albuterol (PROVENTIL HFA;VENTOLIN HFA) 108 (90 Base) MCG/ACT inhaler Inhale 2 puffs into the lungs every 4 (four) hours as needed for wheezing or shortness of breath.  . EPINEPHrine 0.3 mg/0.3 mL IJ SOAJ injection Inject into the skin.  . fluticasone (FLONASE) 50 MCG/ACT nasal spray Place 2 sprays into both nostrils daily.  Marland Kitchen ipratropium (ATROVENT) 0.06 % nasal spray Place 2 sprays into both nostrils 4 (four) times daily. As needed for sinus congestion  . levothyroxine (SYNTHROID, LEVOTHROID) 112 MCG tablet Take 1 tablet (112 mcg total) by mouth daily before breakfast. Must schedule appt before further refills  . losartan (COZAAR) 50 MG tablet Take 1 tablet (50 mg total) by mouth daily.    No Known Allergies     Review of Systems:  Constitutional: No recent illness except allergies  Respiratory:  No  shortness of breath. No  Cough  Gastrointestinal: No  abdominal pain, no change on bowel habits   Exam:  BP 131/70 (BP Location: Left Arm, Patient Position: Sitting, Cuff Size: Normal)   Pulse 63   Temp  98.4 F (36.9 C) (Oral)   Wt 220 lb (99.8 kg)   BMI 40.24 kg/m   Constitutional: VS see above. General Appearance: alert, well-developed, well-nourished, NAD  Respiratory: Normal respiratory effort.  Neurological: Normal balance/coordination. No tremor.  Skin: warm, dry, intact.   Psychiatric: Normal judgment/insight. Normal mood and affect. Oriented x3.       Visit summary with medication list and pertinent instructions was printed for patient to review, patient was advised to alert [place] if any updates are needed. All questions at time  of visit were answered - patient instructed to contact office with any additional concerns. ER/RTC precautions were reviewed with the patient and understanding verbalized.   Note: Total time spent 25 minutes, greater than 50% of the visit was spent face-to-face counseling and coordinating care for the following: The primary encounter diagnosis was Prediabetes. Diagnoses of Allergic rhinitis, unspecified seasonality, unspecified trigger, Hypothyroidism, unspecified type, Transaminitis, and Hypertension goal BP (blood pressure) < 130/80 were also pertinent to this visit.Marland Kitchen  Please note: voice recognition software was used to produce this document, and typos may escape review. Please contact Dr. Sheppard Coil for any needed clarifications.    Follow up plan: Return in about 3 months (around 04/24/2019) for labs prior to visit - A1C and liver enzymes - see me sooner if needed.

## 2019-01-29 ENCOUNTER — Other Ambulatory Visit: Payer: Self-pay

## 2019-01-29 ENCOUNTER — Encounter: Payer: Self-pay | Admitting: Physician Assistant

## 2019-01-29 ENCOUNTER — Ambulatory Visit (INDEPENDENT_AMBULATORY_CARE_PROVIDER_SITE_OTHER): Payer: BLUE CROSS/BLUE SHIELD | Admitting: Physician Assistant

## 2019-01-29 VITALS — BP 144/80 | HR 66 | Temp 99.3°F | Ht 62.0 in | Wt 220.3 lb

## 2019-01-29 DIAGNOSIS — R059 Cough, unspecified: Secondary | ICD-10-CM

## 2019-01-29 DIAGNOSIS — R52 Pain, unspecified: Secondary | ICD-10-CM | POA: Diagnosis not present

## 2019-01-29 DIAGNOSIS — J3089 Other allergic rhinitis: Secondary | ICD-10-CM | POA: Diagnosis not present

## 2019-01-29 DIAGNOSIS — R05 Cough: Secondary | ICD-10-CM | POA: Diagnosis not present

## 2019-01-29 DIAGNOSIS — J3081 Allergic rhinitis due to animal (cat) (dog) hair and dander: Secondary | ICD-10-CM | POA: Diagnosis not present

## 2019-01-29 DIAGNOSIS — J0140 Acute pansinusitis, unspecified: Secondary | ICD-10-CM

## 2019-01-29 DIAGNOSIS — J301 Allergic rhinitis due to pollen: Secondary | ICD-10-CM | POA: Diagnosis not present

## 2019-01-29 LAB — POCT INFLUENZA A/B
Influenza A, POC: NEGATIVE
Influenza B, POC: NEGATIVE

## 2019-01-29 MED ORDER — KETOROLAC TROMETHAMINE 30 MG/ML IJ SOLN
30.0000 mg | Freq: Once | INTRAMUSCULAR | Status: AC
  Administered 2019-01-29: 30 mg via INTRAMUSCULAR

## 2019-01-29 MED ORDER — BENZONATATE 200 MG PO CAPS: 200.0000 mg | ORAL_CAPSULE | Freq: Two times a day (BID) | ORAL | 0 refills | Status: DC | PRN

## 2019-01-29 MED ORDER — AMOXICILLIN-POT CLAVULANATE 875-125 MG PO TABS: 1.0000 | ORAL_TABLET | Freq: Two times a day (BID) | ORAL | 0 refills | Status: DC

## 2019-01-29 NOTE — Patient Instructions (Signed)

## 2019-01-29 NOTE — Progress Notes (Signed)
Subjective:    Patient ID: Sarah Benjamin, female    DOB: 1970-12-28, 48 y.o.   MRN: [id number]  HPI  Pt is a 48 yo female with a hx of allergies and asthma who presents to the clinic with headache, sinus pressure, dry cough, body aches and low grade fever. Her throat feels "a little scratchy". No SOB, wheezing, chest tightness. Symptoms started 6 days ago but the body aches and headache worsened yesterday. No travel. No known sick contacts. She thought at first it was her allergies and started taking OTC allergy medications. No improvement.   .. Active Ambulatory Problems    Diagnosis Date Noted  . PFO (patent foramen ovale) 01/12/2016  . Pain in the chest 01/12/2016  . Dizziness 04/19/2016  . Hypothyroid 01/13/2014  . Seasonal allergies 04/19/2016  . Sinus headache 04/19/2016  . History of Bell's palsy 04/19/2016  . Mild persistent asthma without complication 83/38/2505  . Back pain 04/17/2015  . Constipation 01/13/2014  . Essential hypertension 02/12/2018  . Hip pain 04/17/2015  . Irregular menses 03/30/2017  . Migraine 01/13/2014  . Numbness and tingling of left side of face 02/12/2018  . Prediabetes 06/21/2017  . BPPV (benign paroxysmal positional vertigo), right 06/04/2018  . Allergic rhinitis 06/12/2018  . Transaminitis 06/12/2018  . Arthralgia of metacarpophalangeal joint, right third 09/18/2018   Resolved Ambulatory Problems    Diagnosis Date Noted  . Hypothyroidism 02/08/2012   Past Medical History:  Diagnosis Date  . Anemia   . Thyroid disease     Review of Systems See HPI.     Objective:   Physical Exam Vitals signs reviewed.  Constitutional:      Appearance: Normal appearance.  HENT:     Head: Normocephalic.     Comments: Lots of sinus tenderness over maxillary, frontal and nasal bridge(ethmoid).     Right Ear: Tympanic membrane and ear canal normal.     Left Ear: Tympanic membrane and ear canal normal.     Nose: Congestion present.   Mouth/Throat:     Pharynx: Posterior oropharyngeal erythema present.  Eyes:     Conjunctiva/sclera: Conjunctivae normal.  Cardiovascular:     Rate and Rhythm: Normal rate and regular rhythm.     Pulses: Normal pulses.     Heart sounds: Murmur present.  Pulmonary:     Effort: Pulmonary effort is normal.     Breath sounds: Normal breath sounds.  Neurological:     General: No focal deficit present.     Mental Status: She is alert and oriented to person, place, and time.  Psychiatric:        Mood and Affect: Mood normal.           Assessment & Plan:  Marland KitchenMarland KitchenAna was seen today for cough.  Diagnoses and all orders for this visit:  Acute non-recurrent pansinusitis -     amoxicillin-clavulanate (AUGMENTIN) 875-125 MG tablet; Take 1 tablet by mouth 2 (two) times daily. -     ketorolac (TORADOL) 30 MG/ML injection 30 mg  Body aches -     POCT Influenza A/B  Cough -     benzonatate (TESSALON) 200 MG capsule; Take 1 capsule (200 mg total) by mouth 2 (two) times daily as needed for cough.   .. Results for orders placed or performed in visit on 01/29/19  POCT Influenza A/B  Result Value Ref Range   Influenza A, POC Negative Negative   Influenza B, POC Negative Negative   Checked for flu  since symptoms did worsen yesterday. Negative. Suspect sinusitis. Treated with augmentin, flonase. toradol given for headache today. Tessalon for cough. Rest and hydrate.

## 2019-01-30 ENCOUNTER — Encounter: Payer: Self-pay | Admitting: Physician Assistant

## 2019-02-04 DIAGNOSIS — J3081 Allergic rhinitis due to animal (cat) (dog) hair and dander: Secondary | ICD-10-CM | POA: Diagnosis not present

## 2019-02-04 DIAGNOSIS — J301 Allergic rhinitis due to pollen: Secondary | ICD-10-CM | POA: Diagnosis not present

## 2019-02-04 DIAGNOSIS — J3089 Other allergic rhinitis: Secondary | ICD-10-CM | POA: Diagnosis not present

## 2019-02-12 DIAGNOSIS — J301 Allergic rhinitis due to pollen: Secondary | ICD-10-CM | POA: Diagnosis not present

## 2019-02-12 DIAGNOSIS — J3081 Allergic rhinitis due to animal (cat) (dog) hair and dander: Secondary | ICD-10-CM | POA: Diagnosis not present

## 2019-02-12 DIAGNOSIS — J3089 Other allergic rhinitis: Secondary | ICD-10-CM | POA: Diagnosis not present

## 2019-04-23 ENCOUNTER — Ambulatory Visit: Payer: Self-pay | Admitting: Osteopathic Medicine

## 2019-05-01 DIAGNOSIS — Z20828 Contact with and (suspected) exposure to other viral communicable diseases: Secondary | ICD-10-CM | POA: Diagnosis not present

## 2019-05-01 DIAGNOSIS — M791 Myalgia, unspecified site: Secondary | ICD-10-CM | POA: Diagnosis not present

## 2019-05-01 DIAGNOSIS — R05 Cough: Secondary | ICD-10-CM | POA: Diagnosis not present

## 2019-05-01 DIAGNOSIS — R51 Headache: Secondary | ICD-10-CM | POA: Diagnosis not present

## 2019-06-10 ENCOUNTER — Other Ambulatory Visit: Payer: Self-pay

## 2019-06-10 DIAGNOSIS — E079 Disorder of thyroid, unspecified: Secondary | ICD-10-CM | POA: Diagnosis not present

## 2019-06-10 DIAGNOSIS — K760 Fatty (change of) liver, not elsewhere classified: Secondary | ICD-10-CM | POA: Diagnosis not present

## 2019-06-10 DIAGNOSIS — B349 Viral infection, unspecified: Secondary | ICD-10-CM | POA: Diagnosis not present

## 2019-06-10 DIAGNOSIS — J45909 Unspecified asthma, uncomplicated: Secondary | ICD-10-CM | POA: Diagnosis not present

## 2019-06-10 DIAGNOSIS — Z7951 Long term (current) use of inhaled steroids: Secondary | ICD-10-CM | POA: Diagnosis not present

## 2019-06-10 DIAGNOSIS — Z79899 Other long term (current) drug therapy: Secondary | ICD-10-CM | POA: Diagnosis not present

## 2019-06-10 DIAGNOSIS — N3000 Acute cystitis without hematuria: Secondary | ICD-10-CM | POA: Diagnosis not present

## 2019-06-10 DIAGNOSIS — R110 Nausea: Secondary | ICD-10-CM | POA: Diagnosis not present

## 2019-06-10 DIAGNOSIS — U071 COVID-19: Secondary | ICD-10-CM | POA: Diagnosis not present

## 2019-06-10 DIAGNOSIS — J1289 Other viral pneumonia: Secondary | ICD-10-CM | POA: Diagnosis not present

## 2019-06-10 DIAGNOSIS — R509 Fever, unspecified: Secondary | ICD-10-CM | POA: Diagnosis not present

## 2019-06-10 DIAGNOSIS — R1084 Generalized abdominal pain: Secondary | ICD-10-CM | POA: Diagnosis not present

## 2019-06-10 DIAGNOSIS — K429 Umbilical hernia without obstruction or gangrene: Secondary | ICD-10-CM | POA: Diagnosis not present

## 2019-06-10 DIAGNOSIS — R109 Unspecified abdominal pain: Secondary | ICD-10-CM | POA: Diagnosis not present

## 2019-06-11 ENCOUNTER — Ambulatory Visit: Payer: BC Managed Care – PPO | Admitting: Physician Assistant

## 2019-06-20 ENCOUNTER — Other Ambulatory Visit: Payer: Self-pay

## 2019-06-20 DIAGNOSIS — Z20822 Contact with and (suspected) exposure to covid-19: Secondary | ICD-10-CM

## 2019-06-21 LAB — NOVEL CORONAVIRUS, NAA: SARS-CoV-2, NAA: NOT DETECTED

## 2019-07-18 ENCOUNTER — Telehealth: Payer: Self-pay

## 2019-07-18 DIAGNOSIS — R7303 Prediabetes: Secondary | ICD-10-CM

## 2019-07-18 DIAGNOSIS — I10 Essential (primary) hypertension: Secondary | ICD-10-CM

## 2019-07-18 DIAGNOSIS — E039 Hypothyroidism, unspecified: Secondary | ICD-10-CM

## 2019-07-18 NOTE — Telephone Encounter (Signed)
Pt called requesting to add on additional lab work - liver, kidney and thyroid tests to the order placed 01/22/19. As per pt, she was tested for COVID on 06/20/19, results were negative. Pt was informed by ER provider to f/u appt with provider. Pt is requesting to make an in office visit to go over results and hospital visit. Pls advise, thank.

## 2019-07-19 NOTE — Telephone Encounter (Signed)
Orders are in, can go to lab and we can have a virtual visit a few days after then to go over results. If at the virtual visit I think she needs to come in, we can arrange that.

## 2019-07-19 NOTE — Telephone Encounter (Signed)
Left pt msg advising of provider's note.  

## 2019-07-23 DIAGNOSIS — I10 Essential (primary) hypertension: Secondary | ICD-10-CM | POA: Diagnosis not present

## 2019-07-23 DIAGNOSIS — E039 Hypothyroidism, unspecified: Secondary | ICD-10-CM | POA: Diagnosis not present

## 2019-07-23 DIAGNOSIS — R7303 Prediabetes: Secondary | ICD-10-CM | POA: Diagnosis not present

## 2019-07-24 LAB — COMPLETE METABOLIC PANEL WITH GFR
AG Ratio: 1.6 (calc) (ref 1.0–2.5)
ALT: 78 U/L — ABNORMAL HIGH (ref 6–29)
AST: 40 U/L — ABNORMAL HIGH (ref 10–35)
Albumin: 4.2 g/dL (ref 3.6–5.1)
Alkaline phosphatase (APISO): 75 U/L (ref 31–125)
BUN: 11 mg/dL (ref 7–25)
CO2: 23 mmol/L (ref 20–32)
Calcium: 9.3 mg/dL (ref 8.6–10.2)
Chloride: 105 mmol/L (ref 98–110)
Creat: 0.69 mg/dL (ref 0.50–1.10)
GFR, Est African American: 121 mL/min/{1.73_m2} (ref >=60)
GFR, Est Non African American: 104 mL/min/{1.73_m2} (ref >=60)
Globulin: 2.6 g/dL (calc) (ref 1.9–3.7)
Glucose, Bld: 106 mg/dL — ABNORMAL HIGH (ref 65–99)
Potassium: 4.7 mmol/L (ref 3.5–5.3)
Sodium: 138 mmol/L (ref 135–146)
Total Bilirubin: 0.5 mg/dL (ref 0.2–1.2)
Total Protein: 6.8 g/dL (ref 6.1–8.1)

## 2019-07-24 LAB — LIPID PANEL
Cholesterol: 179 mg/dL (ref <200)
HDL: 40 mg/dL — ABNORMAL LOW (ref >=50)
LDL Cholesterol (Calc): 111 mg/dL (calc) — ABNORMAL HIGH
Non-HDL Cholesterol (Calc): 139 mg/dL (calc) — ABNORMAL HIGH (ref <130)
Total CHOL/HDL Ratio: 4.5 (calc) (ref <5.0)
Triglycerides: 165 mg/dL — ABNORMAL HIGH (ref <150)

## 2019-07-24 LAB — TSH: TSH: 13.33 mIU/L — ABNORMAL HIGH

## 2019-07-24 LAB — HEMOGLOBIN A1C
Hgb A1c MFr Bld: 6.2 % of total Hgb — ABNORMAL HIGH (ref <5.7)
Mean Plasma Glucose: 131 (calc)
eAG (mmol/L): 7.3 (calc)

## 2019-07-24 LAB — CBC
HCT: 44.1 % (ref 35.0–45.0)
Hemoglobin: 14.3 g/dL (ref 11.7–15.5)
MCH: 28.9 pg (ref 27.0–33.0)
MCHC: 32.4 g/dL (ref 32.0–36.0)
MCV: 89.1 fL (ref 80.0–100.0)
MPV: 14.5 fL — ABNORMAL HIGH (ref 7.5–12.5)
Platelets: 199 10*3/uL (ref 140–400)
RBC: 4.95 10*6/uL (ref 3.80–5.10)
RDW: 15.1 % — ABNORMAL HIGH (ref 11.0–15.0)
WBC: 6.4 10*3/uL (ref 3.8–10.8)

## 2019-07-29 ENCOUNTER — Ambulatory Visit (INDEPENDENT_AMBULATORY_CARE_PROVIDER_SITE_OTHER): Payer: BC Managed Care – PPO | Admitting: Osteopathic Medicine

## 2019-07-29 ENCOUNTER — Encounter: Payer: Self-pay | Admitting: Osteopathic Medicine

## 2019-07-29 VITALS — BP 154/95 | HR 71 | Wt 215.0 lb

## 2019-07-29 DIAGNOSIS — E039 Hypothyroidism, unspecified: Secondary | ICD-10-CM

## 2019-07-29 DIAGNOSIS — R7303 Prediabetes: Secondary | ICD-10-CM

## 2019-07-29 DIAGNOSIS — I10 Essential (primary) hypertension: Secondary | ICD-10-CM

## 2019-07-29 MED ORDER — METFORMIN HCL ER 500 MG PO TB24: 500.0000 mg | ORAL_TABLET | Freq: Every day | ORAL | 3 refills | Status: DC

## 2019-07-29 MED ORDER — LEVOTHYROXINE SODIUM 112 MCG PO TABS: 112.0000 ug | ORAL_TABLET | Freq: Every day | ORAL | 3 refills | Status: DC

## 2019-07-29 MED ORDER — LOSARTAN POTASSIUM-HCTZ 50-12.5 MG PO TABS: 1.0000 | ORAL_TABLET | Freq: Every day | ORAL | 3 refills | Status: DC

## 2019-07-29 NOTE — Progress Notes (Signed)
Virtual Visit via Phone  I connected with      Sarah Benjamin on 07/29/19 at 3:30 PM by a telemedicine application and verified that I am speaking with the correct person using two identifiers.  Patient is at home I am in office   I discussed the limitations of evaluation and management by telemedicine and the availability of in person appointments. The patient expressed understanding and agreed to proceed.  History of Present Illness: Sarah Benjamin is a 48 y.o. female who would like to discuss lab results -overdue for prediabetes follow-up  Patient on the phone to discuss lab results.  Unable to do video visit due to technical difficulties.    Recent labs, ordered routinely, patient is overdue for follow-up with prediabetes.    A1c is stable but triglycerides, fasting sugar, liver enzymes, TSH are out of range.   She states she has been out of her thyroid medication.    States she also has concerns about blood pressure being a bit on the high side, systolic pretty consistently in the 150s at home.  No chest pain, pressure, shortness of breath.    Recently was treated for COVID and had some issues with skin peeling due to antibiotic and she was concerned about liver function       Observations/Objective: BP (!) 154/95 (Patient Position: Sitting, Cuff Size: Normal)   Pulse 71   Wt 215 lb (97.5 kg)   BMI 39.32 kg/m  BP Readings from Last 3 Encounters:  07/29/19 (!) 154/95  01/29/19 (!) 144/80  01/22/19 131/70   Exam: Normal Speech.  NAD  Lab and Radiology Results No results found for this or any previous visit (from the past 72 hour(s)). No results found.   Wt Readings from Last 3 Encounters:  07/29/19 215 lb (97.5 kg)  01/29/19 220 lb 4.8 oz (99.9 kg)  01/22/19 220 lb (99.8 kg)     Assessment and Plan: 48 y.o. female with The primary encounter diagnosis was Essential hypertension. Diagnoses of Hypothyroidism, unspecified type, Prediabetes, and Hypertension  goal BP (blood pressure) < 130/80 were also pertinent to this visit.  Will change losartan 50 mg to losartan-chlorothiazide 50-12 0.5 mg.   PDMP not reviewed this encounter. Orders Placed This Encounter  Procedures  . COMPLETE METABOLIC PANEL WITH GFR  . Lipid panel  . TSH   Meds ordered this encounter  Medications  . levothyroxine (SYNTHROID) 112 MCG tablet    Sig: Take 1 tablet (112 mcg total) by mouth daily before breakfast. Must schedule appt before further refills    Dispense:  90 tablet    Refill:  3  . metFORMIN (GLUCOPHAGE XR) 500 MG 24 hr tablet    Sig: Take 1 tablet (500 mg total) by mouth daily.    Dispense:  90 tablet    Refill:  3  . losartan-hydrochlorothiazide (HYZAAR) 50-12.5 MG tablet    Sig: Take 1 tablet by mouth daily.    Dispense:  90 tablet    Refill:  3     Follow Up Instructions: Return in about 6 weeks (around 09/09/2019) for LAB VISIT ONLY - RECHECK PENDING RESULTS / IF WORSE OR CHANGE. MESSAGE ME W/ BP 2 WEEKS. .    I discussed the assessment and treatment plan with the patient. The patient was provided an opportunity to ask questions and all were answered. The patient agreed with the plan and demonstrated an understanding of the instructions.   The patient was advised to  call back or seek an in-person evaluation if any new concerns, if symptoms worsen or if the condition fails to improve as anticipated.  25 minutes of non-face-to-face time was provided during this encounter.                      Historical information moved to improve visibility of documentation.  Past Medical History:  Diagnosis Date  . Anemia   . Thyroid disease    No past surgical history on file. Social History   Tobacco Use  . Smoking status: Never Smoker  . Smokeless tobacco: Never Used  Substance Use Topics  . Alcohol use: No   family history includes Diabetes in her brother and mother.  Medications: Current Outpatient Medications   Medication Sig Dispense Refill  . albuterol (PROVENTIL HFA;VENTOLIN HFA) 108 (90 Base) MCG/ACT inhaler Inhale 2 puffs into the lungs every 4 (four) hours as needed for wheezing or shortness of breath. 2 Inhaler 1  . EPINEPHrine 0.3 mg/0.3 mL IJ SOAJ injection Inject into the skin.    . fluticasone (FLONASE) 50 MCG/ACT nasal spray Place 2 sprays into both nostrils daily. 48 g 3  . ipratropium (ATROVENT) 0.06 % nasal spray Place 2 sprays into both nostrils 4 (four) times daily. As needed for sinus congestion 15 mL 1  . levothyroxine (SYNTHROID) 112 MCG tablet Take 1 tablet (112 mcg total) by mouth daily before breakfast. Must schedule appt before further refills 90 tablet 3  . losartan (COZAAR) 50 MG tablet Take 1 tablet (50 mg total) by mouth daily. 90 tablet 1  . meloxicam (MOBIC) 15 MG tablet Take 1 tablet (15 mg total) by mouth daily as needed for pain. 30 tablet 3  . losartan-hydrochlorothiazide (HYZAAR) 50-12.5 MG tablet Take 1 tablet by mouth daily. 90 tablet 3  . metFORMIN (GLUCOPHAGE XR) 500 MG 24 hr tablet Take 1 tablet (500 mg total) by mouth daily. 90 tablet 3   No current facility-administered medications for this visit.    No Known Allergies  PDMP not reviewed this encounter. Orders Placed This Encounter  Procedures  . COMPLETE METABOLIC PANEL WITH GFR  . Lipid panel  . TSH   Meds ordered this encounter  Medications  . levothyroxine (SYNTHROID) 112 MCG tablet    Sig: Take 1 tablet (112 mcg total) by mouth daily before breakfast. Must schedule appt before further refills    Dispense:  90 tablet    Refill:  3  . metFORMIN (GLUCOPHAGE XR) 500 MG 24 hr tablet    Sig: Take 1 tablet (500 mg total) by mouth daily.    Dispense:  90 tablet    Refill:  3  . losartan-hydrochlorothiazide (HYZAAR) 50-12.5 MG tablet    Sig: Take 1 tablet by mouth daily.    Dispense:  90 tablet    Refill:  3

## 2020-01-21 ENCOUNTER — Telehealth: Payer: Self-pay | Admitting: Osteopathic Medicine

## 2020-01-21 NOTE — Telephone Encounter (Signed)
Patient is past due for a follow up on blood pressure and a PAP as well. Please call patient to schedule.

## 2020-01-23 NOTE — Telephone Encounter (Signed)
Patient has been made aware. No further questions.

## 2020-01-24 DIAGNOSIS — R7303 Prediabetes: Secondary | ICD-10-CM | POA: Diagnosis not present

## 2020-01-24 DIAGNOSIS — E039 Hypothyroidism, unspecified: Secondary | ICD-10-CM | POA: Diagnosis not present

## 2020-01-24 DIAGNOSIS — I10 Essential (primary) hypertension: Secondary | ICD-10-CM | POA: Diagnosis not present

## 2020-01-25 LAB — COMPLETE METABOLIC PANEL WITH GFR
AG Ratio: 1.6 (calc) (ref 1.0–2.5)
ALT: 75 U/L — ABNORMAL HIGH (ref 6–29)
AST: 50 U/L — ABNORMAL HIGH (ref 10–35)
Albumin: 4.1 g/dL (ref 3.6–5.1)
Alkaline phosphatase (APISO): 81 U/L (ref 31–125)
BUN: 15 mg/dL (ref 7–25)
CO2: 28 mmol/L (ref 20–32)
Calcium: 9.3 mg/dL (ref 8.6–10.2)
Chloride: 103 mmol/L (ref 98–110)
Creat: 0.68 mg/dL (ref 0.50–1.10)
GFR, Est African American: 121 mL/min/{1.73_m2} (ref >=60)
GFR, Est Non African American: 104 mL/min/{1.73_m2} (ref >=60)
Globulin: 2.5 g/dL (calc) (ref 1.9–3.7)
Glucose, Bld: 104 mg/dL — ABNORMAL HIGH (ref 65–99)
Potassium: 4.5 mmol/L (ref 3.5–5.3)
Sodium: 138 mmol/L (ref 135–146)
Total Bilirubin: 0.6 mg/dL (ref 0.2–1.2)
Total Protein: 6.6 g/dL (ref 6.1–8.1)

## 2020-01-25 LAB — LIPID PANEL
Cholesterol: 177 mg/dL (ref <200)
HDL: 41 mg/dL — ABNORMAL LOW (ref >=50)
LDL Cholesterol (Calc): 110 mg/dL (calc) — ABNORMAL HIGH
Non-HDL Cholesterol (Calc): 136 mg/dL (calc) — ABNORMAL HIGH (ref <130)
Total CHOL/HDL Ratio: 4.3 (calc) (ref <5.0)
Triglycerides: 149 mg/dL (ref <150)

## 2020-01-25 LAB — TSH: TSH: 3.96 mIU/L

## 2020-02-04 DIAGNOSIS — N925 Other specified irregular menstruation: Secondary | ICD-10-CM | POA: Diagnosis not present

## 2020-02-04 DIAGNOSIS — Z124 Encounter for screening for malignant neoplasm of cervix: Secondary | ICD-10-CM | POA: Diagnosis not present

## 2020-02-04 DIAGNOSIS — Z8742 Personal history of other diseases of the female genital tract: Secondary | ICD-10-CM | POA: Diagnosis not present

## 2020-02-04 DIAGNOSIS — Z01419 Encounter for gynecological examination (general) (routine) without abnormal findings: Secondary | ICD-10-CM | POA: Diagnosis not present

## 2020-02-04 DIAGNOSIS — Z1151 Encounter for screening for human papillomavirus (HPV): Secondary | ICD-10-CM | POA: Diagnosis not present

## 2020-02-04 LAB — HM PAP SMEAR: HM Pap smear: NORMAL

## 2020-02-17 ENCOUNTER — Encounter: Payer: Self-pay | Admitting: Osteopathic Medicine

## 2020-02-17 ENCOUNTER — Other Ambulatory Visit: Payer: Self-pay

## 2020-02-17 ENCOUNTER — Ambulatory Visit (INDEPENDENT_AMBULATORY_CARE_PROVIDER_SITE_OTHER): Payer: BC Managed Care – PPO

## 2020-02-17 ENCOUNTER — Ambulatory Visit (INDEPENDENT_AMBULATORY_CARE_PROVIDER_SITE_OTHER): Payer: BC Managed Care – PPO | Admitting: Osteopathic Medicine

## 2020-02-17 VITALS — BP 138/82 | HR 75 | Temp 98.3°F | Ht 62.0 in | Wt 223.0 lb

## 2020-02-17 DIAGNOSIS — M79672 Pain in left foot: Secondary | ICD-10-CM | POA: Diagnosis not present

## 2020-02-17 DIAGNOSIS — M546 Pain in thoracic spine: Secondary | ICD-10-CM | POA: Diagnosis not present

## 2020-02-17 DIAGNOSIS — M5442 Lumbago with sciatica, left side: Secondary | ICD-10-CM

## 2020-02-17 MED ORDER — MELOXICAM 15 MG PO TABS: 15.0000 mg | ORAL_TABLET | Freq: Every day | ORAL | 3 refills | Status: DC | PRN

## 2020-02-17 NOTE — Patient Instructions (Signed)
Difficult to say for sure, but I think this may be a Morton's Neuroma or an inflammation of the Plantar Fascia (see attached rehab exercises)

## 2020-02-17 NOTE — Progress Notes (Signed)
Sarah Benjamin is a 49 y.o. female who presents to  [place] at [facility]  today, 02/17/20, seeking care for the following: . Foot pain      ASSESSMENT & PLAN with other pertinent history/findings:  The primary encounter diagnosis was Foot pain, left. Diagnoses of Acute left-sided thoracic back pain and Acute left-sided low back pain with left-sided sciatica were also pertinent to this visit.  L foot 7-8 / 10, sometimes a 10 Bottom of foot near MTP between 3r/4th toes, no madd, (+)tendnerness, normal ROM      BP Readings from Last 3 Encounters:  02/17/20 138/82  07/29/19 (!) 154/95  01/29/19 (!) 144/80     Patient Instructions  Difficult to say for sure, but I think this may be a Morton's Neuroma or an inflammation of the Plantar Fascia (see attached rehab exercises)     Orders Placed This Encounter  Procedures  . DG Foot Complete Left    Meds ordered this encounter  Medications  . meloxicam (MOBIC) 15 MG tablet    Sig: Take 1 tablet (15 mg total) by mouth daily as needed for pain. Take daily for one week then as needed after that    Dispense:  30 tablet    Refill:  3    XR personally reviewed look ok  No results found.   Follow-up instructions: Return for VISIT WITH SPORTS MEDICINE FOR ORTHOPEDIC ISSUE IF NO BETTER IN 2 WEKS, OR IF WORSE/CHANGE.                                         BP 138/82   Pulse 75   Temp 98.3 F (36.8 C) (Oral)   Ht 5\' 2"  (1.575 m)   Wt 223 lb (101.2 kg)   LMP 01/27/2020   BMI 40.79 kg/m   Current Meds  Medication Sig  . albuterol (PROVENTIL HFA;VENTOLIN HFA) 108 (90 Base) MCG/ACT inhaler Inhale 2 puffs into the lungs every 4 (four) hours as needed for wheezing or shortness of breath.  . EPINEPHrine 0.3 mg/0.3 mL IJ SOAJ injection Inject into the skin.  . fluticasone (FLONASE) 50 MCG/ACT nasal spray Place 2 sprays into both nostrils daily.   Marland Kitchen levothyroxine (SYNTHROID) 112 MCG tablet Take 1 tablet (112 mcg total) by mouth daily before breakfast. Must schedule appt before further refills  . losartan (COZAAR) 50 MG tablet Take 1 tablet (50 mg total) by mouth daily.  Marland Kitchen losartan-hydrochlorothiazide (HYZAAR) 50-12.5 MG tablet Take 1 tablet by mouth daily.  . meloxicam (MOBIC) 15 MG tablet Take 1 tablet (15 mg total) by mouth daily as needed for pain. Take daily for one week then as needed after that  . metFORMIN (GLUCOPHAGE XR) 500 MG 24 hr tablet Take 1 tablet (500 mg total) by mouth daily.  . [DISCONTINUED] meloxicam (MOBIC) 15 MG tablet Take 1 tablet (15 mg total) by mouth daily as needed for pain.    No results found for this or any previous visit (from the past 72 hour(s)).  No results found.  Depression screen [facility] 2/9 07/29/2019 01/22/2019 10/18/2018  Decreased Interest 0 0 0  Down, Depressed, Hopeless 0 1 0  PHQ - 2 Score 0 1 0  Altered sleeping - 2 2  Tired, decreased energy - 2 1  Change in appetite - 2 0  Feeling bad or failure about yourself  -  0 0  Trouble concentrating - 0 0  Moving slowly or fidgety/restless - 0 0  Suicidal thoughts - 0 0  PHQ-9 Score - 7 3  Difficult doing work/chores - Somewhat difficult Not difficult at all    GAD 7 : Generalized Anxiety Score 07/29/2019 01/22/2019 10/18/2018 07/26/2018  Nervous, Anxious, on Edge 1 1 0 0  Control/stop worrying 0 0 0 0  Worry too much - different things 0 2 0 1  Trouble relaxing 1 2 1 1   Restless 0 1 0 2  Easily annoyed or irritable 0 0 0 0  Afraid - awful might happen 0 0 0 0  Total GAD 7 Score 2 6 1 4   Anxiety Difficulty Not difficult at all Not difficult at all Not difficult at all Somewhat difficult      All questions at time of visit were answered - patient instructed to contact office with any additional concerns or updates.  ER/RTC precautions were reviewed with the patient.  Please note: voice recognition software was used to produce this  document, and typos may escape review. Please contact Dr. Sheppard Coil for any needed clarifications.

## 2020-05-06 DIAGNOSIS — M79672 Pain in left foot: Secondary | ICD-10-CM | POA: Diagnosis not present

## 2020-05-06 DIAGNOSIS — M7752 Other enthesopathy of left foot: Secondary | ICD-10-CM | POA: Diagnosis not present

## 2020-05-06 DIAGNOSIS — M84375A Stress fracture, left foot, initial encounter for fracture: Secondary | ICD-10-CM | POA: Diagnosis not present

## 2020-05-06 DIAGNOSIS — M7742 Metatarsalgia, left foot: Secondary | ICD-10-CM | POA: Diagnosis not present

## 2020-06-11 DIAGNOSIS — M7742 Metatarsalgia, left foot: Secondary | ICD-10-CM | POA: Diagnosis not present

## 2020-06-11 DIAGNOSIS — M84375D Stress fracture, left foot, subsequent encounter for fracture with routine healing: Secondary | ICD-10-CM | POA: Diagnosis not present

## 2020-06-11 DIAGNOSIS — M7752 Other enthesopathy of left foot: Secondary | ICD-10-CM | POA: Diagnosis not present

## 2020-06-11 DIAGNOSIS — M79672 Pain in left foot: Secondary | ICD-10-CM | POA: Diagnosis not present

## 2020-07-08 ENCOUNTER — Other Ambulatory Visit: Payer: Self-pay

## 2020-07-08 ENCOUNTER — Ambulatory Visit (INDEPENDENT_AMBULATORY_CARE_PROVIDER_SITE_OTHER): Payer: BC Managed Care – PPO

## 2020-07-08 ENCOUNTER — Encounter: Payer: Self-pay | Admitting: Sports Medicine

## 2020-07-08 ENCOUNTER — Ambulatory Visit (INDEPENDENT_AMBULATORY_CARE_PROVIDER_SITE_OTHER): Payer: BC Managed Care – PPO | Admitting: Sports Medicine

## 2020-07-08 DIAGNOSIS — M79672 Pain in left foot: Secondary | ICD-10-CM

## 2020-07-08 DIAGNOSIS — M545 Low back pain: Secondary | ICD-10-CM | POA: Diagnosis not present

## 2020-07-08 DIAGNOSIS — M84375A Stress fracture, left foot, initial encounter for fracture: Secondary | ICD-10-CM | POA: Insufficient documentation

## 2020-07-08 MED ORDER — GABAPENTIN 300 MG PO CAPS: ORAL_CAPSULE | ORAL | 3 refills | Status: DC

## 2020-07-08 MED ORDER — MELOXICAM 15 MG PO TABS: ORAL_TABLET | ORAL | 3 refills | Status: DC

## 2020-07-08 NOTE — Progress Notes (Signed)
    Procedures performed today:    None.  Independent interpretation of notes and tests performed by another provider:   None.  Brief History, Exam, Impression, and Recommendations:    Left foot pain This is a 49 year old female, she has had several months of pain over her left foot, initially at her MTPs but now she localizes it over her second through fourth metatarsals over the shafts. In addition she has some cramping in her anterolateral thigh and lower leg, she has endorsed some numbness and tingling as well. She was seeing a podiatrist, it sounds as though she was in some sort of dorsiflexion cast. Still has some pain. We are going to go ahead and proceed with an MRI as she has had greater than 6 weeks of physician directed conservative measures, as well as x-rays. We also need to keep a radicular source of her pain in the back of her head, for this reason we are going to add meloxicam and gabapentin at night. Return to go over MRI results.    ___________________________________________ Gwen Her. Dianah Field, M.D., ABFM., CAQSM. Primary Care and [place] Instructor of [employer] [facility] of Medicine

## 2020-07-08 NOTE — Assessment & Plan Note (Signed)
This is a 49 year old female, she has had several months of pain over her left foot, initially at her MTPs but now she localizes it over her second through fourth metatarsals over the shafts. In addition she has some cramping in her anterolateral thigh and lower leg, she has endorsed some numbness and tingling as well. She was seeing a podiatrist, it sounds as though she was in some sort of dorsiflexion cast. Still has some pain. We are going to go ahead and proceed with an MRI as she has had greater than 6 weeks of physician directed conservative measures, as well as x-rays. We also need to keep a radicular source of her pain in the back of her head, for this reason we are going to add meloxicam and gabapentin at night. Return to go over MRI results.

## 2020-07-12 ENCOUNTER — Other Ambulatory Visit: Payer: Self-pay

## 2020-07-12 ENCOUNTER — Ambulatory Visit (INDEPENDENT_AMBULATORY_CARE_PROVIDER_SITE_OTHER): Payer: BC Managed Care – PPO

## 2020-07-12 DIAGNOSIS — M79672 Pain in left foot: Secondary | ICD-10-CM

## 2020-07-12 DIAGNOSIS — R600 Localized edema: Secondary | ICD-10-CM | POA: Diagnosis not present

## 2020-07-13 ENCOUNTER — Ambulatory Visit (INDEPENDENT_AMBULATORY_CARE_PROVIDER_SITE_OTHER): Payer: BC Managed Care – PPO | Admitting: Osteopathic Medicine

## 2020-07-13 ENCOUNTER — Encounter: Payer: Self-pay | Admitting: Osteopathic Medicine

## 2020-07-13 VITALS — BP 114/75 | HR 65 | Temp 98.0°F | Ht 62.0 in | Wt 224.0 lb

## 2020-07-13 DIAGNOSIS — E039 Hypothyroidism, unspecified: Secondary | ICD-10-CM | POA: Diagnosis not present

## 2020-07-13 DIAGNOSIS — Z1211 Encounter for screening for malignant neoplasm of colon: Secondary | ICD-10-CM

## 2020-07-13 DIAGNOSIS — R7303 Prediabetes: Secondary | ICD-10-CM

## 2020-07-13 DIAGNOSIS — I10 Essential (primary) hypertension: Secondary | ICD-10-CM | POA: Diagnosis not present

## 2020-07-13 DIAGNOSIS — Z0000 Encounter for general adult medical examination without abnormal findings: Secondary | ICD-10-CM | POA: Diagnosis not present

## 2020-07-13 DIAGNOSIS — J309 Allergic rhinitis, unspecified: Secondary | ICD-10-CM

## 2020-07-13 MED ORDER — FLUTICASONE PROPIONATE 50 MCG/ACT NA SUSP: 2.0000 | Freq: Every day | NASAL | 3 refills | Status: DC

## 2020-07-13 MED ORDER — LEVOTHYROXINE SODIUM 112 MCG PO TABS
112.0000 ug | ORAL_TABLET | Freq: Every day | ORAL | 3 refills | Status: DC
Start: 2020-07-13 — End: 2020-07-14

## 2020-07-13 MED ORDER — LOSARTAN POTASSIUM-HCTZ 50-12.5 MG PO TABS
1.0000 | ORAL_TABLET | Freq: Every day | ORAL | 3 refills | Status: DC
Start: 2020-07-13 — End: 2021-07-27

## 2020-07-13 MED ORDER — METFORMIN HCL ER 500 MG PO TB24
500.0000 mg | ORAL_TABLET | Freq: Every day | ORAL | 3 refills | Status: DC
Start: 2020-07-13 — End: 2020-08-06

## 2020-07-13 NOTE — Progress Notes (Signed)
Sarah Benjamin is a 49 y.o. female who presents to  [place] at [facility]  today, 07/13/20, seeking care for the following:  . Annual physical      ASSESSMENT & PLAN with other pertinent findings:  The primary encounter diagnosis was Annual physical exam. Diagnoses of Prediabetes, Hypothyroidism, unspecified type, Essential hypertension, Allergic rhinitis, unspecified seasonality, unspecified trigger, and Colon cancer screening were also pertinent to this visit.   No results found for this or any previous visit (from the past 24 hour(s)).   Patient Instructions  General Preventive Care  Most recent routine screening labs: orders in .   Blood pressure goal 130/80 or less.   Tobacco: don't! Alcohol: responsible moderation is ok for most adults - if you have concerns about your alcohol intake, please talk to me!   Exercise: as tolerated to reduce risk of cardiovascular disease and diabetes. Strength training will also prevent osteoporosis.   Mental health: if need for mental health care (medicines, counseling, other), or concerns about moods, please let me know!   Sexual / Reproductive health: if need for STD testing, or if concerns with libido/pain problems, please let me know!  Advanced Directive: Living Will and/or Healthcare Power of Attorney recommended for all adults, regardless of age or health.  Vaccines  Flu vaccine: every fall.   Shingles vaccine: after age 55.   Pneumonia vaccines: after age 11, or sooner if certain medical conditions.  Tetanus booster: every 10 years - due 07/2028  COVID vaccine: THANKS for getting your vaccine! :)  Cancer screenings   Colon cancer screening: for everyone age 64-75. Colonoscopy available for all, many people also qualify for the Cologuard stool test   Breast cancer screening: mammogram at age 86 every other year at least, and annually after age 16.   Cervical cancer screening:  Pap per OBGYN.   Lung cancer screening: not needed for non-smokers  Infection screenings  . HIV: recommended screening at least once age 78-65, more often as needed. . Gonorrhea/Chlamydia: screening as needed . Hepatitis C: recommended once for everyone age 66-75 . TB: certain at-risk populations, or depending on work requirements and/or travel history Other  Bone Density Test: recommended for women at age 5     Orders Placed This Encounter  Procedures  . CBC  . COMPLETE METABOLIC PANEL WITH GFR  . Lipid panel  . Hemoglobin A1c  . TSH  . Ambulatory referral to Gastroenterology    Meds ordered this encounter  Medications  . metFORMIN (GLUCOPHAGE XR) 500 MG 24 hr tablet    Sig: Take 1 tablet (500 mg total) by mouth daily.    Dispense:  90 tablet    Refill:  3  . losartan-hydrochlorothiazide (HYZAAR) 50-12.5 MG tablet    Sig: Take 1 tablet by mouth daily.    Dispense:  90 tablet    Refill:  3  . levothyroxine (SYNTHROID) 112 MCG tablet    Sig: Take 1 tablet (112 mcg total) by mouth daily before breakfast.    Dispense:  90 tablet    Refill:  3  . fluticasone (FLONASE) 50 MCG/ACT nasal spray    Sig: Place 2 sprays into both nostrils daily.    Dispense:  48 g    Refill:  3    Constitutional:  . VSS, see nurse notes . General Appearance: alert, well-developed, well-nourished, NAD Eyes/Ears: . Normal lids and conjunctive, non-icteric sclera . PERRLA . Normal external auditory canal and TM  bilaterally Neck: . No masses, trachea midline . No thyroid enlargement/tenderness/mass appreciated Respiratory: . Normal respiratory effort . Breath sounds normal, no wheeze/rhonchi/rales Cardiovascular: . S1/S2 normal, no murmur/rub/gallop auscultated . No lower extremity edema Gastrointestinal: . Nontender, no masses . No hepatomegaly, no splenomegaly . No hernia appreciated Musculoskeletal:  . Gait normal . No clubbing/cyanosis of digits Neurological: . No cranial  nerve deficit on limited exam . Motor and sensation intact and symmetric Psychiatric: . Normal judgment/insight . Normal mood and affect    Follow-up instructions: Return in about 6 months (around 01/11/2021) for MONITOR A1C - PREDIABETES, MONITOR BP. SEE [place] SOONER IF NEEDED. .                                         BP 114/75 (BP Location: Left Arm, Patient Position: Sitting)   Pulse 65   Temp 98 F (36.7 C)   Ht 5\' 2"  (1.575 m)   Wt 224 lb (101.6 kg)   SpO2 97%   BMI 40.97 kg/m   Current Meds  Medication Sig  . albuterol (PROVENTIL HFA;VENTOLIN HFA) 108 (90 Base) MCG/ACT inhaler Inhale 2 puffs into the lungs every 4 (four) hours as needed for wheezing or shortness of breath.  . EPINEPHrine 0.3 mg/0.3 mL IJ SOAJ injection Inject into the skin.  . fluticasone (FLONASE) 50 MCG/ACT nasal spray Place 2 sprays into both nostrils daily.  Marland Kitchen gabapentin (NEURONTIN) 300 MG capsule One tab PO qHS for a week, then BID for a week, then TID. May double weekly to a max of 3,600mg /day  . levothyroxine (SYNTHROID) 112 MCG tablet Take 1 tablet (112 mcg total) by mouth daily before breakfast.  . losartan-hydrochlorothiazide (HYZAAR) 50-12.5 MG tablet Take 1 tablet by mouth daily.  . meloxicam (MOBIC) 15 MG tablet One tab PO qAM with a meal for 2 weeks, then daily prn pain.  . metFORMIN (GLUCOPHAGE XR) 500 MG 24 hr tablet Take 1 tablet (500 mg total) by mouth daily.  . [DISCONTINUED] fluticasone (FLONASE) 50 MCG/ACT nasal spray Place 2 sprays into both nostrils daily.  . [DISCONTINUED] levothyroxine (SYNTHROID) 112 MCG tablet Take 1 tablet (112 mcg total) by mouth daily before breakfast. Must schedule appt before further refills  . [DISCONTINUED] losartan-hydrochlorothiazide (HYZAAR) 50-12.5 MG tablet Take 1 tablet by mouth daily.  . [DISCONTINUED] metFORMIN (GLUCOPHAGE XR) 500 MG 24 hr tablet Take 1 tablet (500 mg total) by mouth daily.    No results found for  this or any previous visit (from the past 72 hour(s)).  No results found.     All questions at time of visit were answered - patient instructed to contact office with any additional concerns or updates.  ER/RTC precautions were reviewed with the patient as applicable.   Please note: voice recognition software was used to produce this document, and typos may escape review. Please contact Dr. Sheppard Coil for any needed clarifications.

## 2020-07-13 NOTE — Patient Instructions (Addendum)
General Preventive Care  Most recent routine screening labs: orders in .   Blood pressure goal 130/80 or less.   Tobacco: don't! Alcohol: responsible moderation is ok for most adults - if you have concerns about your alcohol intake, please talk to me!   Exercise: as tolerated to reduce risk of cardiovascular disease and diabetes. Strength training will also prevent osteoporosis.   Mental health: if need for mental health care (medicines, counseling, other), or concerns about moods, please let me know!   Sexual / Reproductive health: if need for STD testing, or if concerns with libido/pain problems, please let me know!  Advanced Directive: Living Will and/or Healthcare Power of Attorney recommended for all adults, regardless of age or health.  Vaccines  Flu vaccine: every fall.   Shingles vaccine: after age 15.   Pneumonia vaccines: after age 104, or sooner if certain medical conditions.  Tetanus booster: every 10 years - due 07/2028  COVID vaccine: THANKS for getting your vaccine! :)  Cancer screenings   Colon cancer screening: for everyone age 65-75. Colonoscopy available for all, many people also qualify for the Cologuard stool test   Breast cancer screening: mammogram at age 45 every other year at least, and annually after age 88.   Cervical cancer screening: Pap per OBGYN.   Lung cancer screening: not needed for non-smokers  Infection screenings  . HIV: recommended screening at least once age 33-65, more often as needed. . Gonorrhea/Chlamydia: screening as needed . Hepatitis C: recommended once for everyone age 24-75 . TB: certain at-risk populations, or depending on work requirements and/or travel history Other  Bone Density Test: recommended for women at age 14

## 2020-07-14 ENCOUNTER — Encounter: Payer: Self-pay | Admitting: Osteopathic Medicine

## 2020-07-14 DIAGNOSIS — E039 Hypothyroidism, unspecified: Secondary | ICD-10-CM

## 2020-07-14 LAB — CBC
HCT: 37.6 % (ref 35.0–45.0)
Hemoglobin: 12.2 g/dL (ref 11.7–15.5)
MCH: 28.2 pg (ref 27.0–33.0)
MCHC: 32.4 g/dL (ref 32.0–36.0)
MCV: 87 fL (ref 80.0–100.0)
MPV: 13.5 fL — ABNORMAL HIGH (ref 7.5–12.5)
Platelets: 241 10*3/uL (ref 140–400)
RBC: 4.32 10*6/uL (ref 3.80–5.10)
RDW: 14.6 % (ref 11.0–15.0)
WBC: 7 10*3/uL (ref 3.8–10.8)

## 2020-07-14 LAB — COMPLETE METABOLIC PANEL WITH GFR
AG Ratio: 1.4 (calc) (ref 1.0–2.5)
ALT: 59 U/L — ABNORMAL HIGH (ref 6–29)
AST: 41 U/L — ABNORMAL HIGH (ref 10–35)
Albumin: 3.9 g/dL (ref 3.6–5.1)
Alkaline phosphatase (APISO): 87 U/L (ref 31–125)
BUN: 21 mg/dL (ref 7–25)
CO2: 30 mmol/L (ref 20–32)
Calcium: 9.1 mg/dL (ref 8.6–10.2)
Chloride: 103 mmol/L (ref 98–110)
Creat: 0.61 mg/dL (ref 0.50–1.10)
GFR, Est African American: 125 mL/min/{1.73_m2} (ref >=60)
GFR, Est Non African American: 108 mL/min/{1.73_m2} (ref >=60)
Globulin: 2.7 g/dL (calc) (ref 1.9–3.7)
Glucose, Bld: 109 mg/dL — ABNORMAL HIGH (ref 65–99)
Potassium: 4.5 mmol/L (ref 3.5–5.3)
Sodium: 138 mmol/L (ref 135–146)
Total Bilirubin: 0.4 mg/dL (ref 0.2–1.2)
Total Protein: 6.6 g/dL (ref 6.1–8.1)

## 2020-07-14 LAB — LIPID PANEL
Cholesterol: 176 mg/dL (ref <200)
HDL: 48 mg/dL — ABNORMAL LOW (ref >=50)
LDL Cholesterol (Calc): 106 mg/dL (calc) — ABNORMAL HIGH
Non-HDL Cholesterol (Calc): 128 mg/dL (calc) (ref <130)
Total CHOL/HDL Ratio: 3.7 (calc) (ref <5.0)
Triglycerides: 122 mg/dL (ref <150)

## 2020-07-14 LAB — HEMOGLOBIN A1C
Hgb A1c MFr Bld: 6.5 % of total Hgb — ABNORMAL HIGH (ref <5.7)
Mean Plasma Glucose: 140 (calc)
eAG (mmol/L): 7.7 (calc)

## 2020-07-14 LAB — TSH: TSH: 7.1 mIU/L — ABNORMAL HIGH

## 2020-07-14 MED ORDER — LEVOTHYROXINE SODIUM 125 MCG PO TABS
125.0000 ug | ORAL_TABLET | Freq: Every day | ORAL | 0 refills | Status: DC
Start: 2020-07-14 — End: 2020-10-26

## 2020-07-21 ENCOUNTER — Other Ambulatory Visit: Payer: Self-pay

## 2020-07-21 ENCOUNTER — Ambulatory Visit (INDEPENDENT_AMBULATORY_CARE_PROVIDER_SITE_OTHER): Payer: BC Managed Care – PPO | Admitting: Sports Medicine

## 2020-07-21 DIAGNOSIS — M79672 Pain in left foot: Secondary | ICD-10-CM | POA: Diagnosis not present

## 2020-07-21 DIAGNOSIS — M47816 Spondylosis without myelopathy or radiculopathy, lumbar region: Secondary | ICD-10-CM | POA: Diagnosis not present

## 2020-07-21 MED ORDER — CALCIUM CARBONATE-VITAMIN D 600-400 MG-UNIT PO TABS: 1.0000 | ORAL_TABLET | Freq: Two times a day (BID) | ORAL | 11 refills | Status: DC

## 2020-07-21 NOTE — Progress Notes (Signed)
    Procedures performed today:    None.  Independent interpretation of notes and tests performed by another provider:   I personally reviewed the foot MRI which shows T2 edema on the plantar aspect of the MTPs from the second through fourth metatarsals.  Brief History, Exam, Impression, and Recommendations:    Left foot pain This is a very pleasant 49 year old female, she had her MRI which showed T2 edema in the second through fourth metatarsal shaft consistent with stress fracture, she does have a boot on today which was purchased by her daughter. This is excessive immobilization so we will downgrade into a postop shoe today. I would like her to wear this for 6 weeks, I am also going to add a calcium and vitamin D supplement. Return to see me in 6 weeks for this.  Lumbar spondylosis Multilevel lumbar DDD with occasional anterolateral left thigh numbness and tingling. Adding formal PT, return in 6 weeks, if persistent symptoms we will proceed with MRI for interventional planning. Continue meloxicam as well in the meantime.    ___________________________________________ Gwen Her. Dianah Field, M.D., ABFM., CAQSM. Primary Care and [place] Instructor of [place] of [facility] of Medicine

## 2020-07-21 NOTE — Assessment & Plan Note (Signed)
This is a very pleasant 49 year old female, she had her MRI which showed T2 edema in the second through fourth metatarsal shaft consistent with stress fracture, she does have a boot on today which was purchased by her daughter. This is excessive immobilization so we will downgrade into a postop shoe today. I would like her to wear this for 6 weeks, I am also going to add a calcium and vitamin D supplement. Return to see me in 6 weeks for this.

## 2020-07-21 NOTE — Assessment & Plan Note (Signed)
Multilevel lumbar DDD with occasional anterolateral left thigh numbness and tingling. Adding formal PT, return in 6 weeks, if persistent symptoms we will proceed with MRI for interventional planning. Continue meloxicam as well in the meantime.

## 2020-08-06 ENCOUNTER — Encounter: Payer: Self-pay | Admitting: Osteopathic Medicine

## 2020-08-06 ENCOUNTER — Other Ambulatory Visit: Payer: Self-pay

## 2020-08-06 ENCOUNTER — Ambulatory Visit (INDEPENDENT_AMBULATORY_CARE_PROVIDER_SITE_OTHER): Payer: BC Managed Care – PPO | Admitting: Rehabilitative and Restorative Service Providers"

## 2020-08-06 DIAGNOSIS — M546 Pain in thoracic spine: Secondary | ICD-10-CM

## 2020-08-06 DIAGNOSIS — R29898 Other symptoms and signs involving the musculoskeletal system: Secondary | ICD-10-CM

## 2020-08-06 DIAGNOSIS — R7303 Prediabetes: Secondary | ICD-10-CM

## 2020-08-06 DIAGNOSIS — R293 Abnormal posture: Secondary | ICD-10-CM | POA: Diagnosis not present

## 2020-08-06 DIAGNOSIS — M5442 Lumbago with sciatica, left side: Secondary | ICD-10-CM | POA: Diagnosis not present

## 2020-08-06 MED ORDER — METFORMIN HCL ER 500 MG PO TB24: 500.0000 mg | ORAL_TABLET | Freq: Every day | ORAL | 3 refills | Status: DC

## 2020-08-06 NOTE — Therapy (Addendum)
[place], [place], [address] Phone: [phone]   Fax:  [phone]  Physical Therapy Evaluation  Patient Details  Name: Sarah Benjamin MRN: [id number] Date of Birth: 09-Feb-1971 Referring Provider (PT): Dr Dianah Field   Encounter Date: 08/06/2020   PT End of Session - 08/06/20 1259    Visit Number 1    Number of Visits 12    Date for PT Re-Evaluation 09/17/20    PT Start Time 1104    PT Stop Time 1148    PT Time Calculation (min) 44 min    Activity Tolerance Patient tolerated treatment well           Past Medical History:  Diagnosis Date  . Anemia   . Thyroid disease     No past surgical history on file.  There were no vitals filed for this visit.    Subjective Assessment - 08/06/20 1108    Subjective Patient reports gradual onset of mid to low back pain and paiin into the Lt hip and LE for the past 4 months with no known injury. She has a broken Lt toe ~ 6 weeks ago. She was in a walking shoe initially then out out of shoe and back to work. She returned to work and pain in the toe returned. She was placed in walking boot for the past 2 weeks and will be in the boot for ~ 4 more weeks.    Pertinent History history of thoracic pain treated ~ 2 years ago with some improvement with treatment. she continued to have some intermittent pain. hx of HTN; asthma; allergies    Patient Stated Goals help to decrease the back pain    Currently in Pain? Yes    Pain Score 6     Pain Location Back    Pain Orientation Mid;Lower;Left;Right    Pain Descriptors / Indicators Pounding    Pain Type Chronic pain;Acute pain    Pain Radiating Towards into Lt hip and lateral thigh to mid thigh    Pain Onset More than a month ago    Pain Frequency Intermittent    Aggravating Factors  lifting something heavy; lying in the bed for prolonged periods of time    Pain Relieving Factors lying down/sleeping; sitting  down; hot shower              [facility] PT Assessment - 08/06/20 0001      Assessment   Medical Diagnosis Low back pain; Lt LE radicular pain     Referring Provider (PT) Dr Dianah Field    Onset Date/Surgical Date 03/14/20    Hand Dominance Right    Next MD Visit 09/01/20    Prior Therapy here for thoracic pain       Precautions   Precautions None    Precaution Comments in walking boot       Restrictions   Weight Bearing Restrictions No      Balance Screen   Has the patient fallen in the past 6 months No    Has the patient had a decrease in activity level because of a fear of falling?  No    Is the patient reluctant to leave their home because of a fear of falling?  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     Leisure household chores; cooking; sedentary       Observation/Other Assessments   Focus on Therapeutic Outcomes (FOTO)  40% limitation       Sensation   Additional Comments tingling Lt lateral thigh with standing 10-15 min feels like it is getting numb per pt report       Posture/Postural Control   Posture Comments head forward; shoudlers rounded and elevated; flexed forward at hips; wt shifted to Rt with walking boot in place       AROM   Lumbar Flexion 70% pulling     Lumbar Extension 60% discomfort     Lumbar - Right Side Bend 70% pulling Lt LB    Lumbar - Left Side Bend 75% discomfort Lt LB    Lumbar - Right Rotation 45% mild discomfort     Lumbar - Left Rotation 45% mild discomfort       Strength   Overall Strength Comments WFL's functionally bilat LE's       Flexibility   Hamstrings WFL's     Quadriceps tight Lt     ITB tight Lt     Piriformis tight Lt > Rt       Palpation   Spinal  mobility hypomobile lower thoracic and lumbar spine with CP mobs     Palpation comment muscular tightness Lt psoas; bilat lumbar paraspinals; QL; lats; Rt gluts; piriformis       Special Tests   Other special tests (-) SRL; slump                       Objective measurements completed on examination: See above findings.       [facility] Adult PT Treatment/Exercise - 08/06/20 0001      Self-Care   Self-Care --   initated back care education; discussed even up gait assist      Lumbar Exercises: Stretches   Sports administrator Right;Left;2 reps;30 seconds   prone with strap    ITB Stretch Right;Left;2 reps;30 seconds   supine with strap      Lumbar Exercises: Supine   AB Set Limitations initiated three part core exercise 10 sec hold x 10 reps                        PT Long Term Goals - 08/06/20 1251      PT LONG TERM GOAL #1   Title Improve posture and alignment with patient to demonstrate improved upright posture    Time 6    Period Weeks    Status New    Target Date 09/17/20      PT LONG TERM GOAL #2   Title Decrease pain by 50-75% allowing patient to participate in normal ADL's and work tasks without increase in pain    Time 6    Period Weeks    Status New    Target Date 09/17/20      PT LONG TERM GOAL #3   Title Patient to demonstrate and/or verbalize proper body mechanics with transitional movements and positions for spine care    Time 6    Period Weeks    Status New    Target Date 09/17/20      PT LONG TERM GOAL #4   Title Independent in HEP    Time 6  Period Weeks    Status New    Target Date 09/17/20      PT LONG TERM GOAL #5   Title Improve FOTO to </= 31% limitation    Time 6    Period Weeks    Status New    Target Date 09/17/20                  Plan - 08/06/20 1144    Clinical Impression Statement Patient presents with c/o mid to low back pain which has been present for the past 4 months with no known injury. Patient has  has intermittent thoracic and lumbar pain for at least the past 2-3 years. She also has recently fractured her Lt toe and is in a walking boot. Patient has has an abnormal gait pattern from the fx of toes for the past 6 weeks, irritating her LBP and Lt LE pain. Patient has poor posture and alignment; poor core strength; limited and painful trunk and LE mobility and ROM; muscular tightness to palpation through Lt psoas; bilat lumbar QL/lats; Lt > Rt gluts/piriformis. Patient will benefit from PT to address problems identified.    Stability/Clinical Decision Making Stable/Uncomplicated    Clinical Decision Making Low    Rehab Potential Good    PT Frequency 2x / week    PT Duration 6 weeks    PT Treatment/Interventions ADLs/Self Care Home Management;Aquatic Therapy;Cryotherapy;Electrical Stimulation;Iontophoresis 43m/ml Dexamethasone;Moist Heat;Traction;Ultrasound;Stair training;Gait training;Functional mobility training;Therapeutic activities;Therapeutic exercise;Balance training;Neuromuscular re-education;Patient/family education;Manual techniques;Dry needling;Taping    PT Next Visit Plan review HEP; trial of DN/manual work bilat LB and Lt posterior/anterior hip; core stabilization; modalities as indicated    PT Home Exercise Plan [email or web address]    Consulted and Agree with Plan of Care Patient           Patient will benefit from skilled therapeutic intervention in order to improve the following deficits and impairments:  Abnormal gait, Increased fascial restricitons, Increased muscle spasms, Pain, Hypomobility, Impaired flexibility, Improper body mechanics, Decreased mobility, Decreased strength, Impaired sensation, Postural dysfunction  Visit Diagnosis: Acute left-sided low back pain with left-sided sciatica - Plan: PT plan of care cert/re-cert  Pain in thoracic spine - Plan: PT plan of care cert/re-cert  Other symptoms and signs involving the musculoskeletal system - Plan: PT plan of care  cert/re-cert  Abnormal posture - Plan: PT plan of care cert/re-cert     Problem List Patient Active Problem List   Diagnosis Date Noted  . Left foot pain 07/08/2020  . Arthralgia of metacarpophalangeal joint, right third 09/18/2018  . Allergic rhinitis 06/12/2018  . Transaminitis 06/12/2018  . BPPV (benign paroxysmal positional vertigo), right 06/04/2018  . Essential hypertension 02/12/2018  . Numbness and tingling of left side of face 02/12/2018  . Prediabetes 06/21/2017  . Irregular menses 03/30/2017  . Dizziness 04/19/2016  . Seasonal allergies 04/19/2016  . Sinus headache 04/19/2016  . History of Bell's palsy 04/19/2016  . PFO (patent foramen ovale) 01/12/2016  . Pain in the chest 01/12/2016  . Lumbar spondylosis 04/17/2015  . Hip pain 04/17/2015  . Mild persistent asthma without complication [id number] . Hypothyroid 01/13/2014  . Constipation 01/13/2014  . Migraine 01/13/2014    Celyn PNilda SimmerPT, MPH  08/06/2020, 12:59 PM  [facility] [place] [address]Phone: [phone]  Fax:  [phone] Name: AHANNELORE BOVAMRN: [id number] of Birth: 110/18/1972 PHYSICAL THERAPY DISCHARGE SUMMARY  Visits from [facility]  Care: Evaluation only  Current functional level related to goals / functional outcomes: See evaluation - patient seen for one visit only    Remaining deficits: Unknown    Education / Equipment: Initial HEP  Plan: Patient agrees to discharge.  Patient goals were not met. Patient is being discharged due to not returning since the last visit.  ?????     Timaya Bojarski P. Helene Kelp PT, MPH 01/04/21 12:39 PM

## 2020-08-06 NOTE — Patient Instructions (Addendum)
Trigger Point Dry Needling  . What is Trigger Point Dry Needling (DN)? o DN is a physical therapy technique used to treat muscle pain and dysfunction. Specifically, DN helps deactivate muscle trigger points (muscle knots).  o A thin filiform needle is used to penetrate the skin and stimulate the underlying trigger point. The goal is for a local twitch response (LTR) to occur and for the trigger point to relax. No medication of any kind is injected during the procedure.   Access Code: [email or web address]: [email or web address]: 09/23/2021Prepared by: Lupita Rosales HoltExercises  Supine Transversus Abdominis Bracing with Pelvic Floor Contraction - 2 x daily - 7 x weekly - 1 sets - 10 reps - 10sec hold  Prone Quadriceps Stretch with Strap - 2 x daily - 7 x weekly - 1 sets - 3 reps - 30 sec hold  Supine ITB Stretch with Strap - 2 x daily - 7 x weekly - 1 sets - 3 reps - 30 sec hold Patient Education  Posture and Body Mechanics  TENS Unit   . What Does Trigger Point Dry Needling Feel Like?  o The procedure feels different for each individual patient. Some patients report that they do not actually feel the needle enter the skin and overall the process is not painful. Very mild bleeding may occur. However, many patients feel a deep cramping in the muscle in which the needle was inserted. This is the local twitch response.   Marland Kitchen How Will I feel after the treatment? o Soreness is normal, and the onset of soreness may not occur for a few hours. Typically this soreness does not last longer than two days.  o Bruising is uncommon, however; ice can be used to decrease any possible bruising.  o In rare cases feeling tired or nauseous after the treatment is normal. In addition, your symptoms may get worse before they get better, this period will typically not last longer than 24 hours.   . What Can I do After My Treatment? o Increase your hydration by drinking more water for the next 24 hours. o You may  place ice or heat on the areas treated that have become sore, however, do not use heat on inflamed or bruised areas. Heat often brings more relief post needling. o You can continue your regular activities, but vigorous activity is not recommended initially after the treatment for 24 hours. o DN is best combined with other physical therapy such as strengthening, stretching, and other therapies.

## 2020-08-19 ENCOUNTER — Encounter: Payer: Self-pay | Admitting: Osteopathic Medicine

## 2020-08-19 DIAGNOSIS — Z1231 Encounter for screening mammogram for malignant neoplasm of breast: Secondary | ICD-10-CM | POA: Diagnosis not present

## 2020-08-19 LAB — HM MAMMOGRAPHY

## 2020-08-20 ENCOUNTER — Encounter: Payer: BC Managed Care – PPO | Admitting: Rehabilitative and Restorative Service Providers"

## 2020-08-27 ENCOUNTER — Encounter: Payer: BC Managed Care – PPO | Admitting: Rehabilitative and Restorative Service Providers"

## 2020-09-01 ENCOUNTER — Other Ambulatory Visit: Payer: Self-pay

## 2020-09-01 ENCOUNTER — Ambulatory Visit (INDEPENDENT_AMBULATORY_CARE_PROVIDER_SITE_OTHER): Payer: BC Managed Care – PPO | Admitting: Sports Medicine

## 2020-09-01 DIAGNOSIS — M84375D Stress fracture, left foot, subsequent encounter for fracture with routine healing: Secondary | ICD-10-CM | POA: Diagnosis not present

## 2020-09-01 DIAGNOSIS — M47816 Spondylosis without myelopathy or radiculopathy, lumbar region: Secondary | ICD-10-CM

## 2020-09-01 NOTE — Progress Notes (Signed)
    Procedures performed today:    None.  Independent interpretation of notes and tests performed by another provider:   None.  Brief History, Exam, Impression, and Recommendations:    Stress fracture of metatarsal bone of left foot This is a pleasant 49 year old female, she had an MRI that showed T2 edema in the second, third, and fourth metatarsal shafts consistent with stress fracture, we downgraded her from a full boot to a postop shoe at the last visit, after 6 weeks she returns, pain-free, swelling free, she can return to regular shoes at this point, ideally rigid shoes and avoid barefoot walking. Otherwise return to see me as needed for the foot.  Lumbar spondylosis She also has multilevel lumbar DDD, has been describing back pain with anterolateral left thigh numbness and tingling. She has not yet completed her physical therapy, she will do another month of this. Of note she also told me she had a nerve conduction study, this was unremarkable but it sounds as though they had difficulty getting a good shock on the lateral femoral cutaneous nerve. She has also historically had pain all the way down her left leg to the foot. 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 Certainly loose waistband clothing and significant weight loss would be tremendously beneficial here as well. I will defer this to her PCP.    ___________________________________________ Gwen Her. Dianah Field, M.D., ABFM., CAQSM. Primary Care and [place] Instructor of [place] of [facility] of Medicine

## 2020-09-01 NOTE — Assessment & Plan Note (Addendum)
She also has multilevel lumbar DDD, has been describing back pain with anterolateral left thigh numbness and tingling. She has not yet completed her physical therapy, she will do another month of this. Of note she also told me she had a nerve conduction study, this was unremarkable but it sounds as though they had difficulty getting a good shock on the lateral femoral cutaneous nerve. She has also historically had pain all the way down her left leg to the foot. 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 Certainly loose waistband clothing and significant weight loss would be tremendously beneficial here as well. I will defer this to her PCP.

## 2020-09-01 NOTE — Assessment & Plan Note (Signed)
This is a pleasant 49 year old female, she had an MRI that showed T2 edema in the second, third, and fourth metatarsal shafts consistent with stress fracture, we downgraded her from a full boot to a postop shoe at the last visit, after 6 weeks she returns, pain-free, swelling free, she can return to regular shoes at this point, ideally rigid shoes and avoid barefoot walking. Otherwise return to see me as needed for the foot.

## 2020-09-03 ENCOUNTER — Encounter: Payer: BC Managed Care – PPO | Admitting: Rehabilitative and Restorative Service Providers"

## 2020-09-07 DIAGNOSIS — Z20822 Contact with and (suspected) exposure to covid-19: Secondary | ICD-10-CM | POA: Diagnosis not present

## 2020-10-20 ENCOUNTER — Ambulatory Visit: Payer: BC Managed Care – PPO | Admitting: Sports Medicine

## 2020-10-26 ENCOUNTER — Other Ambulatory Visit: Payer: Self-pay

## 2020-10-26 MED ORDER — LEVOTHYROXINE SODIUM 125 MCG PO TABS: 125.0000 ug | ORAL_TABLET | Freq: Every day | ORAL | 0 refills | Status: DC

## 2020-10-28 DIAGNOSIS — Z20822 Contact with and (suspected) exposure to covid-19: Secondary | ICD-10-CM | POA: Diagnosis not present

## 2020-10-29 DIAGNOSIS — E039 Hypothyroidism, unspecified: Secondary | ICD-10-CM | POA: Diagnosis not present

## 2020-10-30 ENCOUNTER — Other Ambulatory Visit: Payer: Self-pay | Admitting: Osteopathic Medicine

## 2020-10-30 DIAGNOSIS — E039 Hypothyroidism, unspecified: Secondary | ICD-10-CM

## 2020-10-30 LAB — TSH: TSH: 0.04 mIU/L — ABNORMAL LOW

## 2020-10-30 MED ORDER — LEVOTHYROXINE SODIUM 125 MCG PO TABS: 125.0000 ug | ORAL_TABLET | Freq: Every day | ORAL | 0 refills | Status: DC

## 2020-10-31 ENCOUNTER — Encounter: Payer: Self-pay | Admitting: Osteopathic Medicine

## 2020-11-02 NOTE — Telephone Encounter (Signed)
Insurance should double check - we changed the dose... make sure pharmacy filled the most recent Rx for levothyroxine

## 2020-12-07 ENCOUNTER — Telehealth: Payer: Self-pay

## 2020-12-07 DIAGNOSIS — R0602 Shortness of breath: Secondary | ICD-10-CM | POA: Diagnosis not present

## 2020-12-07 DIAGNOSIS — R079 Chest pain, unspecified: Secondary | ICD-10-CM | POA: Diagnosis not present

## 2020-12-07 DIAGNOSIS — R072 Precordial pain: Secondary | ICD-10-CM | POA: Diagnosis not present

## 2020-12-07 DIAGNOSIS — K219 Gastro-esophageal reflux disease without esophagitis: Secondary | ICD-10-CM | POA: Diagnosis not present

## 2020-12-07 DIAGNOSIS — Z79899 Other long term (current) drug therapy: Secondary | ICD-10-CM | POA: Diagnosis not present

## 2020-12-07 NOTE — Telephone Encounter (Signed)
Sarah Benjamin states she had a terrible episode of shortness of breath, chest pain and dizziness. I advised her to go to the ED ASAP. She agreed.

## 2020-12-09 DIAGNOSIS — R072 Precordial pain: Secondary | ICD-10-CM | POA: Diagnosis not present

## 2020-12-09 DIAGNOSIS — I10 Essential (primary) hypertension: Secondary | ICD-10-CM | POA: Diagnosis not present

## 2020-12-25 DIAGNOSIS — Z20822 Contact with and (suspected) exposure to covid-19: Secondary | ICD-10-CM | POA: Diagnosis not present

## 2021-01-21 ENCOUNTER — Encounter: Payer: Self-pay | Admitting: Osteopathic Medicine

## 2021-03-29 ENCOUNTER — Other Ambulatory Visit: Payer: Self-pay

## 2021-03-29 ENCOUNTER — Encounter: Payer: Self-pay | Admitting: Family Medicine

## 2021-03-29 ENCOUNTER — Ambulatory Visit: Payer: BC Managed Care – PPO | Admitting: Family Medicine

## 2021-03-29 VITALS — BP 156/84 | HR 70 | Temp 98.1°F | Resp 18

## 2021-03-29 DIAGNOSIS — J0140 Acute pansinusitis, unspecified: Secondary | ICD-10-CM | POA: Diagnosis not present

## 2021-03-29 MED ORDER — AMOXICILLIN-POT CLAVULANATE 875-125 MG PO TABS: 1.0000 | ORAL_TABLET | Freq: Two times a day (BID) | ORAL | 0 refills | Status: AC

## 2021-03-29 MED ORDER — BENZONATATE 100 MG PO CAPS: 100.0000 mg | ORAL_CAPSULE | Freq: Two times a day (BID) | ORAL | 0 refills | Status: DC | PRN

## 2021-03-29 MED ORDER — AZELASTINE HCL 0.1 % NA SOLN
2.0000 | Freq: Two times a day (BID) | NASAL | 1 refills | Status: DC
Start: 2021-03-29 — End: 2021-09-29

## 2021-03-29 NOTE — Patient Instructions (Signed)
Warm liquids, honey, lemon, salt water gargles, Tylenol/Ibuprofen, Over-the-counter cough/cold medicines, mucinex, humidifier, warm compresses.  Test for COVID before returning to work - if positive, quarantine appropriately and wait on antibiotic. If negative, go ahead and start antibiotic.

## 2021-03-29 NOTE — Progress Notes (Signed)
Acute Office Visit  Subjective:    Patient ID: Sarah Benjamin, female    DOB: 19-Jun-1971, 50 y.o.   MRN: [id number]  Chief Complaint  Patient presents with  . Sinusitis    HPI Patient is in today for sinusitis symptoms.   States she has had seasonal allergy symptoms all spring (itchy/watery eyes, sneezing, rhinoirrhea), but the past 1-1.5 weeks have been progressively worsening. She is now reporting severe frontal/ethmoid sinus pressure 8/10 with bilateral ear pressure left being slightly worse (5/10). She is having some nasal congestion, PND, and mild cough. Her worse symptom is the sinus pain/pressure. She denies any chest pain, shortness of breath, wheezing, fatigue, nausea, vomiting, diarrhea, headache, loss of taste/smell. She does have a history of asthma and seasonal allergies. She has been taking Xyzal every day this spring, but increased to BID once her symptoms worsened.      Past Medical History:  Diagnosis Date  . Anemia   . Thyroid disease     History reviewed. No pertinent surgical history.  Family History  Problem Relation Age of Onset  . Diabetes Mother   . Diabetes Brother     Social History   Socioeconomic History  . Marital status: Divorced    Spouse name: Not on file  . Number of children: Not on file  . Years of education: Not on file  . Highest education level: Not on file  Occupational History  . Not on file  Tobacco Use  . Smoking status: Never Smoker  . Smokeless tobacco: Never Used  Substance and Sexual Activity  . Alcohol use: No  . Drug use: No  . Sexual activity: Not on file  Other Topics Concern  . Not on file  Social History Narrative  . Not on file   Social Determinants of Health   Financial Resource Strain: Not on file  Food Insecurity: Not on file  Transportation Needs: Not on file  Physical Activity: Not on file  Stress: Not on file  Social Connections: Not on file  Intimate Partner Violence: Not on file     Outpatient Medications Prior to Visit  Medication Sig Dispense Refill  . albuterol (PROVENTIL HFA;VENTOLIN HFA) 108 (90 Base) MCG/ACT inhaler Inhale 2 puffs into the lungs every 4 (four) hours as needed for wheezing or shortness of breath. 2 Inhaler 1  . Calcium Carbonate-Vitamin D 600-400 MG-UNIT tablet Take 1 tablet by mouth 2 (two) times daily. 60 tablet 11  . EPINEPHrine 0.3 mg/0.3 mL IJ SOAJ injection Inject into the skin.    . fluticasone (FLONASE) 50 MCG/ACT nasal spray Place 2 sprays into both nostrils daily. 48 g 3  . gabapentin (NEURONTIN) 300 MG capsule One tab PO qHS for a week, then BID for a week, then TID. May double weekly to a max of 3,600mg /day 90 capsule 3  . levothyroxine (SYNTHROID) 125 MCG tablet Take 1 tablet (125 mcg total) by mouth daily before breakfast. SKIP ONE DAY PER WEEK. Due to recheck labs early Feb 2022 60 tablet 0  . losartan-hydrochlorothiazide (HYZAAR) 50-12.5 MG tablet Take 1 tablet by mouth daily. 90 tablet 3  . meloxicam (MOBIC) 15 MG tablet One tab PO qAM with a meal for 2 weeks, then daily prn pain. 30 tablet 3  . metFORMIN (GLUCOPHAGE XR) 500 MG 24 hr tablet Take 1 tablet (500 mg total) by mouth daily. 90 tablet 3   No facility-administered medications prior to visit.    No Known Allergies  Review of  Systems All review of systems negative except what is listed in the HPI     Objective:    Physical Exam Vitals reviewed.  Constitutional:      Appearance: Normal appearance.  HENT:     Head: Normocephalic and atraumatic.     Right Ear: Tympanic membrane normal.     Left Ear: A middle ear effusion is present.     Nose: Nose normal.     Mouth/Throat:     Mouth: Mucous membranes are moist.     Pharynx: Oropharynx is clear.  Cardiovascular:     Rate and Rhythm: Normal rate and regular rhythm.     Pulses: Normal pulses.     Heart sounds: Normal heart sounds.  Pulmonary:     Effort: Pulmonary effort is normal.     Breath sounds: Normal  breath sounds. No wheezing, rhonchi or rales.  Musculoskeletal:     Cervical back: Normal range of motion and neck supple. No tenderness.  Lymphadenopathy:     Cervical: No cervical adenopathy.  Skin:    General: Skin is warm and dry.  Neurological:     General: No focal deficit present.     Mental Status: She is alert and oriented to person, place, and time. Mental status is at baseline.  Psychiatric:        Mood and Affect: Mood normal.        Behavior: Behavior normal.        Thought Content: Thought content normal.        Judgment: Judgment normal.     BP (!) 156/84   Pulse 70   Temp 98.1 F (36.7 C)   Resp 18   SpO2 97%  Wt Readings from Last 3 Encounters:  07/13/20 224 lb (101.6 kg)  02/17/20 223 lb (101.2 kg)  07/29/19 215 lb (97.5 kg)    Health Maintenance Due  Topic Date Due  . COVID-19 Vaccine (1) Never done  . HIV Screening  Never done  . Hepatitis C Screening  Never done  . PAP SMEAR-Modifier  Never done  . COLONOSCOPY (Pts 45-58yrs Insurance coverage will need to be confirmed)  Never done    There are no preventive care reminders to display for this patient.   Lab Results  Component Value Date   TSH 0.04 (L) 10/29/2020   Lab Results  Component Value Date   WBC 7.0 07/13/2020   HGB 12.2 07/13/2020   HCT 37.6 07/13/2020   MCV 87.0 07/13/2020   PLT 241 07/13/2020   Lab Results  Component Value Date   NA 138 07/13/2020   K 4.5 07/13/2020   CO2 30 07/13/2020   GLUCOSE 109 (H) 07/13/2020   BUN 21 07/13/2020   CREATININE 0.61 07/13/2020   BILITOT 0.4 07/13/2020   ALKPHOS 73 02/16/2017   AST 41 (H) 07/13/2020   ALT 59 (H) 07/13/2020   PROT 6.6 07/13/2020   ALBUMIN 4.2 02/16/2017   CALCIUM 9.1 07/13/2020   Lab Results  Component Value Date   CHOL 176 07/13/2020   Lab Results  Component Value Date   HDL 48 (L) 07/13/2020   Lab Results  Component Value Date   LDLCALC 106 (H) 07/13/2020   Lab Results  Component Value Date   TRIG 122  07/13/2020   Lab Results  Component Value Date   CHOLHDL 3.7 07/13/2020   Lab Results  Component Value Date   HGBA1C 6.5 (H) 07/13/2020       Assessment & Plan:  1. Acute non-recurrent pansinusitis Patient with duration and severity consistent with sinusitis. Will go ahead and treat with Augmentin. She states her insurance no longer covers Flonase, so I will send in some Astelin nasal spray to see if this will be covered - if not, she can purchase OTC store brand. Recommend she continue daily Xyzal, rest, hydration, humidifier use, warm compresses, steam showers, warm liquids with honey/lemon. Cough is still relatively mild, but will send in some Tessalon if needed - discussed pulmonary hygiene. Educated on signs and symptoms requiring further evaluation.    - azelastine (ASTELIN) 0.1 % nasal spray; Place 2 sprays into both nostrils 2 (two) times daily. Use in each nostril as directed  Dispense: 301 mL; Refill: 1 - amoxicillin-clavulanate (AUGMENTIN) 875-125 MG tablet; Take 1 tablet by mouth 2 (two) times daily for 5 days.  Dispense: 10 tablet; Refill: 0 - benzonatate (TESSALON) 100 MG capsule; Take 1 capsule (100 mg total) by mouth 2 (two) times daily as needed for cough.  Dispense: 20 capsule; Refill: 0  Recommend she take a rapid COVID test to be sure she can return to work safely - if positive, no need to start antibiotic right away. Education provided  BP elevated today, but reports she has not yet taken her medication this morning. Encouraged compliance and home monitoring of BP over the next week or so. If BP remains elevated, let us know so we can adjust medications appropriately. Diet and exercise reinforced.    Follow-up if symptoms worsen or fail to improve.   Terrilyn Saver, NP

## 2021-06-14 ENCOUNTER — Ambulatory Visit (INDEPENDENT_AMBULATORY_CARE_PROVIDER_SITE_OTHER): Payer: BC Managed Care – PPO

## 2021-06-14 ENCOUNTER — Other Ambulatory Visit: Payer: Self-pay

## 2021-06-14 ENCOUNTER — Ambulatory Visit: Payer: BC Managed Care – PPO | Admitting: Osteopathic Medicine

## 2021-06-14 ENCOUNTER — Encounter: Payer: Self-pay | Admitting: Osteopathic Medicine

## 2021-06-14 VITALS — BP 120/71 | HR 62 | Temp 98.9°F | Wt 225.0 lb

## 2021-06-14 DIAGNOSIS — R7303 Prediabetes: Secondary | ICD-10-CM

## 2021-06-14 DIAGNOSIS — M79605 Pain in left leg: Secondary | ICD-10-CM | POA: Diagnosis not present

## 2021-06-14 DIAGNOSIS — I10 Essential (primary) hypertension: Secondary | ICD-10-CM | POA: Diagnosis not present

## 2021-06-14 DIAGNOSIS — L659 Nonscarring hair loss, unspecified: Secondary | ICD-10-CM | POA: Diagnosis not present

## 2021-06-14 DIAGNOSIS — E039 Hypothyroidism, unspecified: Secondary | ICD-10-CM

## 2021-06-14 DIAGNOSIS — R600 Localized edema: Secondary | ICD-10-CM | POA: Diagnosis not present

## 2021-06-14 NOTE — Patient Instructions (Signed)
Plan: Labs today (Room 135 downstairs) Ultrasound (Room 110 downstairs)  Will call you w/ results!

## 2021-06-14 NOTE — Progress Notes (Signed)
Sarah Benjamin is a 50 y.o. female who presents to  [place] at [facility]  today, 06/14/21, seeking care for the following:  Left leg pain ongoing about a week.  No injury.  Describes soreness in left lateral calf area, had some bruising there last week which patient reports was spontaneous, still a bit tender but overall better.  No significant edema, no obvious asymmetry.  Patient states leg feels "heavy" but has no weakness issues or loss of sensation. Concern for some patches of hair loss around temples, her glasses sit right here, she is a bit nervous given thyroid issues.     ASSESSMENT & PLAN with other pertinent findings:  The primary encounter diagnosis was Left leg pain. Diagnoses of Hypothyroidism, unspecified type, Prediabetes, Essential hypertension, and Hair loss were also pertinent to this visit.   Benign exam, patient reports she is doing a lot better this week as opposed to last week when she initially made the appointment.  Suspect probable superficial thrombophlebitis based on history/description of symptoms, low suspicion for DVT at this point but worth evaluating given patient's symptoms.  Patient is due for labs    Patient Instructions  Plan: Labs today (Room 135 downstairs) Ultrasound (Room 110 downstairs)  Will call you w/ results!   Orders Placed This Encounter  Procedures   US Venous Img Lower Unilateral Left (DVT)   CBC   COMPLETE METABOLIC PANEL WITH GFR   Lipid panel   TSH   Hemoglobin A1c    No orders of the defined types were placed in this encounter.    See below for relevant physical exam findings  See below for recent lab and imaging results reviewed  Medications, allergies, PMH, PSH, SocH, FamH reviewed below    Follow-up instructions: Return for RECHECK PENDING RESULTS / IF WORSE OR CHANGE.                                        Exam:  BP 120/71 (BP  Location: Left Arm, Patient Position: Sitting, Cuff Size: Large)   Pulse 62   Temp 98.9 F (37.2 C) (Oral)   Wt 225 lb 0.6 oz (102.1 kg)   BMI 41.16 kg/m  Constitutional: VS see above. General Appearance: alert, well-developed, well-nourished, NAD Neck: No masses, trachea midline.  Respiratory: Normal respiratory effort. no wheeze, no rhonchi, no rales Cardiovascular: S1/S2 normal, no murmur, no rub/gallop auscultated. RRR.  Negative Homans' sign bilaterally.  No lower extremity edema. Musculoskeletal: Gait normal. Symmetric and independent movement of all extremities Neurological: Normal balance/coordination. No tremor. Skin: warm, dry, intact.  No significant varicosities, she does have some spider veins in the lower extremities which do not appear otherwise abnormal Psychiatric: Normal judgment/insight. Normal mood and affect. Oriented x3.   Current Meds  Medication Sig   albuterol (PROVENTIL HFA;VENTOLIN HFA) 108 (90 Base) MCG/ACT inhaler Inhale 2 puffs into the lungs every 4 (four) hours as needed for wheezing or shortness of breath.   azelastine (ASTELIN) 0.1 % nasal spray Place 2 sprays into both nostrils 2 (two) times daily. Use in each nostril as directed   Calcium Carbonate-Vitamin D 600-400 MG-UNIT tablet Take 1 tablet by mouth 2 (two) times daily.   EPINEPHrine 0.3 mg/0.3 mL IJ SOAJ injection Inject into the skin.   fluticasone (FLONASE) 50 MCG/ACT nasal spray Place 2 sprays into both nostrils daily.  levothyroxine (SYNTHROID) 125 MCG tablet Take 1 tablet (125 mcg total) by mouth daily before breakfast. SKIP ONE DAY PER WEEK. Due to recheck labs early Feb 2022   losartan-hydrochlorothiazide (HYZAAR) 50-12.5 MG tablet Take 1 tablet by mouth daily.   meloxicam (MOBIC) 15 MG tablet One tab PO qAM with a meal for 2 weeks, then daily prn pain.    No Known Allergies  Patient Active Problem List   Diagnosis Date Noted   Stress fracture of metatarsal bone of left foot 07/08/2020    Arthralgia of metacarpophalangeal joint, right third 09/18/2018   Allergic rhinitis 06/12/2018   Transaminitis 06/12/2018   BPPV (benign paroxysmal positional vertigo), right 06/04/2018   Essential hypertension 02/12/2018   Numbness and tingling of left side of face 02/12/2018   Prediabetes 06/21/2017   Irregular menses 03/30/2017   Dizziness 04/19/2016   Seasonal allergies 04/19/2016   Sinus headache 04/19/2016   History of Bell's palsy 04/19/2016   PFO (patent foramen ovale) 01/12/2016   Pain in the chest 01/12/2016   Lumbar spondylosis 04/17/2015   Hip pain 04/17/2015   Mild persistent asthma without complication [id number]   Hypothyroid 01/13/2014   Constipation 01/13/2014   Migraine 01/13/2014    Family History  Problem Relation Age of Onset   Diabetes Mother    Diabetes Brother     Social History   Tobacco Use  Smoking Status Never  Smokeless Tobacco Never    No past surgical history on file.  Immunization History  Administered Date(s) Administered   Pneumococcal Polysaccharide-23 01/09/2013   Tdap 07/26/2018    No results found for this or any previous visit (from the past 2160 hour(s)).  US Venous Img Lower Unilateral Left (DVT)  Result Date: 06/14/2021 CLINICAL DATA:  Left lower extremity pain and edema. EXAM: LEFT LOWER EXTREMITY VENOUS DOPPLER ULTRASOUND TECHNIQUE: Gray-scale sonography with graded compression, as well as color Doppler and duplex ultrasound were performed to evaluate the lower extremity deep venous systems from the level of the common femoral vein and including the common femoral, femoral, profunda femoral, popliteal and calf veins including the posterior tibial, peroneal and gastrocnemius veins when visible. The superficial great saphenous vein was also interrogated. Spectral Doppler was utilized to evaluate flow at rest and with distal augmentation maneuvers in the common femoral, femoral and popliteal veins. COMPARISON:  None. FINDINGS:  Contralateral Common Femoral Vein: Respiratory phasicity is normal and symmetric with the symptomatic side. No evidence of thrombus. Normal compressibility. Common Femoral Vein: No evidence of thrombus. Normal compressibility, respiratory phasicity and response to augmentation. Saphenofemoral Junction: No evidence of thrombus. Normal compressibility and flow on color Doppler imaging. Profunda Femoral Vein: No evidence of thrombus. Normal compressibility and flow on color Doppler imaging. Femoral Vein: No evidence of thrombus. Normal compressibility, respiratory phasicity and response to augmentation. Popliteal Vein: No evidence of thrombus. Normal compressibility, respiratory phasicity and response to augmentation. Calf Veins: No evidence of thrombus. Normal compressibility and flow on color Doppler imaging. Superficial Great Saphenous Vein: No evidence of thrombus. Normal compressibility. Venous Reflux:  None. Other Findings: No evidence of superficial thrombophlebitis or abnormal fluid collection. IMPRESSION: No evidence of left lower extremity deep venous thrombosis. Electronically Signed   By: Aletta Edouard M.D.   On: 06/14/2021 14:26       All questions at time of visit were answered - patient instructed to contact office with any additional concerns or updates. ER/RTC precautions were reviewed with the patient as applicable.   Please note: manual typing  as well as voice recognition software may have been used to produce this document - typos may escape review. Please contact Dr. Sheppard Coil for any needed clarifications.

## 2021-06-15 LAB — CBC
HCT: 38 % (ref 35.0–45.0)
Hemoglobin: 12.2 g/dL (ref 11.7–15.5)
MCH: 26.7 pg — ABNORMAL LOW (ref 27.0–33.0)
MCHC: 32.1 g/dL (ref 32.0–36.0)
MCV: 83.2 fL (ref 80.0–100.0)
MPV: 13.9 fL — ABNORMAL HIGH (ref 7.5–12.5)
Platelets: 224 10*3/uL (ref 140–400)
RBC: 4.57 10*6/uL (ref 3.80–5.10)
RDW: 15.7 % — ABNORMAL HIGH (ref 11.0–15.0)
WBC: 6.6 10*3/uL (ref 3.8–10.8)

## 2021-06-15 LAB — COMPLETE METABOLIC PANEL WITH GFR
AG Ratio: 1.5 (calc) (ref 1.0–2.5)
ALT: 46 U/L — ABNORMAL HIGH (ref 6–29)
AST: 32 U/L (ref 10–35)
Albumin: 4.1 g/dL (ref 3.6–5.1)
Alkaline phosphatase (APISO): 84 U/L (ref 31–125)
BUN: 11 mg/dL (ref 7–25)
CO2: 27 mmol/L (ref 20–32)
Calcium: 8.9 mg/dL (ref 8.6–10.2)
Chloride: 103 mmol/L (ref 98–110)
Creat: 0.59 mg/dL (ref 0.50–0.99)
Globulin: 2.7 g/dL (calc) (ref 1.9–3.7)
Glucose, Bld: 75 mg/dL (ref 65–99)
Potassium: 3.8 mmol/L (ref 3.5–5.3)
Sodium: 138 mmol/L (ref 135–146)
Total Bilirubin: 0.6 mg/dL (ref 0.2–1.2)
Total Protein: 6.8 g/dL (ref 6.1–8.1)
eGFR: 111 mL/min/{1.73_m2} (ref >=60)

## 2021-06-15 LAB — LIPID PANEL
Cholesterol: 180 mg/dL (ref <200)
HDL: 40 mg/dL — ABNORMAL LOW (ref >=50)
LDL Cholesterol (Calc): 110 mg/dL (calc) — ABNORMAL HIGH
Non-HDL Cholesterol (Calc): 140 mg/dL (calc) — ABNORMAL HIGH (ref <130)
Total CHOL/HDL Ratio: 4.5 (calc) (ref <5.0)
Triglycerides: 187 mg/dL — ABNORMAL HIGH (ref <150)

## 2021-06-15 LAB — TSH: TSH: 1.32 mIU/L

## 2021-06-15 LAB — HEMOGLOBIN A1C
Hgb A1c MFr Bld: 6.7 % of total Hgb — ABNORMAL HIGH (ref <5.7)
Mean Plasma Glucose: 146 mg/dL
eAG (mmol/L): 8.1 mmol/L

## 2021-06-19 DIAGNOSIS — Z20822 Contact with and (suspected) exposure to covid-19: Secondary | ICD-10-CM | POA: Diagnosis not present

## 2021-06-24 DIAGNOSIS — Z20822 Contact with and (suspected) exposure to covid-19: Secondary | ICD-10-CM | POA: Diagnosis not present

## 2021-07-24 ENCOUNTER — Other Ambulatory Visit: Payer: Self-pay | Admitting: Osteopathic Medicine

## 2021-07-28 ENCOUNTER — Other Ambulatory Visit: Payer: Self-pay

## 2021-07-28 MED ORDER — LEVOTHYROXINE SODIUM 125 MCG PO TABS: 125.0000 ug | ORAL_TABLET | Freq: Every day | ORAL | 1 refills | Status: DC

## 2021-09-29 ENCOUNTER — Other Ambulatory Visit: Payer: Self-pay

## 2021-09-29 ENCOUNTER — Ambulatory Visit (INDEPENDENT_AMBULATORY_CARE_PROVIDER_SITE_OTHER): Payer: BC Managed Care – PPO

## 2021-09-29 ENCOUNTER — Ambulatory Visit: Payer: BC Managed Care – PPO | Admitting: Sports Medicine

## 2021-09-29 DIAGNOSIS — M25542 Pain in joints of left hand: Secondary | ICD-10-CM | POA: Diagnosis not present

## 2021-09-29 DIAGNOSIS — M84375D Stress fracture, left foot, subsequent encounter for fracture with routine healing: Secondary | ICD-10-CM

## 2021-09-29 DIAGNOSIS — M25511 Pain in right shoulder: Secondary | ICD-10-CM

## 2021-09-29 DIAGNOSIS — M25541 Pain in joints of right hand: Secondary | ICD-10-CM

## 2021-09-29 DIAGNOSIS — M19072 Primary osteoarthritis, left ankle and foot: Secondary | ICD-10-CM | POA: Diagnosis not present

## 2021-09-29 DIAGNOSIS — M7541 Impingement syndrome of right shoulder: Secondary | ICD-10-CM

## 2021-09-29 DIAGNOSIS — M79672 Pain in left foot: Secondary | ICD-10-CM | POA: Diagnosis not present

## 2021-09-29 MED ORDER — PREDNISONE 50 MG PO TABS: ORAL_TABLET | ORAL | 0 refills | Status: DC

## 2021-09-29 NOTE — Progress Notes (Addendum)
    Procedures performed today:    None.  Independent interpretation of notes and tests performed by another provider:   None.  Brief History, Exam, Impression, and Recommendations:    Impingement syndrome, shoulder, right Pleasant 50 year old female, several weeks of pain in the right shoulder, localized over the deltoid, worse with abduction, positive Neer's, Hawkins, empty can signs, good strength. Adding cuff conditioning exercises, x-rays. Return to see me in 6 weeks, injection if no better.  Arthralgia of metacarpophalangeal joint, left third Left third MCP arthralgia, previously treated in 2019 but on the right side, she responded well to a burst of prednisone, repeating today.  Stress fracture of metatarsal bone of left foot Increasing pain left foot dorsal aspect, TMT junction, she does endorse some TMT bossing. Adding updated x-rays, prednisone as above should help, return to see me in 4 weeks for this, will consider a TMT injection if no better.    ___________________________________________ Gwen Her. Dianah Field, M.D., ABFM., CAQSM. Primary Care and [place] Instructor of [place] [facility]

## 2021-09-29 NOTE — Assessment & Plan Note (Signed)
Increasing pain left foot dorsal aspect, TMT junction, she does endorse some TMT bossing. Adding updated x-rays, prednisone as above should help, return to see me in 4 weeks for this, will consider a TMT injection if no better.

## 2021-09-29 NOTE — Assessment & Plan Note (Signed)
Pleasant 50 year old female, several weeks of pain in the right shoulder, localized over the deltoid, worse with abduction, positive Neer's, Hawkins, empty can signs, good strength. Adding cuff conditioning exercises, x-rays. Return to see me in 6 weeks, injection if no better.

## 2021-09-29 NOTE — Assessment & Plan Note (Addendum)
Left third MCP arthralgia, previously treated in 2019 but on the right side, she responded well to a burst of prednisone, repeating today.

## 2021-10-05 ENCOUNTER — Encounter: Payer: Self-pay | Admitting: Family Medicine

## 2021-10-05 ENCOUNTER — Ambulatory Visit (INDEPENDENT_AMBULATORY_CARE_PROVIDER_SITE_OTHER): Payer: BC Managed Care – PPO | Admitting: Family Medicine

## 2021-10-05 ENCOUNTER — Other Ambulatory Visit: Payer: Self-pay

## 2021-10-05 VITALS — BP 134/78 | HR 60 | Temp 97.7°F | Ht 62.0 in | Wt 217.8 lb

## 2021-10-05 DIAGNOSIS — Z23 Encounter for immunization: Secondary | ICD-10-CM | POA: Diagnosis not present

## 2021-10-05 DIAGNOSIS — I10 Essential (primary) hypertension: Secondary | ICD-10-CM

## 2021-10-05 DIAGNOSIS — Z0000 Encounter for general adult medical examination without abnormal findings: Secondary | ICD-10-CM | POA: Diagnosis not present

## 2021-10-05 DIAGNOSIS — J4530 Mild persistent asthma, uncomplicated: Secondary | ICD-10-CM | POA: Diagnosis not present

## 2021-10-05 DIAGNOSIS — R7303 Prediabetes: Secondary | ICD-10-CM

## 2021-10-05 NOTE — Assessment & Plan Note (Signed)
Well adult Orders Placed This Encounter  Procedures  . Pneumococcal conjugate vaccine 20-valent (Prevnar 20)  . COMPLETE METABOLIC PANEL WITH GFR  . TSH  . HgB A1c  Screenings: Per lab orders.  Due for colon cancer screening.  She is undecided on what type of screening she would like to have. Immunizations: Prevnar 20 given today Anticipatory guidance/risk factor reduction: Recommendations per AVS

## 2021-10-05 NOTE — Patient Instructions (Signed)
Preventive Care 92-49 Years Old, Female Preventive care refers to lifestyle choices and visits with your health care provider that can promote health and wellness. Preventive care visits are also called wellness exams. What can I expect for my preventive care visit? Counseling Your health care provider may ask you questions about your: Medical history, including: Past medical problems. Family medical history. Pregnancy history. Current health, including: Menstrual cycle. Method of birth control. Emotional well-being. Home life and relationship well-being. Sexual activity and sexual health. Lifestyle, including: Alcohol, nicotine or tobacco, and drug use. Access to firearms. Diet, exercise, and sleep habits. Work and work Statistician. Sunscreen use. Safety issues such as seatbelt and bike helmet use. Physical exam Your health care provider will check your: Height and weight. These may be used to calculate your BMI (body mass index). BMI is a measurement that tells if you are at a healthy weight. Waist circumference. This measures the distance around your waistline. This measurement also tells if you are at a healthy weight and may help predict your risk of certain diseases, such as type 2 diabetes and high blood pressure. Heart rate and blood pressure. Body temperature. Skin for abnormal spots. What immunizations do I need? Vaccines are usually given at various ages, according to a schedule. Your health care provider will recommend vaccines for you based on your age, medical history, and lifestyle or other factors, such as travel or where you work. What tests do I need? Screening Your health care provider may recommend screening tests for certain conditions. This may include: Lipid and cholesterol levels. Diabetes screening. This is done by checking your blood sugar (glucose) after you have not eaten for a while (fasting). Pelvic exam and Pap test. Hepatitis B test. Hepatitis C  test. HIV (human immunodeficiency virus) test. STI (sexually transmitted infection) testing, if you are at risk. Lung cancer screening. Colorectal cancer screening. Mammogram. Talk with your health care provider about when you should start having regular mammograms. This may depend on whether you have a family history of breast cancer. BRCA-related cancer screening. This may be done if you have a family history of breast, ovarian, tubal, or peritoneal cancers. Bone density scan. This is done to screen for osteoporosis. Talk with your health care provider about your test results, treatment options, and if necessary, the need for more tests. Follow these instructions at home: Eating and drinking  Eat a diet that includes fresh fruits and vegetables, whole grains, lean protein, and low-fat dairy products. Take vitamin and mineral supplements as recommended by your health care provider. Do not drink alcohol if: Your health care provider tells you not to drink. You are pregnant, may be pregnant, or are planning to become pregnant. If you drink alcohol: Limit how much you have to 0-1 drink a day. Know how much alcohol is in your drink. In the [place] one drink equals one 12 oz bottle of beer (355 mL), one 5 oz glass of wine (148 mL), or one 1 oz glass of hard liquor (44 mL). Lifestyle Brush your teeth every morning and night with fluoride toothpaste. Floss one time each day. Exercise for at least 30 minutes 5 or more days each week. Do not use any products that contain nicotine or tobacco. These products include cigarettes, chewing tobacco, and vaping devices, such as e-cigarettes. If you need help quitting, ask your health care provider. Do not use drugs. If you are sexually active, practice safe sex. Use a condom or other form of protection to prevent  STIs. If you do not wish to become pregnant, use a form of birth control. If you plan to become pregnant, see your health care provider for a  prepregnancy visit. Take aspirin only as told by your health care provider. Make sure that you understand how much to take and what form to take. Work with your health care provider to find out whether it is safe and beneficial for you to take aspirin daily. Find healthy ways to manage stress, such as: Meditation, yoga, or listening to music. Journaling. Talking to a trusted person. Spending time with friends and family. Minimize exposure to UV radiation to reduce your risk of skin cancer. Safety Always wear your seat belt while driving or riding in a vehicle. Do not drive: If you have been drinking alcohol. Do not ride with someone who has been drinking. When you are tired or distracted. While texting. If you have been using any mind-altering substances or drugs. Wear a helmet and other protective equipment during sports activities. If you have firearms in your house, make sure you follow all gun safety procedures. Seek help if you have been physically or sexually abused. What's next? Visit your health care provider once a year for an annual wellness visit. Ask your health care provider how often you should have your eyes and teeth checked. Stay up to date on all vaccines. This information is not intended to replace advice given to you by your health care provider. Make sure you discuss any questions you have with your health care provider. Document Revised: 04/28/2021 Document Reviewed: 04/28/2021 Elsevier Patient Education  [employer].

## 2021-10-05 NOTE — Progress Notes (Signed)
Sarah Benjamin - 50 y.o. female MRN [id number]  Date of birth: 10-May-1971  Subjective Chief Complaint  Patient presents with   Annual Exam    HPI Sarah Benjamin is a 50 year old female here today for annual exam.  She is doing well at this time.  She has history of asthma, hypothyroidism, hypertension and prediabetes.  She is doing well with current medications.  She does exercise occasionally.  She is working on dietary changes.  She does see GYN for Pap smear.  She is a non-smoker.  She denies alcohol use.  She would like to have pneumonia vaccine.  Review of Systems  Constitutional:  Negative for chills, fever, malaise/fatigue and weight loss.  HENT:  Negative for congestion, ear pain and sore throat.   Eyes:  Negative for blurred vision, double vision and pain.  Respiratory:  Negative for cough and shortness of breath.   Cardiovascular:  Negative for chest pain and palpitations.  Gastrointestinal:  Negative for abdominal pain, blood in stool, constipation, heartburn and nausea.  Genitourinary:  Negative for dysuria and urgency.  Musculoskeletal:  Negative for joint pain and myalgias.  Neurological:  Negative for dizziness and headaches.  Endo/Heme/Allergies:  Does not bruise/bleed easily.  Psychiatric/Behavioral:  Negative for depression. The patient is not nervous/anxious and does not have insomnia.    No Known Allergies  Past Medical History:  Diagnosis Date   Anemia    Thyroid disease     History reviewed. No pertinent surgical history.  Social History   Socioeconomic History   Marital status: Divorced    Spouse name: Not on file   Number of children: Not on file   Years of education: Not on file   Highest education level: Not on file  Occupational History   Not on file  Tobacco Use   Smoking status: Never   Smokeless tobacco: Never  Substance and Sexual Activity   Alcohol use: No   Drug use: No   Sexual activity: Not on file  Other Topics Concern   Not on file   Social History Narrative   Not on file   Social Determinants of Health   Financial Resource Strain: Not on file  Food Insecurity: Not on file  Transportation Needs: Not on file  Physical Activity: Not on file  Stress: Not on file  Social Connections: Not on file    Family History  Problem Relation Age of Onset   Diabetes Mother    Diabetes Brother     Health Maintenance  Topic Date Due   HIV Screening  Never done   Hepatitis C Screening  Never done   COLONOSCOPY (Pts 45-36yrs Insurance coverage will need to be confirmed)  Never done   COVID-19 Vaccine (4 - Booster for Moderna series) 03/02/2021   INFLUENZA VACCINE  02/11/2022 (Originally 06/14/2021)   MAMMOGRAM  08/19/2022   PAP SMEAR-Modifier  02/04/2023   TETANUS/TDAP  07/26/2028   Pneumococcal Vaccine 22-40 Years old  Completed   HPV VACCINES  Aged Out     ----------------------------------------------------------------------------------------------------------------------------------------------------------------------------------------------------------------- Physical Exam BP 134/78 (BP Location: Left Arm, Patient Position: Sitting, Cuff Size: Large)   Pulse 60   Temp 97.7 F (36.5 C)   Ht 5\' 2"  (1.575 m)   Wt 217 lb 12.8 oz (98.8 kg)   SpO2 100%   BMI 39.84 kg/m   Physical Exam Constitutional:      General: She is not in acute distress. HENT:     Head: Normocephalic and atraumatic.  Right Ear: Tympanic membrane and ear canal normal.     Left Ear: Tympanic membrane and ear canal normal.     Nose: Nose normal.  Eyes:     General: No scleral icterus.    Conjunctiva/sclera: Conjunctivae normal.  Neck:     Thyroid: No thyromegaly.  Cardiovascular:     Rate and Rhythm: Normal rate and regular rhythm.     Heart sounds: Normal heart sounds.  Pulmonary:     Effort: Pulmonary effort is normal.     Breath sounds: Normal breath sounds.  Abdominal:     General: Bowel sounds are normal. There is no  distension.     Palpations: Abdomen is soft.     Tenderness: There is no abdominal tenderness. There is no guarding.  Musculoskeletal:        General: Normal range of motion.     Cervical back: Normal range of motion and neck supple.  Lymphadenopathy:     Cervical: No cervical adenopathy.  Skin:    General: Skin is warm and dry.     Findings: No rash.  Neurological:     General: No focal deficit present.     Mental Status: She is alert and oriented to person, place, and time.     Cranial Nerves: No cranial nerve deficit.     Coordination: Coordination normal.  Psychiatric:        Mood and Affect: Mood normal.        Behavior: Behavior normal.    ------------------------------------------------------------------------------------------------------------------------------------------------------------------------------------------------------------------- Assessment and Plan  Well adult exam Well adult Orders Placed This Encounter  Procedures   Pneumococcal conjugate vaccine 20-valent (Prevnar 20)   COMPLETE METABOLIC PANEL WITH GFR   TSH   HgB A1c  Screenings: Per lab orders.  Due for colon cancer screening.  She is undecided on what type of screening she would like to have. Immunizations: Prevnar 20 given today Anticipatory guidance/risk factor reduction: Recommendations per AVS   No orders of the defined types were placed in this encounter.   No follow-ups on file.    This visit occurred during the SARS-CoV-2 public health emergency.  Safety protocols were in place, including screening questions prior to the visit, additional usage of staff PPE, and extensive cleaning of exam room while observing appropriate contact time as indicated for disinfecting solutions.

## 2021-10-06 DIAGNOSIS — Z1231 Encounter for screening mammogram for malignant neoplasm of breast: Secondary | ICD-10-CM | POA: Diagnosis not present

## 2021-10-06 LAB — COMPLETE METABOLIC PANEL WITH GFR
AG Ratio: 1.7 (calc) (ref 1.0–2.5)
ALT: 25 U/L (ref 6–29)
AST: 17 U/L (ref 10–35)
Albumin: 4 g/dL (ref 3.6–5.1)
Alkaline phosphatase (APISO): 79 U/L (ref 31–125)
BUN: 20 mg/dL (ref 7–25)
CO2: 30 mmol/L (ref 20–32)
Calcium: 8.8 mg/dL (ref 8.6–10.2)
Chloride: 104 mmol/L (ref 98–110)
Creat: 0.69 mg/dL (ref 0.50–0.99)
Globulin: 2.4 g/dL (calc) (ref 1.9–3.7)
Glucose, Bld: 89 mg/dL (ref 65–99)
Potassium: 4.1 mmol/L (ref 3.5–5.3)
Sodium: 140 mmol/L (ref 135–146)
Total Bilirubin: 0.9 mg/dL (ref 0.2–1.2)
Total Protein: 6.4 g/dL (ref 6.1–8.1)
eGFR: 107 mL/min/{1.73_m2} (ref >=60)

## 2021-10-06 LAB — HM MAMMOGRAPHY

## 2021-10-06 LAB — TSH: TSH: 0.01 mIU/L — ABNORMAL LOW

## 2021-10-06 LAB — HEMOGLOBIN A1C
Hgb A1c MFr Bld: 6.5 % of total Hgb — ABNORMAL HIGH (ref <5.7)
Mean Plasma Glucose: 140 mg/dL
eAG (mmol/L): 7.7 mmol/L

## 2021-10-14 ENCOUNTER — Encounter: Payer: Self-pay | Admitting: Family Medicine

## 2021-10-21 ENCOUNTER — Other Ambulatory Visit: Payer: Self-pay | Admitting: Family Medicine

## 2021-10-21 DIAGNOSIS — E039 Hypothyroidism, unspecified: Secondary | ICD-10-CM

## 2021-10-21 MED ORDER — LEVOTHYROXINE SODIUM 112 MCG PO TABS: 112.0000 ug | ORAL_TABLET | Freq: Every day | ORAL | 1 refills | Status: DC

## 2021-10-27 ENCOUNTER — Other Ambulatory Visit: Payer: Self-pay

## 2021-10-27 DIAGNOSIS — R7303 Prediabetes: Secondary | ICD-10-CM

## 2021-10-27 MED ORDER — METFORMIN HCL ER 500 MG PO TB24: 500.0000 mg | ORAL_TABLET | Freq: Every day | ORAL | 3 refills | Status: DC

## 2021-11-30 ENCOUNTER — Telehealth: Payer: Self-pay | Admitting: Family Medicine

## 2021-11-30 DIAGNOSIS — Z1211 Encounter for screening for malignant neoplasm of colon: Secondary | ICD-10-CM

## 2021-11-30 NOTE — Telephone Encounter (Signed)
Referral placed  Thanks!

## 2021-11-30 NOTE — Telephone Encounter (Signed)
Pt wants to know if you can provide her with a referral for a colonoscopy since she is now 48.

## 2021-12-31 ENCOUNTER — Ambulatory Visit (AMBULATORY_SURGERY_CENTER): Payer: BC Managed Care – PPO | Admitting: *Deleted

## 2021-12-31 ENCOUNTER — Other Ambulatory Visit: Payer: Self-pay

## 2021-12-31 VITALS — Ht 62.0 in | Wt 175.0 lb

## 2021-12-31 DIAGNOSIS — Z1211 Encounter for screening for malignant neoplasm of colon: Secondary | ICD-10-CM

## 2021-12-31 MED ORDER — NA SULFATE-K SULFATE-MG SULF 17.5-3.13-1.6 GM/177ML PO SOLN: 2.0000 | Freq: Once | ORAL | 0 refills | Status: AC

## 2021-12-31 NOTE — Progress Notes (Signed)

## 2022-01-11 ENCOUNTER — Encounter: Payer: Self-pay | Admitting: Gastroenterology

## 2022-01-14 ENCOUNTER — Encounter: Payer: Self-pay | Admitting: Gastroenterology

## 2022-01-14 ENCOUNTER — Other Ambulatory Visit: Payer: Self-pay

## 2022-01-14 ENCOUNTER — Ambulatory Visit (AMBULATORY_SURGERY_CENTER): Payer: BC Managed Care – PPO | Admitting: Gastroenterology

## 2022-01-14 VITALS — BP 95/59 | HR 62 | Temp 97.8°F | Resp 13 | Ht 62.0 in | Wt 175.0 lb

## 2022-01-14 DIAGNOSIS — K5730 Diverticulosis of large intestine without perforation or abscess without bleeding: Secondary | ICD-10-CM

## 2022-01-14 DIAGNOSIS — K635 Polyp of colon: Secondary | ICD-10-CM | POA: Diagnosis not present

## 2022-01-14 DIAGNOSIS — Z1211 Encounter for screening for malignant neoplasm of colon: Secondary | ICD-10-CM

## 2022-01-14 DIAGNOSIS — D122 Benign neoplasm of ascending colon: Secondary | ICD-10-CM

## 2022-01-14 DIAGNOSIS — D125 Benign neoplasm of sigmoid colon: Secondary | ICD-10-CM

## 2022-01-14 HISTORY — PX: COLONOSCOPY: SHX174

## 2022-01-14 MED ORDER — SODIUM CHLORIDE 0.9 % IV SOLN: 500.0000 mL | Freq: Once | INTRAVENOUS | Status: DC

## 2022-01-14 NOTE — Progress Notes (Signed)
PT taken to PACU. Monitors in place. VSS. Report given to RN. 

## 2022-01-14 NOTE — Progress Notes (Signed)
? ?GASTROENTEROLOGY PROCEDURE H&P NOTE  ? ?Primary Care Physician: ?Luetta Nutting, DO ? ? ? ?Reason for Procedure:  Colon Cancer screening ? ?Plan:    Colonoscopy ? ?Patient is appropriate for endoscopic procedure(s) in the ambulatory ([facility]) setting. ? ?The nature of the procedure, as well as the risks, benefits, and alternatives were carefully and thoroughly reviewed with the patient. Ample time for discussion and questions allowed. The patient understood, was satisfied, and agreed to proceed.  ? ? ? ?HPI: ?Sarah Benjamin is a 51 y.o. female who presents for colonoscopy for routine Colon Cancer screening.  No active GI symptoms.  No known family history of colon cancer or related malignancy.  Patient is otherwise without complaints or active issues today. ? ?Past Medical History:  ?Diagnosis Date  ? Anemia   ? Thyroid disease   ? ? ?Past Surgical History:  ?Procedure Laterality Date  ? WISDOM TOOTH EXTRACTION    ? ? ?Prior to Admission medications   ?Medication Sig Start Date End Date Taking? Authorizing Provider  ?levothyroxine (SYNTHROID) 112 MCG tablet Take 1 tablet (112 mcg total) by mouth daily before breakfast. SKIP ONE DAY PER WEEK. 10/21/21  Yes Luetta Nutting, DO  ?losartan-hydrochlorothiazide (HYZAAR) 50-12.5 MG tablet TAKE 1 TABLET BY MOUTH DAILY 07/27/21  Yes Emeterio Reeve, DO  ?albuterol (PROVENTIL HFA;VENTOLIN HFA) 108 (90 Base) MCG/ACT inhaler Inhale 2 puffs into the lungs every 4 (four) hours as needed for wheezing or shortness of breath. 01/10/18   Trixie Dredge, PA-C  ?Calcium Carbonate-Vitamin D 600-400 MG-UNIT tablet Take 1 tablet by mouth 2 (two) times daily. ?Patient not taking: Reported on 12/31/2021 07/21/20   Silverio Decamp, MD  ?EPINEPHrine 0.3 mg/0.3 mL IJ SOAJ injection Inject into the skin. ?Patient not taking: Reported on 12/31/2021 04/06/17   [provider]  ?metFORMIN (GLUCOPHAGE XR) 500 MG 24 hr tablet Take 1 tablet (500 mg total) by mouth  daily. ?Patient not taking: Reported on 12/31/2021 10/27/21   Luetta Nutting, DO  ? ? ?Current Outpatient Medications  ?Medication Sig Dispense Refill  ? levothyroxine (SYNTHROID) 112 MCG tablet Take 1 tablet (112 mcg total) by mouth daily before breakfast. SKIP ONE DAY PER WEEK. 90 tablet 1  ? losartan-hydrochlorothiazide (HYZAAR) 50-12.5 MG tablet TAKE 1 TABLET BY MOUTH DAILY 90 tablet 3  ? albuterol (PROVENTIL HFA;VENTOLIN HFA) 108 (90 Base) MCG/ACT inhaler Inhale 2 puffs into the lungs every 4 (four) hours as needed for wheezing or shortness of breath. 2 Inhaler 1  ? Calcium Carbonate-Vitamin D 600-400 MG-UNIT tablet Take 1 tablet by mouth 2 (two) times daily. (Patient not taking: Reported on 12/31/2021) 60 tablet 11  ? EPINEPHrine 0.3 mg/0.3 mL IJ SOAJ injection Inject into the skin. (Patient not taking: Reported on 12/31/2021)    ? metFORMIN (GLUCOPHAGE XR) 500 MG 24 hr tablet Take 1 tablet (500 mg total) by mouth daily. (Patient not taking: Reported on 12/31/2021) 90 tablet 3  ? ?Current Facility-Administered Medications  ?Medication Dose Route Frequency Provider Last Rate Last Admin  ? 0.9 %  sodium chloride infusion  500 mL Intravenous Once Gonzalo Waymire V, DO      ? ? ?Allergies as of 01/14/2022  ? (No Known Allergies)  ? ? ?Family History  ?Problem Relation Age of Onset  ? Diabetes Mother   ? Diabetes Brother   ? Colon cancer Neg Hx   ? Colon polyps Neg Hx   ? Esophageal cancer Neg Hx   ? Rectal cancer Neg Hx   ?  Stomach cancer Neg Hx   ? ? ?Social History  ? ?Socioeconomic History  ? Marital status: Divorced  ?  Spouse name: Not on file  ? Number of children: Not on file  ? Years of education: Not on file  ? Highest education level: Not on file  ?Occupational History  ? Not on file  ?Tobacco Use  ? Smoking status: Never  ? Smokeless tobacco: Never  ?Vaping Use  ? Vaping Use: Never used  ?Substance and Sexual Activity  ? Alcohol use: No  ? Drug use: No  ? Sexual activity: Not on file  ?Other Topics Concern   ? Not on file  ?Social History Narrative  ? Not on file  ? ?Social Determinants of Health  ? ?Financial Resource Strain: Not on file  ?Food Insecurity: Not on file  ?Transportation Needs: Not on file  ?Physical Activity: Not on file  ?Stress: Not on file  ?Social Connections: Not on file  ?Intimate Partner Violence: Not on file  ? ? ?Physical Exam: ?Vital signs in last 24 hours: ?@BP  127/75   Pulse 65   Temp 97.8 ?F (36.6 ?C)   Ht 5\' 2"  (1.575 m)   Wt 175 lb (79.4 kg)   SpO2 97%   BMI 32.01 kg/m?  ?GEN: NAD ?EYE: Sclerae anicteric ?ENT: MMM ?CV: Non-tachycardic ?Pulm: CTA b/l ?GI: Soft, NT/ND ?NEURO:  Alert & Oriented x 3 ? ? ?Gerrit Heck, DO ?[facility] ? ? ?01/14/2022 1:22 PM ? ?

## 2022-01-14 NOTE — Progress Notes (Signed)
Called to room to assist during endoscopic procedure.  Patient ID and intended procedure confirmed with present staff. Received instructions for my participation in the procedure from the performing physician.  

## 2022-01-14 NOTE — Progress Notes (Signed)
Pt's states no medical or surgical changes since previsit or office visit. 

## 2022-01-14 NOTE — Patient Instructions (Addendum)
Resume previous diet and current medications.  Await pathology results. Handouts given for polyps and diverticulosis. ? ?YOU HAD AN ENDOSCOPIC PROCEDURE TODAY AT [facility]:   Refer to the procedure report that was given to you for any specific questions about what was found during the examination.  If the procedure report does not answer your questions, please call your gastroenterologist to clarify.  If you requested that your care partner not be given the details of your procedure findings, then the procedure report has been included in a sealed envelope for you to review at your convenience later. ? ?YOU SHOULD EXPECT: Some feelings of bloating in the abdomen. Passage of more gas than usual.  Walking can help get rid of the air that was put into your GI tract during the procedure and reduce the bloating. If you had a lower endoscopy (such as a colonoscopy or flexible sigmoidoscopy) you may notice spotting of blood in your stool or on the toilet paper. If you underwent a bowel prep for your procedure, you may not have a normal bowel movement for a few days. ? ?Please Note:  You might notice some irritation and congestion in your nose or some drainage.  This is from the oxygen used during your procedure.  There is no need for concern and it should clear up in a day or so. ? ?SYMPTOMS TO REPORT IMMEDIATELY: ? ?Following lower endoscopy (colonoscopy or flexible sigmoidoscopy): ? Excessive amounts of blood in the stool ? Significant tenderness or worsening of abdominal pains ? Swelling of the abdomen that is new, acute ? Fever of 100?F or higher ? ? ?For urgent or emergent issues, a gastroenterologist can be reached at any hour by calling [phone]. ?Do not use MyChart messaging for urgent concerns.  ? ? ?DIET:  We do recommend a small meal at first, but then you may proceed to your regular diet.  Drink plenty of fluids but you should avoid alcoholic beverages for 24 hours. ? ?ACTIVITY:  You  should plan to take it easy for the rest of today and you should NOT DRIVE or use heavy machinery until tomorrow (because of the sedation medicines used during the test).   ? ?FOLLOW UP: ?Our staff will call the number listed on your records 48-72 hours following your procedure to check on you and address any questions or concerns that you may have regarding the information given to you following your procedure. If we do not reach you, we will leave a message.  We will attempt to reach you two times.  During this call, we will ask if you have developed any symptoms of COVID 19. If you develop any symptoms (ie: fever, flu-like symptoms, shortness of breath, cough etc.) before then, please call [phone].  If you test positive for Covid 19 in the 2 weeks post procedure, please call and report this information to [place].   ? ?If any biopsies were taken you will be contacted by phone or by letter within the next 1-3 weeks.  Please call us at [phone] if you have not heard about the biopsies in 3 weeks.  ? ? ?SIGNATURES/CONFIDENTIALITY: ?You and/or your care partner have signed paperwork which will be entered into your electronic medical record.  These signatures attest to the fact that that the information above on your After Visit Summary has been reviewed and is understood.  Full responsibility of the confidentiality of this discharge information lies with you and/or your care-partner.  ?

## 2022-01-14 NOTE — Op Note (Signed)
[facility] ?Patient Name: Sarah Benjamin ?Procedure Date: 01/14/2022 1:09 PM ?MRN: [id number] ?Endoscopist: Gerrit Heck , MD ?Age: 51 ?Referring MD:  ?Date of Birth: 11-08-1971 ?Gender: Female ?Account #: [id number] ?Procedure:                Colonoscopy ?Indications:              Screening for colorectal malignant neoplasm, This  ?                          is the patient's first colonoscopy ?Medicines:                Monitored Anesthesia Care ?Procedure:                Pre-Anesthesia Assessment: ?                          - Prior to the procedure, a History and Physical  ?                          was performed, and patient medications and  ?                          allergies were reviewed. The patient's tolerance of  ?                          previous anesthesia was also reviewed. The risks  ?                          and benefits of the procedure and the sedation  ?                          options and risks were discussed with the patient.  ?                          All questions were answered, and informed consent  ?                          was obtained. Prior Anticoagulants: The patient has  ?                          taken no previous anticoagulant or antiplatelet  ?                          agents. ASA Grade Assessment: II - A patient with  ?                          mild systemic disease. After reviewing the risks  ?                          and benefits, the patient was deemed in  ?                          satisfactory condition to undergo the procedure. ?  After obtaining informed consent, the colonoscope  ?                          was passed under direct vision. Throughout the  ?                          procedure, the patient's blood pressure, pulse, and  ?                          oxygen saturations were monitored continuously. The  ?                          Olympus CF-[id number] (#[id number]) Colonoscope was  ?                          introduced through the anus and  advanced to the the  ?                          cecum, identified by appendiceal orifice and  ?                          ileocecal valve. The colonoscopy was performed  ?                          without difficulty. The patient tolerated the  ?                          procedure well. The quality of the bowel  ?                          preparation was excellent. The ileocecal valve,  ?                          appendiceal orifice, and rectum were photographed. ?Scope In: 1:30:27 PM ?Scope Out: 1:45:07 PM ?Scope Withdrawal Time: 0 hours 11 minutes 35 seconds  ?Total Procedure Duration: 0 hours 14 minutes 40 seconds  ?Findings:                 The perianal and digital rectal examinations were  ?                          normal. ?                          A 4 mm polyp was found in the ascending colon. The  ?                          polyp was sessile. The polyp was removed with a  ?                          cold snare. Resection and retrieval were complete.  ?                          Estimated blood loss was minimal. ?  A 2 mm polyp was found in the sigmoid colon. The  ?                          polyp was sessile. The polyp was removed with a  ?                          cold snare. Resection and retrieval were complete.  ?                          Estimated blood loss was minimal. ?                          Multiple small and large-mouthed diverticula were  ?                          found in the sigmoid colon and ascending colon. ?                          The retroflexed view of the distal rectum and anal  ?                          verge was normal and showed no anal or rectal  ?                          abnormalities. ?Complications:            No immediate complications. ?Estimated Blood Loss:     Estimated blood loss was minimal. ?Impression:               - One 4 mm polyp in the ascending colon, removed  ?                          with a cold snare. Resected and retrieved. ?                           - One 2 mm polyp in the sigmoid colon, removed with  ?                          a cold snare. Resected and retrieved. ?                          - Diverticulosis in the sigmoid colon and in the  ?                          ascending colon. ?                          - The distal rectum and anal verge are normal on  ?                          retroflexion view. ?Recommendation:           - Patient has a contact number available for  ?  emergencies. The signs and symptoms of potential  ?                          delayed complications were discussed with the  ?                          patient. Return to normal activities tomorrow.  ?                          Written discharge instructions were provided to the  ?                          patient. ?                          - Resume previous diet. ?                          - Continue present medications. ?                          - Await pathology results. ?                          - Repeat colonoscopy for surveillance based on  ?                          pathology results. ?                          - Return to GI office PRN. ?Gerrit Heck, MD ?01/14/2022 1:49:34 PM ?

## 2022-01-18 ENCOUNTER — Telehealth: Payer: Self-pay | Admitting: *Deleted

## 2022-01-18 NOTE — Telephone Encounter (Signed)
?  Follow up Call- ? ?Call back number 01/14/2022  ?Post procedure Call Back phone  # [phone]  ?Permission to leave phone message Yes  ?Some recent data might be hidden  ?  ? ?Patient questions: ? ?Do you have a fever, pain , or abdominal swelling? No. ?Pain Score  0 * ? ?Have you tolerated food without any problems? Yes.   ? ?Have you been able to return to your normal activities? Yes.   ? ?Do you have any questions about your discharge instructions: ?Diet   No. ?Medications  No. ?Follow up visit  No. ? ?Do you have questions or concerns about your Care? Yes.  -pt states her right arm was swollen and bruised from an unsuccessful IV stick. States she woke up Saturday with it bruised and purple and red in color with a white spot in the middle. Stated it hurt like a burn and there was a hard knot. States it is getting better and the knot is smaller. RN asked if any fluids were run through that site and she stated that they were not. RN encouraged pt to continue to monitor the site for improvement and to look for signs of infection. Instructed pt to seek care with her PCP or an Urgent Care if she felt the site was getting worse. Pt verbalized understanding.  ? ?Actions: ?* If pain score is 4 or above: ?Physician/ provider Notified : Gerrit Heck, DO. ?Date 01/18/2022 ?7:40 AM ?  ?

## 2022-01-18 NOTE — Telephone Encounter (Signed)
I agree with advice as outlined. ?

## 2022-01-24 ENCOUNTER — Encounter: Payer: Self-pay | Admitting: Gastroenterology

## 2022-01-26 ENCOUNTER — Ambulatory Visit: Payer: BC Managed Care – PPO | Admitting: Sports Medicine

## 2022-03-04 ENCOUNTER — Encounter: Payer: Self-pay | Admitting: Family Medicine

## 2022-03-04 ENCOUNTER — Ambulatory Visit: Payer: BC Managed Care – PPO | Admitting: Family Medicine

## 2022-03-04 ENCOUNTER — Ambulatory Visit (INDEPENDENT_AMBULATORY_CARE_PROVIDER_SITE_OTHER): Payer: BC Managed Care – PPO

## 2022-03-04 VITALS — BP 123/75 | HR 68 | Ht 62.0 in | Wt 213.0 lb

## 2022-03-04 DIAGNOSIS — M79641 Pain in right hand: Secondary | ICD-10-CM | POA: Insufficient documentation

## 2022-03-04 DIAGNOSIS — M79642 Pain in left hand: Secondary | ICD-10-CM | POA: Diagnosis not present

## 2022-03-04 DIAGNOSIS — M545 Low back pain, unspecified: Secondary | ICD-10-CM | POA: Diagnosis not present

## 2022-03-04 DIAGNOSIS — M5416 Radiculopathy, lumbar region: Secondary | ICD-10-CM | POA: Diagnosis not present

## 2022-03-04 DIAGNOSIS — M2560 Stiffness of unspecified joint, not elsewhere classified: Secondary | ICD-10-CM | POA: Diagnosis not present

## 2022-03-04 MED ORDER — GABAPENTIN 300 MG PO CAPS: ORAL_CAPSULE | ORAL | 3 refills | Status: DC

## 2022-03-04 MED ORDER — PREDNISONE 50 MG PO TABS: ORAL_TABLET | ORAL | 0 refills | Status: DC

## 2022-03-04 NOTE — Patient Instructions (Signed)
Start prednisone '50mg'$  daily x5 days.  ?Try gabapentin '300mg'$  nightly for the first week then you may increase to three times per day if needed.  ?We'll be in touch with lab and xray results.  ?

## 2022-03-04 NOTE — Progress Notes (Signed)
?Sarah Benjamin - 51 y.o. female MRN [id number]  Date of birth: 1971/07/11 ? ?Subjective ?No chief complaint on file. ? ? ?HPI ?Sarah Benjamin is a 51 y.o. female here today with complaint of joint pain.  She has had pain in her lower back with radiation into left lower extremity.  She does have some swelling in the L leg at times, especially towards the end of the day.  She had previous US to evaluate for DVT in 06/2021 which was negative.  She did try PT but was unable to continue due to cost. She has home exercises but hasn't been doing these regularly.  Steroids did provide some relief previously.   ? ?She also reports having some swelling and pain in bilateral hands.  Has morning stiffness that improves throughout the day.  Denies numbness or tingling in the hands.  ? ?ROS:  A comprehensive ROS was completed and negative except as noted per HPI ? ?No Known Allergies ? ?Past Medical History:  ?Diagnosis Date  ? Anemia   ? Thyroid disease   ? ? ?Past Surgical History:  ?Procedure Laterality Date  ? WISDOM TOOTH EXTRACTION    ? ? ?Social History  ? ?Socioeconomic History  ? Marital status: Divorced  ?  Spouse name: Not on file  ? Number of children: Not on file  ? Years of education: Not on file  ? Highest education level: Not on file  ?Occupational History  ? Not on file  ?Tobacco Use  ? Smoking status: Never  ? Smokeless tobacco: Never  ?Vaping Use  ? Vaping Use: Never used  ?Substance and Sexual Activity  ? Alcohol use: No  ? Drug use: No  ? Sexual activity: Not on file  ?Other Topics Concern  ? Not on file  ?Social History Narrative  ? Not on file  ? ?Social Determinants of Health  ? ?Financial Resource Strain: Not on file  ?Food Insecurity: Not on file  ?Transportation Needs: Not on file  ?Physical Activity: Not on file  ?Stress: Not on file  ?Social Connections: Not on file  ? ? ?Family History  ?Problem Relation Age of Onset  ? Diabetes Mother   ? Diabetes Brother   ? Colon cancer Neg Hx   ? Colon polyps Neg  Hx   ? Esophageal cancer Neg Hx   ? Rectal cancer Neg Hx   ? Stomach cancer Neg Hx   ? ? ?Health Maintenance  ?Topic Date Due  ? HIV Screening  Never done  ? Hepatitis C Screening  Never done  ? COVID-19 Vaccine (4 - Booster for Moderna series) 03/02/2021  ? INFLUENZA VACCINE  06/14/2022  ? PAP SMEAR-Modifier  02/04/2023  ? MAMMOGRAM  10/07/2023  ? COLONOSCOPY (Pts 45-8yrs Insurance coverage will need to be confirmed)  01/15/2027  ? TETANUS/TDAP  07/26/2028  ? HPV VACCINES  Aged Out  ? ? ? ?----------------------------------------------------------------------------------------------------------------------------------------------------------------------------------------------------------------- ?Physical Exam ?BP 123/75 (BP Location: Left Arm, Patient Position: Sitting, Cuff Size: Large)   Pulse 68   Ht $R'5\' 2"'gP$  (1.575 m)   Wt 213 lb (96.6 kg)   SpO2 96%   BMI 38.96 kg/m?  ? ?Physical Exam ?Constitutional:   ?   Appearance: Normal appearance.  ?Eyes:  ?   General: No scleral icterus. ?Musculoskeletal:  ?   Cervical back: Neck supple.  ?   Comments: No joint effusions noted in hands.  Mild pain with palpation of joints.  ?  ?Neurological:  ?   General:  No focal deficit present.  ?   Mental Status: She is alert.  ?Psychiatric:     ?   Mood and Affect: Mood normal.     ?   Behavior: Behavior normal.  ? ? ?------------------------------------------------------------------------------------------------------------------------------------------------------------------------------------------------------------------- ?Assessment and Plan ? ?Lumbar back pain with radiculopathy affecting lower extremity ?She has tried therapy previously but not consistent with this.  Encouraged to add this back on.  Will add prednisone 44m daily x5 days.  Xrays ordered.   ? ?Bilateral hand pain ?Morning stiffness and bilateral nature concerning for inflammatory cause.  Checking ESR, CRP, [place]/RF/Uric acid.   ? ? ?No orders of the  defined types were placed in this encounter. ? ? ?No follow-ups on file. ? ? ? ?This visit occurred during the SARS-CoV-2 public health emergency.  Safety protocols were in place, including screening questions prior to the visit, additional usage of staff PPE, and extensive cleaning of exam room while observing appropriate contact time as indicated for disinfecting solutions.  ? ?

## 2022-03-04 NOTE — Assessment & Plan Note (Signed)
She has tried therapy previously but not consistent with this.  Encouraged to add this back on.  Will add prednisone '50mg'$  daily x5 days.  Xrays ordered.   ?

## 2022-03-04 NOTE — Assessment & Plan Note (Signed)
Morning stiffness and bilateral nature concerning for inflammatory cause.  Checking ESR, CRP, [place]/RF/Uric acid.   ?

## 2022-03-05 ENCOUNTER — Encounter: Payer: Self-pay | Admitting: Family Medicine

## 2022-03-07 LAB — ANA,IFA RA DIAG PNL W/RFLX TIT/PATN
Anti Nuclear Antibody (ANA): NEGATIVE
Cyclic Citrullin Peptide Ab: <16 UNITS
Rheumatoid fact SerPl-aCnc: <14 IU/mL (ref <14)

## 2022-03-07 LAB — C-REACTIVE PROTEIN: CRP: 7.9 mg/L (ref <8.0)

## 2022-03-07 LAB — URIC ACID: Uric Acid, Serum: 6.6 mg/dL (ref 2.5–7.0)

## 2022-03-07 LAB — SEDIMENTATION RATE: Sed Rate: 2 mm/h (ref 0–20)

## 2022-03-08 ENCOUNTER — Telehealth: Payer: Self-pay

## 2022-03-08 NOTE — Telephone Encounter (Signed)
LVM advising pt that Dr. Zigmund Daniel sent result note via [facility]. Also, repeated the message sent. Callback information was provided.  ?

## 2022-07-07 ENCOUNTER — Other Ambulatory Visit: Payer: Self-pay | Admitting: Family Medicine

## 2022-08-30 ENCOUNTER — Telehealth: Payer: Self-pay | Admitting: Family Medicine

## 2022-08-30 NOTE — Telephone Encounter (Signed)
Recommend appt, if no appt's are available needs to go to urgent care ASAP

## 2022-08-30 NOTE — Telephone Encounter (Signed)
Patient states right index finger is numbness and tingling and cold and can't move it and left lower leg is bruised

## 2022-08-31 ENCOUNTER — Encounter: Payer: Self-pay | Admitting: Family Medicine

## 2022-08-31 ENCOUNTER — Ambulatory Visit (INDEPENDENT_AMBULATORY_CARE_PROVIDER_SITE_OTHER): Payer: BC Managed Care – PPO

## 2022-08-31 ENCOUNTER — Ambulatory Visit: Payer: BC Managed Care – PPO | Admitting: Family Medicine

## 2022-08-31 VITALS — BP 135/89 | HR 61 | Ht 62.0 in | Wt 213.0 lb

## 2022-08-31 DIAGNOSIS — J019 Acute sinusitis, unspecified: Secondary | ICD-10-CM

## 2022-08-31 DIAGNOSIS — R200 Anesthesia of skin: Secondary | ICD-10-CM

## 2022-08-31 DIAGNOSIS — M5412 Radiculopathy, cervical region: Secondary | ICD-10-CM

## 2022-08-31 DIAGNOSIS — J4531 Mild persistent asthma with (acute) exacerbation: Secondary | ICD-10-CM

## 2022-08-31 DIAGNOSIS — M47812 Spondylosis without myelopathy or radiculopathy, cervical region: Secondary | ICD-10-CM | POA: Diagnosis not present

## 2022-08-31 DIAGNOSIS — B9689 Other specified bacterial agents as the cause of diseases classified elsewhere: Secondary | ICD-10-CM | POA: Insufficient documentation

## 2022-08-31 DIAGNOSIS — I10 Essential (primary) hypertension: Secondary | ICD-10-CM | POA: Diagnosis not present

## 2022-08-31 MED ORDER — AMOXICILLIN-POT CLAVULANATE 875-125 MG PO TABS: 1.0000 | ORAL_TABLET | Freq: Two times a day (BID) | ORAL | 0 refills | Status: DC

## 2022-08-31 MED ORDER — EPINEPHRINE 0.3 MG/0.3ML IJ SOAJ: 0.3000 mg | INTRAMUSCULAR | 2 refills | Status: DC | PRN

## 2022-08-31 MED ORDER — LOSARTAN POTASSIUM-HCTZ 50-12.5 MG PO TABS: 1.0000 | ORAL_TABLET | Freq: Every day | ORAL | 3 refills | Status: DC

## 2022-08-31 MED ORDER — ALBUTEROL SULFATE HFA 108 (90 BASE) MCG/ACT IN AERS: 2.0000 | INHALATION_SPRAY | RESPIRATORY_TRACT | 1 refills | Status: DC | PRN

## 2022-08-31 MED ORDER — FLUTICASONE PROPIONATE 50 MCG/ACT NA SUSP: 2.0000 | Freq: Every day | NASAL | 6 refills | Status: DC

## 2022-08-31 NOTE — Assessment & Plan Note (Signed)
-   symptoms present for 6 days but patient says that she thought she was getting better and then go progressively worse. Due to progression of symptoms will go ahead and treat as bacterial with augmentin. Did recommend nasal spray and taught her cross treatment for proper use. Recommended she continue her allegra.

## 2022-08-31 NOTE — Progress Notes (Signed)
Acute Office Visit  Subjective:     Patient ID: [name], female    DOB: July 19, 1971, 51 y.o.   MRN: [id number]  Chief Complaint  Patient presents with   Sinusitis   Numbness    HPI Patient is in today for left index finger numbness. She contacted our office yesterday for her index finger being numb, cold, and immobile. Pt was recommended to go to Urgent care. However she did not go to urgent care. Says the pain is so severe it woke her up at night. She admits to working with her hands throughout the day.   Sinus pain Pt says she has been experiencing sinus pain and pressure along with headaches for 6 days. She thought she was getting better at one point but then yesterday got worse. She was treating herself with OTC medication. She has been using her allegra.   Review of Systems  Constitutional:  Negative for chills and fever.  HENT:  Positive for congestion and sinus pain.   Respiratory:  Negative for cough and shortness of breath.   Cardiovascular:  Negative for chest pain.  Musculoskeletal:        Index finger pain  Neurological:  Negative for headaches.        Objective:    BP 135/89   Pulse 61   Ht '5\' 2"'$  (1.575 m)   Wt 213 lb (96.6 kg)   BMI 38.96 kg/m    Physical Exam Vitals and nursing note reviewed.  Constitutional:      General: She is not in acute distress.    Appearance: Normal appearance.  HENT:     Head: Normocephalic and atraumatic.     Comments: Tenderness to palpation of maxillary and frontal sinuses bilaterally Erythema of nasal passages    Right Ear: Tympanic membrane, ear canal and external ear normal.     Left Ear: Tympanic membrane, ear canal and external ear normal.     Nose: Congestion present.     Mouth/Throat:     Pharynx: Posterior oropharyngeal erythema present.  Eyes:     Conjunctiva/sclera: Conjunctivae normal.  Cardiovascular:     Rate and Rhythm: Normal rate.     Heart sounds:     Friction rub present.  Pulmonary:      Effort: Pulmonary effort is normal.  Musculoskeletal:     Comments: Positive tinel sign of right hand Good peripheral pulse on right hand, extremity is warm and flesh colored Positive phalen test - tenderness to palpation of index finger joint    Neurological:     General: No focal deficit present.     Mental Status: She is alert and oriented to person, place, and time.  Psychiatric:        Mood and Affect: Mood normal.        Behavior: Behavior normal.        Thought Content: Thought content normal.        Judgment: Judgment normal.     No results found for any visits on 08/31/22.      Assessment & Plan:   Problem List Items Addressed This Visit       Cardiovascular and Mediastinum   Essential hypertension   Relevant Medications   EPINEPHrine 0.3 mg/0.3 mL IJ SOAJ injection   losartan-hydrochlorothiazide (HYZAAR) 50-12.5 MG tablet     Respiratory   Acute bacterial sinusitis - Primary    - symptoms present for 6 days but patient says that she thought she was getting  better and then go progressively worse. Due to progression of symptoms will go ahead and treat as bacterial with augmentin. Did recommend nasal spray and taught her cross treatment for proper use. Recommended she continue her allegra.       Relevant Medications   fluticasone (FLONASE) 50 MCG/ACT nasal spray   amoxicillin-clavulanate (AUGMENTIN) 875-125 MG tablet     Nervous and Auditory   Cervical radiculopathy   Relevant Orders   DG Cervical Spine Complete     Other   Finger numbness    - believe this to be carpal tunnel since she had a positive tinel sign on the right hand. Recommended night splint.  - because pain woke her up from sleep will get an xray to look for any underlying pathology.      Relevant Orders   DG Hand Complete Right   Other Visit Diagnoses     Mild persistent asthma with acute exacerbation       Relevant Medications   albuterol (VENTOLIN HFA) 108 (90 Base) MCG/ACT inhaler        Meds ordered this encounter  Medications   albuterol (VENTOLIN HFA) 108 (90 Base) MCG/ACT inhaler    Sig: Inhale 2 puffs into the lungs every 4 (four) hours as needed for wheezing or shortness of breath.    Dispense:  2 each    Refill:  1   EPINEPHrine 0.3 mg/0.3 mL IJ SOAJ injection    Sig: Inject 0.3 mg into the skin as needed for anaphylaxis.    Dispense:  1 each    Refill:  2   losartan-hydrochlorothiazide (HYZAAR) 50-12.5 MG tablet    Sig: Take 1 tablet by mouth daily.    Dispense:  90 tablet    Refill:  3   fluticasone (FLONASE) 50 MCG/ACT nasal spray    Sig: Place 2 sprays into both nostrils daily.    Dispense:  16 g    Refill:  6   amoxicillin-clavulanate (AUGMENTIN) 875-125 MG tablet    Sig: Take 1 tablet by mouth 2 (two) times daily.    Dispense:  20 tablet    Refill:  0    Return in about 3 weeks (around 09/21/2022) for with Dr. Zigmund Daniel.  Owens Loffler, DO

## 2022-08-31 NOTE — Assessment & Plan Note (Addendum)
-   believe this to be carpal tunnel since she had a positive tinel sign on the right hand. Recommended night splint.  - because pain woke her up from sleep will get an xray to look for any underlying pathology.

## 2022-09-02 ENCOUNTER — Encounter: Payer: Self-pay | Admitting: Family Medicine

## 2022-09-02 ENCOUNTER — Telehealth: Payer: Self-pay | Admitting: Family Medicine

## 2022-09-02 NOTE — Telephone Encounter (Signed)
Contacted the patient regarding the X-ray results. Patient informed that results have not been interpret by Radiologist. Patient notified that medical assistant will call once the information is available. No other inquires during the call.

## 2022-09-02 NOTE — Telephone Encounter (Signed)
Patient called left voicemail concerning results of x-rays and also still having a lot of pain in her hand.  She would like a call back # [phone].

## 2022-09-05 ENCOUNTER — Other Ambulatory Visit: Payer: Self-pay | Admitting: Family Medicine

## 2022-09-05 ENCOUNTER — Other Ambulatory Visit: Payer: Self-pay

## 2022-09-05 DIAGNOSIS — R200 Anesthesia of skin: Secondary | ICD-10-CM

## 2022-09-05 DIAGNOSIS — R202 Paresthesia of skin: Secondary | ICD-10-CM

## 2022-09-05 NOTE — Telephone Encounter (Signed)
Per provider's request - contacted patient regarding the pain in her Right hand. Per patient, she is not using a night splint. Patient informed to buy a hand brace to reduce pain in her hand. Agreeable with plan. She received the message from provider regarding the most recent X-ray results. Patient mentioned that pain has increased. She has been taking Tylenol every 6 hours or so to help with controlling her pain. Patient is aware of the Neurology referral. She was informed to notify the clinic within a week if she doesn't get an appointment scheduled with the specialist.

## 2022-09-16 ENCOUNTER — Ambulatory Visit (INDEPENDENT_AMBULATORY_CARE_PROVIDER_SITE_OTHER): Payer: BC Managed Care – PPO | Admitting: Physician Assistant

## 2022-09-16 ENCOUNTER — Encounter: Payer: Self-pay | Admitting: Physician Assistant

## 2022-09-16 VITALS — BP 150/90 | HR 59 | Temp 98.1°F | Ht 62.0 in | Wt 211.0 lb

## 2022-09-16 DIAGNOSIS — R051 Acute cough: Secondary | ICD-10-CM | POA: Diagnosis not present

## 2022-09-16 DIAGNOSIS — J0140 Acute pansinusitis, unspecified: Secondary | ICD-10-CM

## 2022-09-16 MED ORDER — PREDNISONE 20 MG PO TABS: 20.0000 mg | ORAL_TABLET | Freq: Two times a day (BID) | ORAL | 0 refills | Status: DC

## 2022-09-16 NOTE — Patient Instructions (Signed)
Start prednisone twice a day for 5 days.  Pick up albuterol at pharmacy Make sure using flonase nasal spray.

## 2022-09-16 NOTE — Progress Notes (Unsigned)
Acute Office Visit  Subjective:     Patient ID: Sarah Benjamin, female    DOB: 06/08/1971, 51 y.o.   MRN: [id number]  Chief Complaint  Patient presents with   Sinus Problem    HPI Patient is in today for follow up on sinus infection. She was treated with augmentin. She felt some better but has had persistent sinus pressure, clear nasal discharge, non-productive cough. No fever, chills, body aches. She does have seasonal allergies. No trouble breathing but has had some chest tightness.   .. Active Ambulatory Problems    Diagnosis Date Noted   PFO (patent foramen ovale) 01/12/2016   Pain in the chest 01/12/2016   Dizziness 04/19/2016   Hypothyroid 01/13/2014   Seasonal allergies 04/19/2016   Sinus headache 04/19/2016   History of Bell's palsy 04/19/2016   Mild persistent asthma without complication 69/67/8938   Lumbar spondylosis 04/17/2015   Constipation 01/13/2014   Essential hypertension 02/12/2018   Hip pain 04/17/2015   Irregular menses 03/30/2017   Migraine 01/13/2014   Numbness and tingling of left side of face 02/12/2018   Prediabetes 06/21/2017   BPPV (benign paroxysmal positional vertigo), right 06/04/2018   Allergic rhinitis 06/12/2018   Transaminitis 06/12/2018   Arthralgia of metacarpophalangeal joint, left third 09/18/2018   Stress fracture of metatarsal bone of left foot 07/08/2020   Impingement syndrome, shoulder, right 09/29/2021   Well adult exam 10/05/2021   Bilateral hand pain 03/04/2022   Lumbar back pain with radiculopathy affecting lower extremity 03/04/2022   Finger numbness 08/31/2022   Cervical radiculopathy 08/31/2022   Acute bacterial sinusitis 08/31/2022   Resolved Ambulatory Problems    Diagnosis Date Noted   Hypothyroidism 02/08/2012   Past Medical History:  Diagnosis Date   Anemia    Thyroid disease      ROS  See HPI.     Objective:    BP (!) 150/90   Pulse (!) 59   Temp 98.1 F (36.7 C) (Oral)   Ht '5\' 2"'$  (1.575 m)    Wt 211 lb (95.7 kg)   SpO2 97%   BMI 38.59 kg/m  BP Readings from Last 3 Encounters:  09/16/22 (!) 150/90  08/31/22 135/89  03/04/22 123/75   Wt Readings from Last 3 Encounters:  09/16/22 211 lb (95.7 kg)  08/31/22 213 lb (96.6 kg)  03/04/22 213 lb (96.6 kg)      Physical Exam Constitutional:      Appearance: Normal appearance.  HENT:     Head: Normocephalic.     Comments: Pressure over maxillary sinuses    Right Ear: Tympanic membrane, ear canal and external ear normal. There is no impacted cerumen.     Left Ear: Tympanic membrane, ear canal and external ear normal. There is no impacted cerumen.     Nose: Congestion present.     Mouth/Throat:     Mouth: Mucous membranes are moist.     Pharynx: Posterior oropharyngeal erythema present.  Eyes:     Conjunctiva/sclera: Conjunctivae normal.  Cardiovascular:     Rate and Rhythm: Normal rate and regular rhythm.     Pulses: Normal pulses.     Heart sounds: Normal heart sounds.  Pulmonary:     Effort: Pulmonary effort is normal.     Breath sounds: Normal breath sounds.  Musculoskeletal:     Right lower leg: No edema.     Left lower leg: No edema.  Neurological:     General: No focal deficit present.  Mental Status: She is alert and oriented to person, place, and time.          Assessment & Plan:  Marland KitchenMarland KitchenAna was seen today for sinus problem.  Diagnoses and all orders for this visit:  Acute non-recurrent pansinusitis -     predniSONE (DELTASONE) 20 MG tablet; Take 1 tablet (20 mg total) by mouth 2 (two) times daily with a meal.  Acute cough -     predniSONE (DELTASONE) 20 MG tablet; Take 1 tablet (20 mg total) by mouth 2 (two) times daily with a meal.   No signs of persistent bacterial infection.  I see more signs of inflammation Start prednisone taper, let me know if symptoms not improving or worsening.  Make sure using flonase nasal spray.  Use albuterol if needed.     Iran Planas, PA-C

## 2022-09-22 ENCOUNTER — Encounter: Payer: Self-pay | Admitting: Family Medicine

## 2022-09-22 ENCOUNTER — Ambulatory Visit: Payer: BC Managed Care – PPO | Admitting: Family Medicine

## 2022-09-22 VITALS — BP 115/73 | HR 66 | Ht 62.0 in | Wt 213.0 lb

## 2022-09-22 DIAGNOSIS — B9689 Other specified bacterial agents as the cause of diseases classified elsewhere: Secondary | ICD-10-CM | POA: Diagnosis not present

## 2022-09-22 DIAGNOSIS — I10 Essential (primary) hypertension: Secondary | ICD-10-CM | POA: Diagnosis not present

## 2022-09-22 DIAGNOSIS — J019 Acute sinusitis, unspecified: Secondary | ICD-10-CM

## 2022-09-22 DIAGNOSIS — R200 Anesthesia of skin: Secondary | ICD-10-CM

## 2022-09-22 MED ORDER — EPINEPHRINE 0.3 MG/0.3ML IJ SOAJ: 0.3000 mg | INTRAMUSCULAR | 0 refills | Status: DC | PRN

## 2022-09-22 MED ORDER — FLUTICASONE PROPIONATE 50 MCG/ACT NA SUSP: 2.0000 | Freq: Every day | NASAL | 6 refills | Status: DC

## 2022-09-22 NOTE — Patient Instructions (Signed)
Start flonase daily for continued congestion.

## 2022-09-25 NOTE — Assessment & Plan Note (Signed)
This is pretty much resolved at this point.  Think she does have some allergies that could be contributing to some of her residual symptoms.  Adding Flonase for continued congestion.

## 2022-09-25 NOTE — Assessment & Plan Note (Signed)
Blood pressures well controlled at this time.  Recommend continuation of current medication for management of hypertension.

## 2022-09-25 NOTE — Assessment & Plan Note (Signed)
She is referred for nerve conduction study.

## 2022-09-25 NOTE — Progress Notes (Signed)
Sarah Benjamin - 51 y.o. female MRN [id number]  Date of birth: 02-Jan-1971  Subjective Chief Complaint  Patient presents with   Nasal Congestion    HPI Sarah Benjamin is a 51 year old female here today for follow-up visit.  Recently seen by both Dr. Mel Almond and Luvenia Starch in our clinic for sinusitis.  Treated with antibiotics and steroids.  She currently feels like she is getting improvement of this.  She does still have some fullness in her ears and continued congestion.  She denies any sinus pain, fever, chills or purulent drainage.    She has had trouble filling her EpiPen due to cost.  Blood pressure well controlled at this time losartan/hydrochlorothiazide.  Denies symptoms related to hypertension including chest pain, shortness of breath, palpitations, headaches or vision changes.  Dealing with possible carpal tunnel of the right hand.  Has upcoming EMG.  ROS:  A comprehensive ROS was completed and negative except as noted per HPI  No Known Allergies   Past Medical History:  Diagnosis Date   Anemia    Thyroid disease     Past Surgical History:  Procedure Laterality Date   WISDOM TOOTH EXTRACTION      Social History   Socioeconomic History   Marital status: Divorced    Spouse name: Not on file   Number of children: Not on file   Years of education: Not on file   Highest education level: Not on file  Occupational History   Not on file  Tobacco Use   Smoking status: Never   Smokeless tobacco: Never  Vaping Use   Vaping Use: Never used  Substance and Sexual Activity   Alcohol use: No   Drug use: No   Sexual activity: Not on file  Other Topics Concern   Not on file  Social History Narrative   Not on file   Social Determinants of Health   Financial Resource Strain: Not on file  Food Insecurity: Not on file  Transportation Needs: Not on file  Physical Activity: Not on file  Stress: Not on file  Social Connections: Not on file    Family History  Problem Relation Age of  Onset   Diabetes Mother    Diabetes Brother    Colon cancer Neg Hx    Colon polyps Neg Hx    Esophageal cancer Neg Hx    Rectal cancer Neg Hx    Stomach cancer Neg Hx     Health Maintenance  Topic Date Due   COVID-19 Vaccine (4 - Moderna series) 12/15/2022 (Originally 03/02/2021)   INFLUENZA VACCINE  02/12/2023 (Originally 06/14/2022)   Hepatitis C Screening  03/05/2023 (Originally 10/15/1990)   HIV Screening  03/05/2023 (Originally 10/16/1987)   PAP SMEAR-Modifier  02/04/2023   MAMMOGRAM  10/07/2023   COLONOSCOPY (Pts 45-25yr Insurance coverage will need to be confirmed)  01/15/2027   TETANUS/TDAP  07/26/2028   HPV VACCINES  Aged Out     ----------------------------------------------------------------------------------------------------------------------------------------------------------------------------------------------------------------- Physical Exam BP 115/73 (BP Location: Left Arm, Patient Position: Sitting, Cuff Size: Large)   Pulse 66   Ht '5\' 2"'$  (1.575 m)   Wt 213 lb (96.6 kg)   SpO2 97%   BMI 38.96 kg/m   Physical Exam Constitutional:      Appearance: Normal appearance.  HENT:     Head: Normocephalic and atraumatic.  Eyes:     General: No scleral icterus. Cardiovascular:     Rate and Rhythm: Normal rate and regular rhythm.  Pulmonary:     Effort: Pulmonary effort  is normal.     Breath sounds: Normal breath sounds.  Musculoskeletal:     Cervical back: Neck supple.  Neurological:     Mental Status: She is alert.  Psychiatric:        Mood and Affect: Mood normal.        Behavior: Behavior normal.     ------------------------------------------------------------------------------------------------------------------------------------------------------------------------------------------------------------------- Assessment and Plan  Essential hypertension Blood pressures well controlled at this time.  Recommend continuation of current medication for  management of hypertension.  Acute bacterial sinusitis This is pretty much resolved at this point.  Think she does have some allergies that could be contributing to some of her residual symptoms.  Adding Flonase for continued congestion.  Finger numbness She is referred for nerve conduction study.   Meds ordered this encounter  Medications   fluticasone (FLONASE) 50 MCG/ACT nasal spray    Sig: Place 2 sprays into both nostrils daily.    Dispense:  16 g    Refill:  6   EPINEPHrine (EPIPEN 2-PAK) 0.3 mg/0.3 mL IJ SOAJ injection    Sig: Inject 0.3 mg into the muscle as needed for anaphylaxis.    Dispense:  1 each    Refill:  0    No follow-ups on file.    This visit occurred during the SARS-CoV-2 public health emergency.  Safety protocols were in place, including screening questions prior to the visit, additional usage of staff PPE, and extensive cleaning of exam room while observing appropriate contact time as indicated for disinfecting solutions.

## 2022-09-29 ENCOUNTER — Other Ambulatory Visit: Payer: Self-pay | Admitting: Family Medicine

## 2022-09-29 DIAGNOSIS — Z1231 Encounter for screening mammogram for malignant neoplasm of breast: Secondary | ICD-10-CM

## 2022-10-11 ENCOUNTER — Ambulatory Visit (INDEPENDENT_AMBULATORY_CARE_PROVIDER_SITE_OTHER): Payer: BC Managed Care – PPO | Admitting: Neurology

## 2022-10-11 ENCOUNTER — Encounter: Payer: Self-pay | Admitting: Family Medicine

## 2022-10-11 DIAGNOSIS — R202 Paresthesia of skin: Secondary | ICD-10-CM | POA: Diagnosis not present

## 2022-10-11 DIAGNOSIS — G5601 Carpal tunnel syndrome, right upper limb: Secondary | ICD-10-CM

## 2022-10-11 NOTE — Procedures (Signed)
[facility]  [address] [place]  [place], [place] [address] Tel: [phone] Fax:  [phone] Test Date:  10/11/2022  Patient: Sarah Benjamin DOB: [birth year] Physician: Narda Amber, DO  Sex: Female Height: '5\' 2"'$  Ref Phys: Elmo Putt, DO  ID#: [id number]   Technician:    Patient Complaints: This is a 51 year old female referred for evaluation of right hand paresthesias and pain.  NCV & EMG Findings: Extensive electrodiagnostic testing of the right upper extremity shows:  Right median sensory response shows prolonged latency (5.4 ms) and reduced amplitude (8.8 V).  Right ulnar sensory response is within normal limits. Right median motor response shows prolonged latency (5.8 ms).  Right ulnar motor responses within normal limits.   There is no evidence of active or chronic motor axonal loss changes affecting any of the tested muscles.  Motor unit configuration and recruitment pattern is within normal limits.    Impression: Right median neuropathy at or distal to the wrist, consistent with a clinical diagnosis of carpal tunnel syndrome.  Overall, these findings are moderate-to-severe in degree electrically.   ___________________________ Narda Amber, DO    Nerve Conduction Studies Anti Sensory Summary Table   Stim Site NR Peak (ms) Norm Peak (ms) O-P Amp (V) Norm O-P Amp  Right Median Anti Sensory (2nd Digit)  35C  Wrist    5.4 <3.4 8.8 >20  Right Ulnar Anti Sensory (5th Digit)  35C  Wrist    2.3 <3.1 36.7 >12   Motor Summary Table   Stim Site NR Onset (ms) Norm Onset (ms) O-P Amp (mV) Norm O-P Amp Site1 Site2 Delta-0 (ms) Dist (cm) Vel (m/s) Norm Vel (m/s)  Right Median Motor (Abd Poll Brev)  35C  Wrist    5.8 <3.9 6.9 >6 Elbow Wrist 5.0 28.0 56 >50  Elbow    10.8  6.5         Right Ulnar Motor (Abd Dig Minimi)  35C  Wrist    2.2 <3.1 11.3 >7 B Elbow Wrist 3.5 22.0 63 >50  B Elbow    5.7  11.3  A Elbow B Elbow 1.6 10.0 63 >50  A Elbow    7.3   11.2          EMG   Side Muscle Ins Act Fibs Fasc Recrt Dur. Amp. Poly. Activation Comment  Right 1stDorInt Nml Nml Nml Nml Nml Nml Nml Nml N/A  Right Abd Poll Brev Nml Nml Nml Nml Nml Nml Nml Nml N/A  Right PronatorTeres Nml Nml Nml Nml Nml Nml Nml Nml N/A  Right Biceps Nml Nml Nml Nml Nml Nml Nml Nml N/A  Right Triceps Nml Nml Nml Nml Nml Nml Nml Nml N/A  Right Deltoid Nml Nml Nml Nml Nml Nml Nml Nml N/A      Waveforms:

## 2022-10-12 ENCOUNTER — Encounter: Payer: Self-pay | Admitting: Family Medicine

## 2022-10-12 ENCOUNTER — Ambulatory Visit (INDEPENDENT_AMBULATORY_CARE_PROVIDER_SITE_OTHER): Payer: BC Managed Care – PPO

## 2022-10-12 ENCOUNTER — Ambulatory Visit (INDEPENDENT_AMBULATORY_CARE_PROVIDER_SITE_OTHER): Payer: BC Managed Care – PPO | Admitting: Family Medicine

## 2022-10-12 VITALS — BP 119/76 | HR 63 | Ht 62.0 in | Wt 209.0 lb

## 2022-10-12 DIAGNOSIS — Z1231 Encounter for screening mammogram for malignant neoplasm of breast: Secondary | ICD-10-CM

## 2022-10-12 DIAGNOSIS — Z13220 Encounter for screening for lipoid disorders: Secondary | ICD-10-CM | POA: Diagnosis not present

## 2022-10-12 DIAGNOSIS — Z0000 Encounter for general adult medical examination without abnormal findings: Secondary | ICD-10-CM

## 2022-10-12 DIAGNOSIS — I10 Essential (primary) hypertension: Secondary | ICD-10-CM

## 2022-10-12 DIAGNOSIS — E039 Hypothyroidism, unspecified: Secondary | ICD-10-CM | POA: Diagnosis not present

## 2022-10-12 NOTE — Progress Notes (Signed)
Sarah Benjamin - 51 y.o. female MRN [id number]  Date of birth: December 05, 1970  Subjective Chief Complaint  Patient presents with   Annual Exam    HPI Sarah Benjamin is a 51 y.o. female here today for annual exam.  She reports she is doing pretty well.  She did have recent nerve conduction study for finger numbness in her right hand.  She is staying fairly active but admits that this could be better.  She is trying to follow a pretty healthy diet.  She is a non-smoker.  Rare alcohol use.  Mammogram up-to-date.  She has upcoming appointment with GYN.  Review of Systems  Constitutional:  Negative for chills, fever, malaise/fatigue and weight loss.  HENT:  Negative for congestion, ear pain and sore throat.   Eyes:  Negative for blurred vision, double vision and pain.  Respiratory:  Negative for cough and shortness of breath.   Cardiovascular:  Negative for chest pain and palpitations.  Gastrointestinal:  Negative for abdominal pain, blood in stool, constipation, heartburn and nausea.  Genitourinary:  Negative for dysuria and urgency.  Musculoskeletal:  Negative for joint pain and myalgias.  Neurological:  Negative for dizziness and headaches.  Endo/Heme/Allergies:  Does not bruise/bleed easily.  Psychiatric/Behavioral:  Negative for depression. The patient is not nervous/anxious and does not have insomnia.     No Known Allergies  Past Medical History:  Diagnosis Date   Anemia    Thyroid disease     Past Surgical History:  Procedure Laterality Date   WISDOM TOOTH EXTRACTION      Social History   Socioeconomic History   Marital status: Divorced    Spouse name: Not on file   Number of children: Not on file   Years of education: Not on file   Highest education level: Not on file  Occupational History   Not on file  Tobacco Use   Smoking status: Never   Smokeless tobacco: Never  Vaping Use   Vaping Use: Never used  Substance and Sexual Activity   Alcohol use: No    Drug use: No   Sexual activity: Not on file  Other Topics Concern   Not on file  Social History Narrative   Not on file   Social Determinants of Health   Financial Resource Strain: Not on file  Food Insecurity: Not on file  Transportation Needs: Not on file  Physical Activity: Not on file  Stress: Not on file  Social Connections: Not on file    Family History  Problem Relation Age of Onset   Diabetes Mother    Diabetes Brother    Colon cancer Neg Hx    Colon polyps Neg Hx    Esophageal cancer Neg Hx    Rectal cancer Neg Hx    Stomach cancer Neg Hx     Health Maintenance  Topic Date Due   COVID-19 Vaccine (4 - 2023-24 season) 12/15/2022 (Originally 07/15/2022)   INFLUENZA VACCINE  02/12/2023 (Originally 06/14/2022)   Hepatitis C Screening  03/05/2023 (Originally 10/15/1990)   HIV Screening  03/05/2023 (Originally 10/16/1987)   PAP SMEAR-Modifier  02/04/2023   MAMMOGRAM  10/07/2023   COLONOSCOPY (Pts 45-64yr Insurance coverage will need to be confirmed)  01/15/2027   DTaP/Tdap/Td (2 - Td or Tdap) 07/26/2028   HPV VACCINES  Aged Out     ----------------------------------------------------------------------------------------------------------------------------------------------------------------------------------------------------------------- Physical Exam BP 119/76 (BP Location: Left Arm, Patient Position: Sitting, Cuff Size: Large)   Pulse 63   Ht '5\' 2"'$  (1.575 m)  Wt 209 lb (94.8 kg)   SpO2 97%   BMI 38.23 kg/m   Physical Exam Constitutional:      General: She is not in acute distress. HENT:     Head: Normocephalic and atraumatic.     Right Ear: Tympanic membrane and ear canal normal.     Left Ear: Tympanic membrane and ear canal normal.     Nose: Nose normal.  Eyes:     General: No scleral icterus.    Conjunctiva/sclera: Conjunctivae normal.  Neck:     Thyroid: No thyromegaly.  Cardiovascular:     Rate and Rhythm: Normal rate and regular rhythm.      Heart sounds: Normal heart sounds.  Pulmonary:     Effort: Pulmonary effort is normal.     Breath sounds: Normal breath sounds.  Abdominal:     General: Bowel sounds are normal. There is no distension.     Palpations: Abdomen is soft.     Tenderness: There is no abdominal tenderness. There is no guarding.  Musculoskeletal:        General: Normal range of motion.     Cervical back: Normal range of motion and neck supple.  Lymphadenopathy:     Cervical: No cervical adenopathy.  Skin:    General: Skin is warm and dry.     Findings: No rash.  Neurological:     General: No focal deficit present.     Mental Status: She is alert and oriented to person, place, and time.     Cranial Nerves: No cranial nerve deficit.     Coordination: Coordination normal.  Psychiatric:        Mood and Affect: Mood normal.        Behavior: Behavior normal.     ------------------------------------------------------------------------------------------------------------------------------------------------------------------------------------------------------------------- Assessment and Plan  Well adult exam Well adult Orders Placed This Encounter  Procedures   COMPLETE METABOLIC PANEL WITH GFR   CBC with Differential   Lipid Panel w/reflex Direct LDL   TSH  Screenings: per lab orders Immunizations: per lab orders Anticipatory guidance/risk factor reduction: Recommendations per AVS.   No orders of the defined types were placed in this encounter.   No follow-ups on file.    This visit occurred during the SARS-CoV-2 public health emergency.  Safety protocols were in place, including screening questions prior to the visit, additional usage of staff PPE, and extensive cleaning of exam room while observing appropriate contact time as indicated for disinfecting solutions.

## 2022-10-12 NOTE — Patient Instructions (Signed)
Preventive Care 40-50 Years Old, Female Preventive care refers to lifestyle choices and visits with your health care provider that can promote health and wellness. Preventive care visits are also called wellness exams. What can I expect for my preventive care visit? Counseling Your health care provider may ask you questions about your: Medical history, including: Past medical problems. Family medical history. Pregnancy history. Current health, including: Menstrual cycle. Method of birth control. Emotional well-being. Home life and relationship well-being. Sexual activity and sexual health. Lifestyle, including: Alcohol, nicotine or tobacco, and drug use. Access to firearms. Diet, exercise, and sleep habits. Work and work environment. Sunscreen use. Safety issues such as seatbelt and bike helmet use. Physical exam Your health care provider will check your: Height and weight. These may be used to calculate your BMI (body mass index). BMI is a measurement that tells if you are at a healthy weight. Waist circumference. This measures the distance around your waistline. This measurement also tells if you are at a healthy weight and may help predict your risk of certain diseases, such as type 2 diabetes and high blood pressure. Heart rate and blood pressure. Body temperature. Skin for abnormal spots. What immunizations do I need?  Vaccines are usually given at various ages, according to a schedule. Your health care provider will recommend vaccines for you based on your age, medical history, and lifestyle or other factors, such as travel or where you work. What tests do I need? Screening Your health care provider may recommend screening tests for certain conditions. This may include: Lipid and cholesterol levels. Diabetes screening. This is done by checking your blood sugar (glucose) after you have not eaten for a while (fasting). Pelvic exam and Pap test. Hepatitis B test. Hepatitis C  test. HIV (human immunodeficiency virus) test. STI (sexually transmitted infection) testing, if you are at risk. Lung cancer screening. Colorectal cancer screening. Mammogram. Talk with your health care provider about when you should start having regular mammograms. This may depend on whether you have a family history of breast cancer. BRCA-related cancer screening. This may be done if you have a family history of breast, ovarian, tubal, or peritoneal cancers. Bone density scan. This is done to screen for osteoporosis. Talk with your health care provider about your test results, treatment options, and if necessary, the need for more tests. Follow these instructions at home: Eating and drinking  Eat a diet that includes fresh fruits and vegetables, whole grains, lean protein, and low-fat dairy products. Take vitamin and mineral supplements as recommended by your health care provider. Do not drink alcohol if: Your health care provider tells you not to drink. You are pregnant, may be pregnant, or are planning to become pregnant. If you drink alcohol: Limit how much you have to 0-1 drink a day. Know how much alcohol is in your drink. In the [place] one drink equals one 12 oz bottle of beer (355 mL), one 5 oz glass of wine (148 mL), or one 1 oz glass of hard liquor (44 mL). Lifestyle Brush your teeth every morning and night with fluoride toothpaste. Floss one time each day. Exercise for at least 30 minutes 5 or more days each week. Do not use any products that contain nicotine or tobacco. These products include cigarettes, chewing tobacco, and vaping devices, such as e-cigarettes. If you need help quitting, ask your health care provider. Do not use drugs. If you are sexually active, practice safe sex. Use a condom or other form of protection to   prevent STIs. If you do not wish to become pregnant, use a form of birth control. If you plan to become pregnant, see your health care provider for a  prepregnancy visit. Take aspirin only as told by your health care provider. Make sure that you understand how much to take and what form to take. Work with your health care provider to find out whether it is safe and beneficial for you to take aspirin daily. Find healthy ways to manage stress, such as: Meditation, yoga, or listening to music. Journaling. Talking to a trusted person. Spending time with friends and family. Minimize exposure to UV radiation to reduce your risk of skin cancer. Safety Always wear your seat belt while driving or riding in a vehicle. Do not drive: If you have been drinking alcohol. Do not ride with someone who has been drinking. When you are tired or distracted. While texting. If you have been using any mind-altering substances or drugs. Wear a helmet and other protective equipment during sports activities. If you have firearms in your house, make sure you follow all gun safety procedures. Seek help if you have been physically or sexually abused. What's next? Visit your health care provider once a year for an annual wellness visit. Ask your health care provider how often you should have your eyes and teeth checked. Stay up to date on all vaccines. This information is not intended to replace advice given to you by your health care provider. Make sure you discuss any questions you have with your health care provider. Document Revised: 04/28/2021 Document Reviewed: 04/28/2021 Elsevier Patient Education  [place].

## 2022-10-12 NOTE — Assessment & Plan Note (Signed)
Well adult Orders Placed This Encounter  Procedures   COMPLETE METABOLIC PANEL WITH GFR   CBC with Differential   Lipid Panel w/reflex Direct LDL   TSH  Screenings: per lab orders Immunizations: per lab orders Anticipatory guidance/risk factor reduction: Recommendations per AVS.

## 2022-10-13 ENCOUNTER — Other Ambulatory Visit: Payer: Self-pay | Admitting: Family Medicine

## 2022-10-13 LAB — COMPLETE METABOLIC PANEL WITH GFR
AG Ratio: 1.4 (calc) (ref 1.0–2.5)
ALT: 27 U/L (ref 6–29)
AST: 23 U/L (ref 10–35)
Albumin: 4.4 g/dL (ref 3.6–5.1)
Alkaline phosphatase (APISO): 93 U/L (ref 31–125)
BUN: 20 mg/dL (ref 7–25)
CO2: 27 mmol/L (ref 20–32)
Calcium: 9.5 mg/dL (ref 8.6–10.2)
Chloride: 104 mmol/L (ref 98–110)
Creat: 0.72 mg/dL (ref 0.50–0.99)
Globulin: 3.1 g/dL (calc) (ref 1.9–3.7)
Glucose, Bld: 97 mg/dL (ref 65–99)
Potassium: 4.5 mmol/L (ref 3.5–5.3)
Sodium: 140 mmol/L (ref 135–146)
Total Bilirubin: 0.7 mg/dL (ref 0.2–1.2)
Total Protein: 7.5 g/dL (ref 6.1–8.1)
eGFR: 102 mL/min/{1.73_m2} (ref >=60)

## 2022-10-13 LAB — CBC WITH DIFFERENTIAL/PLATELET
Absolute Monocytes: 410 cells/uL (ref 200–950)
Basophils Absolute: 29 cells/uL (ref 0–200)
Basophils Relative: 0.5 %
Eosinophils Absolute: 160 cells/uL (ref 15–500)
Eosinophils Relative: 2.8 %
HCT: 46.4 % — ABNORMAL HIGH (ref 35.0–45.0)
Hemoglobin: 15.1 g/dL (ref 11.7–15.5)
Lymphs Abs: 1750 cells/uL (ref 850–3900)
MCH: 28.7 pg (ref 27.0–33.0)
MCHC: 32.5 g/dL (ref 32.0–36.0)
MCV: 88.2 fL (ref 80.0–100.0)
MPV: 14.2 fL — ABNORMAL HIGH (ref 7.5–12.5)
Monocytes Relative: 7.2 %
Neutro Abs: 3352 cells/uL (ref 1500–7800)
Neutrophils Relative %: 58.8 %
Platelets: 194 10*3/uL (ref 140–400)
RBC: 5.26 10*6/uL — ABNORMAL HIGH (ref 3.80–5.10)
RDW: 14.9 % (ref 11.0–15.0)
Total Lymphocyte: 30.7 %
WBC: 5.7 10*3/uL (ref 3.8–10.8)

## 2022-10-13 LAB — LIPID PANEL W/REFLEX DIRECT LDL
Cholesterol: 217 mg/dL — ABNORMAL HIGH (ref <200)
HDL: 51 mg/dL (ref >=50)
LDL Cholesterol (Calc): 140 mg/dL (calc) — ABNORMAL HIGH
Non-HDL Cholesterol (Calc): 166 mg/dL (calc) — ABNORMAL HIGH (ref <130)
Total CHOL/HDL Ratio: 4.3 (calc) (ref <5.0)
Triglycerides: 135 mg/dL (ref <150)

## 2022-10-13 LAB — TSH: TSH: 2.36 mIU/L

## 2022-10-13 MED ORDER — LEVOTHYROXINE SODIUM 112 MCG PO TABS: 112.0000 ug | ORAL_TABLET | Freq: Every day | ORAL | 1 refills | Status: DC

## 2022-10-19 ENCOUNTER — Encounter: Payer: Self-pay | Admitting: Family Medicine

## 2022-10-19 NOTE — Telephone Encounter (Signed)
Please clarify what she is asking.  Previous result note sent and it appears that she reviewed these.

## 2022-10-20 ENCOUNTER — Other Ambulatory Visit (HOSPITAL_COMMUNITY)
Admission: RE | Admit: 2022-10-20 | Discharge: 2022-10-20 | Disposition: A | Discharge status: Home / Self Care | Payer: BC Managed Care – PPO | Source: Ambulatory Visit | Attending: Obstetrics and Gynecology | Admitting: Obstetrics and Gynecology

## 2022-10-20 ENCOUNTER — Ambulatory Visit (INDEPENDENT_AMBULATORY_CARE_PROVIDER_SITE_OTHER): Payer: BC Managed Care – PPO | Admitting: Obstetrics and Gynecology

## 2022-10-20 ENCOUNTER — Encounter: Payer: Self-pay | Admitting: Obstetrics and Gynecology

## 2022-10-20 VITALS — BP 137/85 | HR 62 | Resp 16 | Ht 62.0 in | Wt 212.0 lb

## 2022-10-20 DIAGNOSIS — N912 Amenorrhea, unspecified: Secondary | ICD-10-CM | POA: Diagnosis not present

## 2022-10-20 DIAGNOSIS — Z01419 Encounter for gynecological examination (general) (routine) without abnormal findings: Secondary | ICD-10-CM

## 2022-10-20 DIAGNOSIS — E669 Obesity, unspecified: Secondary | ICD-10-CM | POA: Diagnosis not present

## 2022-10-20 DIAGNOSIS — N950 Postmenopausal bleeding: Secondary | ICD-10-CM | POA: Diagnosis not present

## 2022-10-20 DIAGNOSIS — Z6838 Body mass index (BMI) 38.0-38.9, adult: Secondary | ICD-10-CM

## 2022-10-20 NOTE — Progress Notes (Signed)
ANNUAL EXAM Patient name: Sarah Benjamin MRN [id number]  Date of birth: 16-Feb-1971 Chief Complaint:   Annual Exam  History of Present Illness:   Sarah Benjamin is a 51 y.o. G2P2 female being seen today for a routine annual exam.   Current complaints: She has not had a period for 2 years. 2 weeks ago had bleeding that was light spotting. She notes she does have history of PCOS so unsure if she is postmenopausal vs PCOS. She also has thyroid dysfunction but her last TSH in November was normal.   No LMP recorded. (Menstrual status: Perimenopausal).  Last pap: None recently. H/O abnormal pap: yes - she did paps q6 months but then resolved. Never had surgery on her cervix. Last MXR: 10/13/2022  Health Maintenance Due  Topic Date Due   Zoster Vaccines- Shingrix (1 of 2) Never done    Review of Systems:   Pertinent items are noted in HPI Denies any headaches, blurred vision, fatigue, shortness of breath, chest pain, abdominal pain, abnormal vaginal discharge/itching/odor/irritation, problems with periods, bowel movements, urination, or intercourse unless otherwise stated above.  Pertinent History Reviewed:  Reviewed past medical,surgical, social and family history.  Reviewed problem list, medications and allergies. Physical Assessment:   Vitals:   10/20/22 0950  BP: 137/85  Pulse: 62  Resp: 16  Weight: 212 lb (96.2 kg)  Height: '5\' 2"'$  (1.575 m)  Body mass index is 38.78 kg/m.   Physical Examination:  General appearance - well appearing, and in no distress Mental status - alert, oriented to person, place, and time Psych:  She has a normal mood and affect Skin - warm and dry, normal color, no suspicious lesions noted Chest - effort normal Heart - normal rate  Breasts - breasts appear normal, no suspicious masses, no skin or nipple changes or axillary nodes Abdomen - soft, nontender, nondistended, no masses or organomegaly Pelvic -  VULVA: normal appearing vulva with no masses,  tenderness or lesions  VAGINA: normal appearing vagina with normal color and discharge, no lesions  CERVIX: normal appearing cervix without discharge or lesions, no CMT UTERUS: uterus is felt to be normal size, shape, consistency and nontender  ADNEXA: No adnexal masses or tenderness noted. Extremities:  No swelling or varicosities noted  Chaperone present for exam  No results found for this or any previous visit (from the past 24 hour(s)).  Assessment & Plan:  Sarah Benjamin was seen today for annual exam.  Diagnoses and all orders for this visit:  Well woman exam with routine gynecological exam - Cervical cancer screening: Discussed guidelines. Pap with HPV done - Breast Health: Encouraged self breast awareness/SBE. Discussed limits of clinical breast exam for detecting breast cancer. Discussed importance of annual MXR. MXR is up to date: 10/13/2022 - Climacteric/Sexual health: Reviewed typical and atypical symptoms of menopause/peri-menopause. Discussed PMB and to call if any amount of spotting.  - Colonoscopy: 2022 - given 7-10 - F/U 12 months and prn -     Cytology - PAP( [place])  Amenorrhea - Discussed we will check [place]. If elevated, then this is c/w menopause and I would recommend TVUS to evaluate her bleeding. If it is normal, then she is perimenopausal. In this setting, I would still recommend Provera or Megace to allow for withdrawal bleeds every 2-3 months then.  -     FSH  Class 2 obesity with body mass index (BMI) of 38.0 to 38.9 in adult, unspecified obesity type, unspecified whether serious comorbidity present -  Will do Amb Ref to weight loss clinic similar to .       Orders Placed This Encounter  Procedures   [place]    Meds: No orders of the defined types were placed in this encounter.   Follow-up: Return in about 1 year (around 10/21/2023) for annual.  Radene Gunning, MD 10/20/2022 10:23 AM

## 2022-10-21 LAB — FOLLICLE STIMULATING HORMONE: FSH: 48.3 m[IU]/mL

## 2022-10-21 NOTE — Addendum Note (Signed)
Addended by: Radene Gunning A on: 10/21/2022 10:00 AM   Modules accepted: Orders

## 2022-10-24 ENCOUNTER — Ambulatory Visit (INDEPENDENT_AMBULATORY_CARE_PROVIDER_SITE_OTHER): Payer: BC Managed Care – PPO

## 2022-10-24 DIAGNOSIS — N950 Postmenopausal bleeding: Secondary | ICD-10-CM | POA: Diagnosis not present

## 2022-10-24 DIAGNOSIS — N859 Noninflammatory disorder of uterus, unspecified: Secondary | ICD-10-CM | POA: Diagnosis not present

## 2022-10-24 LAB — CYTOLOGY - PAP
Comment: NEGATIVE
Diagnosis: NEGATIVE
High risk HPV: NEGATIVE

## 2022-10-25 ENCOUNTER — Encounter: Payer: Self-pay | Admitting: Obstetrics and Gynecology

## 2022-10-26 ENCOUNTER — Encounter: Payer: Self-pay | Admitting: Obstetrics and Gynecology

## 2022-10-26 ENCOUNTER — Telehealth: Payer: Self-pay | Admitting: *Deleted

## 2022-10-26 ENCOUNTER — Encounter: Payer: Self-pay | Admitting: Family Medicine

## 2022-10-26 NOTE — Telephone Encounter (Signed)
Left patient a message to call and schedule Colpo with Dr. Damita Dunnings. Will need to be 11/24/2022 or after due to the number of Colpos that are already scheduled prior to that date for Dr. Damita Dunnings.

## 2022-11-17 ENCOUNTER — Ambulatory Visit (INDEPENDENT_AMBULATORY_CARE_PROVIDER_SITE_OTHER): Payer: BC Managed Care – PPO | Admitting: Obstetrics and Gynecology

## 2022-11-17 ENCOUNTER — Encounter: Payer: Self-pay | Admitting: Obstetrics and Gynecology

## 2022-11-17 VITALS — BP 130/80 | HR 81 | Ht 62.0 in | Wt 211.0 lb

## 2022-11-17 DIAGNOSIS — N950 Postmenopausal bleeding: Secondary | ICD-10-CM | POA: Diagnosis not present

## 2022-11-17 LAB — POCT URINE PREGNANCY: Preg Test, Ur: NEGATIVE

## 2022-11-17 NOTE — Patient Instructions (Signed)
Salonpas or [employer] patches but ideally the ones with Capsaicin in them.

## 2022-11-17 NOTE — Progress Notes (Signed)
      GYNECOLOGY OFFICE PROCEDURE NOTE   BROOKLEN RUNQUIST is a 52 y.o. G2P2 here for endometrial biopsy for PMB. Recent ultrasound also showed EL 5.2cm.  Today, she reports no concerning symptoms. Of note, pap on 10/20/2022 was normal, negative HPV.   ENDOMETRIAL BIOPSY     The indications for endometrial biopsy were reviewed.   Risks of the biopsy including cramping, bleeding, infection, uterine perforation, inadequate specimen and need for additional procedures were discussed. Offered alternative of hysteroscopy, dilation and curettage in OR. The patient states she understands the R/B/I/A and agrees to undergo procedure today. Urine pregnancy test was Not indicated. Consent was signed. Time out was performed.    Patient was positioned in dorsal lithotomy position. A vaginal speculum was placed.  The cervix was visualized and was prepped with Betadine.  A single-toothed tenaculum was placed on the anterior lip of the cervix to stabilize it. I attempted entry into the cervical os with os finders, a uterine sound as well as pratt dilators without success. Her uterus was confirmed anteverted by her [place] images and I was still unable to get past her internal os. I recommended follow up in the OR for the procedure. Pt agrees with plan and will take any time in order to ensure resolution.    Patient was given post procedure instructions.  Will follow up pathology and manage accordingly; patient will be contacted with results and recommendations.  Routine preventative health maintenance measures emphasized.       Radene Gunning, MD, [place] for [facility], [place]

## 2022-11-21 ENCOUNTER — Encounter: Payer: Self-pay | Admitting: Obstetrics and Gynecology

## 2022-11-25 IMAGING — DX DG LUMBAR SPINE COMPLETE 4+V
5 series · 5 of 5 positions shown · non-contrast
Comparison: X-ray 07/08/2020.

CLINICAL DATA: Low back pain

EXAM:
LUMBAR SPINE - COMPLETE 4+ VIEW

[l-spine ap]
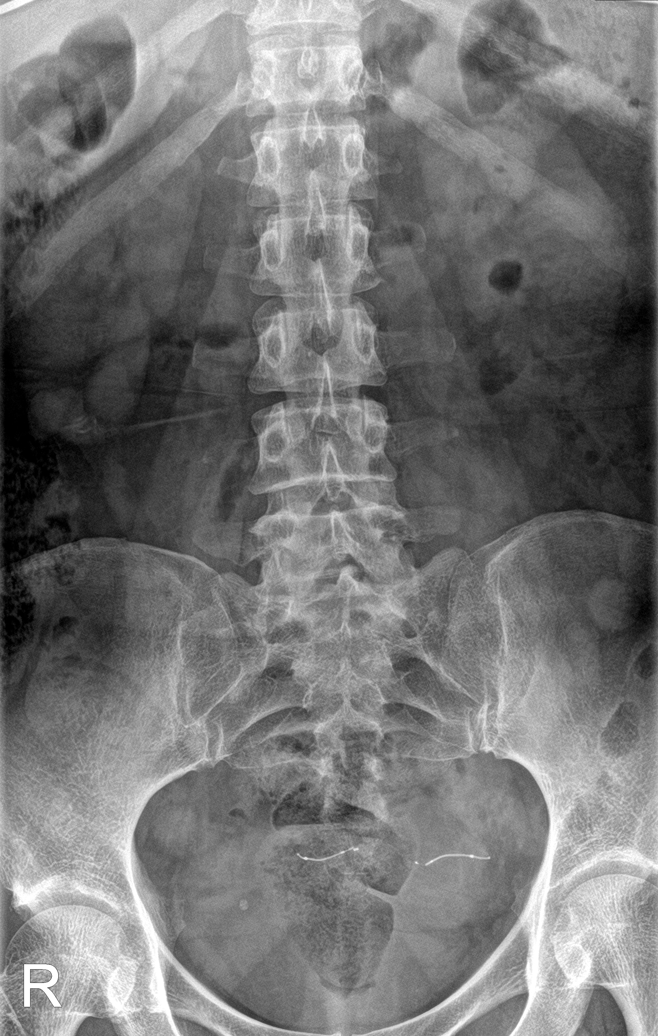

[l-spine obl (1 of 2)]
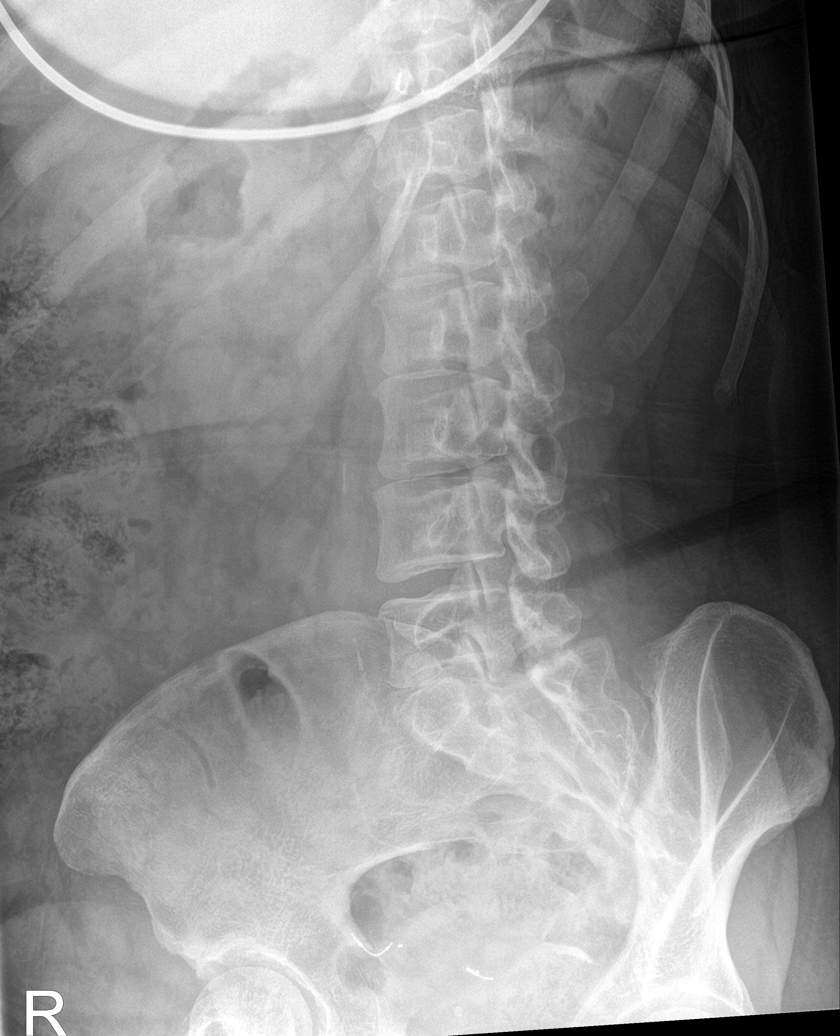

[l-spine obl (2 of 2)]
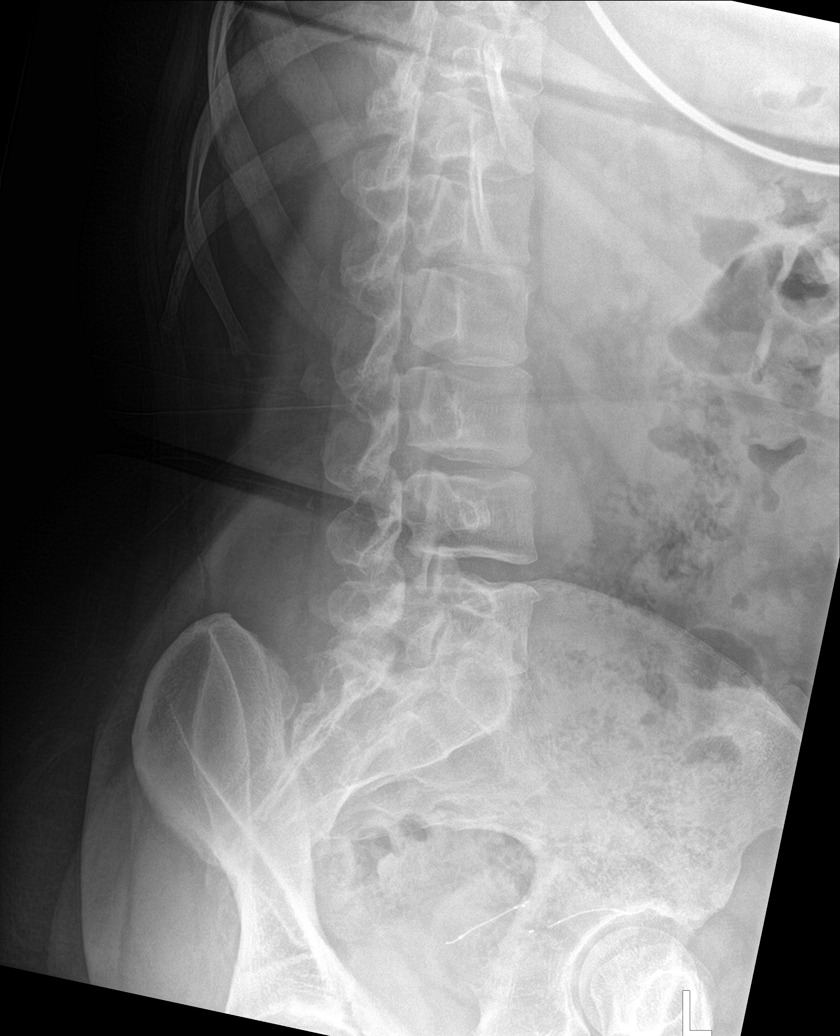

[l-spine lat]
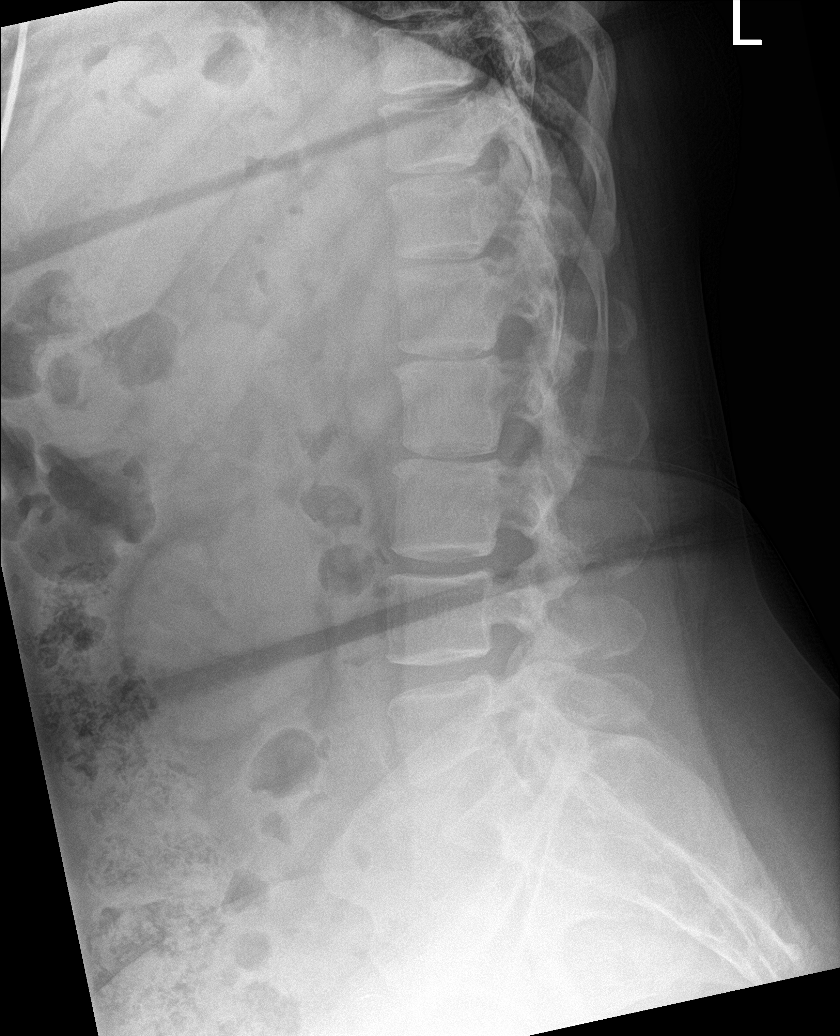

[l-spine spot]
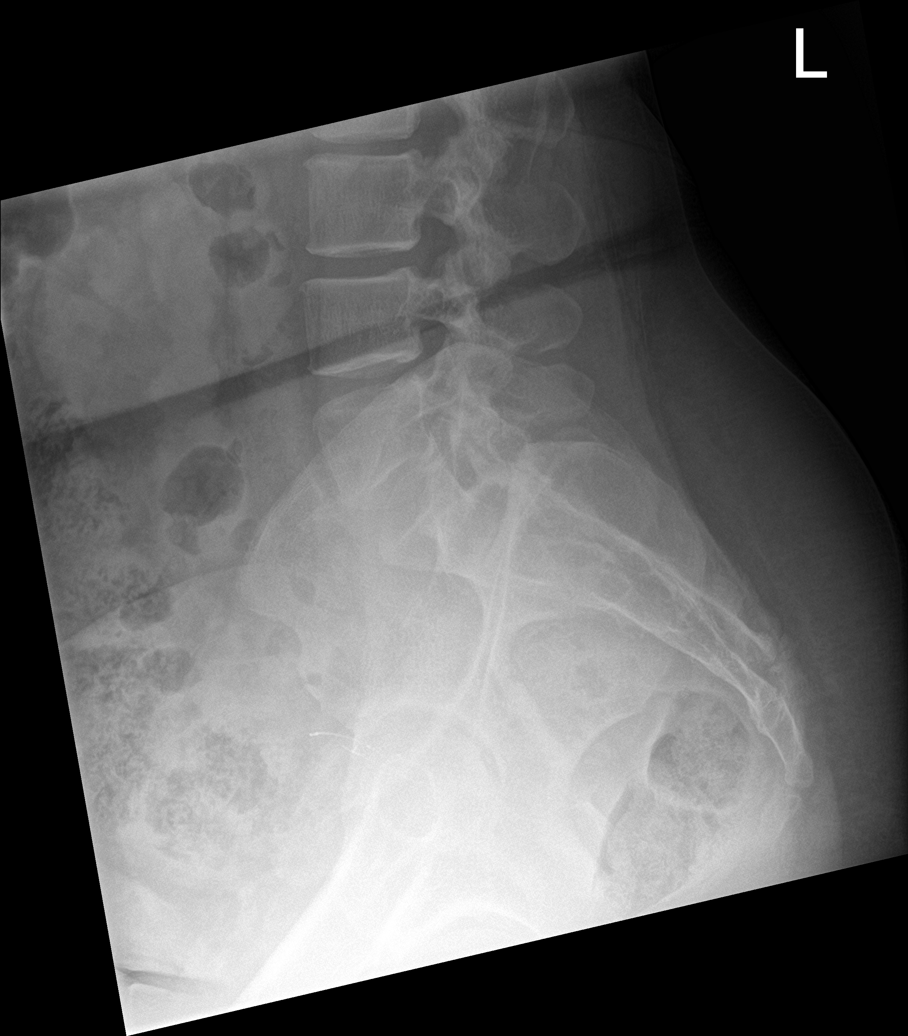

[5 of 5 positions shown; findings below may reference images not displayed]

FINDINGS: No fracture or malalignment. Mild multilevel degenerative disc
disease. No other abnormalities identified in the lumbar spine.

Essure coils are identified in the pelvis.
IMPRESSION: Mild multilevel degenerative disc disease.  No other abnormalities.

## 2023-01-03 ENCOUNTER — Ambulatory Visit (INDEPENDENT_AMBULATORY_CARE_PROVIDER_SITE_OTHER): Payer: BC Managed Care – PPO

## 2023-01-03 ENCOUNTER — Ambulatory Visit: Payer: BC Managed Care – PPO | Admitting: Sports Medicine

## 2023-01-03 ENCOUNTER — Encounter: Payer: Self-pay | Admitting: Sports Medicine

## 2023-01-03 DIAGNOSIS — M5136 Other intervertebral disc degeneration, lumbar region: Secondary | ICD-10-CM | POA: Diagnosis not present

## 2023-01-03 DIAGNOSIS — G8929 Other chronic pain: Secondary | ICD-10-CM

## 2023-01-03 DIAGNOSIS — M5416 Radiculopathy, lumbar region: Secondary | ICD-10-CM

## 2023-01-03 DIAGNOSIS — M47816 Spondylosis without myelopathy or radiculopathy, lumbar region: Secondary | ICD-10-CM | POA: Diagnosis not present

## 2023-01-03 MED ORDER — PREDNISONE 50 MG PO TABS: ORAL_TABLET | ORAL | 0 refills | Status: DC

## 2023-01-03 NOTE — Progress Notes (Signed)
    Procedures performed today:    None.  Independent interpretation of notes and tests performed by another provider:   None.  Brief History, Exam, Impression, and Recommendations:    Lumbar back pain with radiculopathy affecting lower extremity Pleasant 52 year old female, long history of axial low back pain with radiation down the legs, left worse than right, L4 versus L5 distribution. Pain is worse with sitting, flexion, Valsalva. On exam today she does have some foot drop on the left, for this reason we will proceed with steroids, x-rays, early MRI with plans for an L4 epidural. Home conditioning given. Return to see me to go over MRI results.    ____________________________________________ Gwen Her. Dianah Field, M.D., ABFM., CAQSM., AME. Primary Care and Sports Medicine [facility]  Adjunct Professor of [employer]  Risk manager

## 2023-01-03 NOTE — Assessment & Plan Note (Signed)
Pleasant 52 year old female, long history of axial low back pain with radiation down the legs, left worse than right, L4 versus L5 distribution. Pain is worse with sitting, flexion, Valsalva. On exam today she does have some foot drop on the left, for this reason we will proceed with steroids, x-rays, early MRI with plans for an L4 epidural. Home conditioning given. Return to see me to go over MRI results.

## 2023-01-05 ENCOUNTER — Encounter: Payer: Self-pay | Admitting: Sports Medicine

## 2023-01-09 NOTE — Anesthesia Preprocedure Evaluation (Signed)
Anesthesia Evaluation  Patient identified by MRN, date of birth, ID band Patient awake    Reviewed: Allergy & Precautions, NPO status , Patient's Chart, lab work & pertinent test results  Airway Mallampati: II  TM Distance: >3 FB Neck ROM: Full    Dental no notable dental hx.    Pulmonary asthma    Pulmonary exam normal breath sounds clear to auscultation       Cardiovascular hypertension, Pt. on medications Normal cardiovascular exam Rhythm:Regular Rate:Normal     Neuro/Psych  Headaches  negative psych ROS   GI/Hepatic negative GI ROS, Neg liver ROS,,,  Endo/Other  Hypothyroidism  Morbid obesity  Renal/GU negative Renal ROS  Female GU complaint     Musculoskeletal  (+) Arthritis , Osteoarthritis,    Abdominal  (+) + obese  Peds  Hematology negative hematology ROS (+)   Anesthesia Other Findings   Reproductive/Obstetrics                              Anesthesia Physical Anesthesia Plan  ASA: 3  Anesthesia Plan: General   Post-op Pain Management: Tylenol PO (pre-op)* and Toradol IV (intra-op)*   Induction: Intravenous  PONV Risk Score and Plan: 4 or greater and Ondansetron, Dexamethasone, Midazolam and Treatment may vary due to age or medical condition  Airway Management Planned: LMA  Additional Equipment: None  Intra-op Plan:   Post-operative Plan: Extubation in OR  Informed Consent: I have reviewed the patients History and Physical, chart, labs and discussed the procedure including the risks, benefits and alternatives for the proposed anesthesia with the patient or authorized representative who has indicated his/her understanding and acceptance.     Dental advisory given  Plan Discussed with: CRNA  Anesthesia Plan Comments:         Anesthesia Quick Evaluation

## 2023-01-10 ENCOUNTER — Other Ambulatory Visit: Payer: Self-pay

## 2023-01-10 ENCOUNTER — Encounter (HOSPITAL_COMMUNITY): Payer: Self-pay | Admitting: Obstetrics and Gynecology

## 2023-01-10 NOTE — Progress Notes (Addendum)
Spoke with pt for pre-op call. States she was told years ago she had a heart murmur, but was told that her Echo done in 2019 was ok.  States she was eating pizza last week and had an episode of a fast heart rate with the feeling of shortness of breath. It went away on it's own a short time later and she did not see any medical person about it.  Pt states she is pre-diabetic.   Shower instructions given to pt.  Messaged Dr. Damita Dunnings to get permission for pt to be able have clear liquids 3 hours prior to surgery. She replied that would be fine to do.

## 2023-01-11 ENCOUNTER — Ambulatory Visit (HOSPITAL_COMMUNITY): Payer: BC Managed Care – PPO | Admitting: Anesthesiology

## 2023-01-11 ENCOUNTER — Encounter (HOSPITAL_COMMUNITY)
Admission: RE | Disposition: A | Discharge status: Home / Self Care | Payer: Self-pay | Source: Ambulatory Visit | Attending: Obstetrics and Gynecology

## 2023-01-11 ENCOUNTER — Encounter (HOSPITAL_COMMUNITY): Payer: Self-pay | Admitting: Obstetrics and Gynecology

## 2023-01-11 ENCOUNTER — Other Ambulatory Visit: Payer: Self-pay

## 2023-01-11 ENCOUNTER — Observation Stay (HOSPITAL_COMMUNITY)
Admission: RE | Admit: 2023-01-11 | Discharge: 2023-01-12 | Disposition: A | Discharge status: Home / Self Care | Payer: BC Managed Care – PPO | Source: Ambulatory Visit | Attending: Obstetrics and Gynecology | Admitting: Obstetrics and Gynecology

## 2023-01-11 DIAGNOSIS — Z8616 Personal history of COVID-19: Secondary | ICD-10-CM | POA: Diagnosis not present

## 2023-01-11 DIAGNOSIS — I10 Essential (primary) hypertension: Secondary | ICD-10-CM | POA: Insufficient documentation

## 2023-01-11 DIAGNOSIS — N711 Chronic inflammatory disease of uterus: Secondary | ICD-10-CM | POA: Diagnosis not present

## 2023-01-11 DIAGNOSIS — G8918 Other acute postprocedural pain: Secondary | ICD-10-CM | POA: Diagnosis present

## 2023-01-11 DIAGNOSIS — N950 Postmenopausal bleeding: Principal | ICD-10-CM | POA: Diagnosis present

## 2023-01-11 DIAGNOSIS — N710 Acute inflammatory disease of uterus: Secondary | ICD-10-CM | POA: Diagnosis not present

## 2023-01-11 DIAGNOSIS — Z79899 Other long term (current) drug therapy: Secondary | ICD-10-CM | POA: Insufficient documentation

## 2023-01-11 DIAGNOSIS — E039 Hypothyroidism, unspecified: Secondary | ICD-10-CM | POA: Insufficient documentation

## 2023-01-11 DIAGNOSIS — Z7722 Contact with and (suspected) exposure to environmental tobacco smoke (acute) (chronic): Secondary | ICD-10-CM | POA: Insufficient documentation

## 2023-01-11 DIAGNOSIS — J45909 Unspecified asthma, uncomplicated: Secondary | ICD-10-CM | POA: Diagnosis not present

## 2023-01-11 DIAGNOSIS — Z01818 Encounter for other preprocedural examination: Secondary | ICD-10-CM

## 2023-01-11 DIAGNOSIS — N858 Other specified noninflammatory disorders of uterus: Secondary | ICD-10-CM | POA: Diagnosis not present

## 2023-01-11 HISTORY — DX: Hypothyroidism, unspecified: E03.9

## 2023-01-11 HISTORY — DX: Prediabetes: R73.03

## 2023-01-11 HISTORY — DX: Bell's palsy: G51.0

## 2023-01-11 HISTORY — DX: Essential (primary) hypertension: I10

## 2023-01-11 HISTORY — DX: Unspecified asthma, uncomplicated: J45.909

## 2023-01-11 HISTORY — PX: HYSTEROSCOPY: SHX211

## 2023-01-11 HISTORY — PX: HYSTEROSCOPY WITH D & C: SHX1775

## 2023-01-11 HISTORY — DX: Pneumonia, unspecified organism: J18.9

## 2023-01-11 LAB — POCT I-STAT, CHEM 8
BUN: 15 mg/dL (ref 6–20)
Calcium, Ion: 1.13 mmol/L — ABNORMAL LOW (ref 1.15–1.40)
Chloride: 101 mmol/L (ref 98–111)
Creatinine, Ser: 0.6 mg/dL (ref 0.44–1.00)
Glucose, Bld: 98 mg/dL (ref 70–99)
HCT: 46 % (ref 36.0–46.0)
Hemoglobin: 15.6 g/dL — ABNORMAL HIGH (ref 12.0–15.0)
Potassium: 4.2 mmol/L (ref 3.5–5.1)
Sodium: 139 mmol/L (ref 135–145)
TCO2: 27 mmol/L (ref 22–32)

## 2023-01-11 LAB — POCT PREGNANCY, URINE: Preg Test, Ur: NEGATIVE

## 2023-01-11 SURGERY — DILATATION AND CURETTAGE /HYSTEROSCOPY
Anesthesia: General | Site: Uterus

## 2023-01-11 MED ORDER — SIMETHICONE 80 MG PO CHEW: 80.0000 mg | CHEWABLE_TABLET | Freq: Four times a day (QID) | ORAL | Status: DC | PRN

## 2023-01-11 MED ORDER — FENTANYL CITRATE (PF) 250 MCG/5ML IJ SOLN
INTRAMUSCULAR | Status: AC
  Filled 2023-01-11: qty 5

## 2023-01-11 MED ORDER — HYDROMORPHONE HCL 1 MG/ML IJ SOLN
INTRAMUSCULAR | Status: AC
  Filled 2023-01-11: qty 1

## 2023-01-11 MED ORDER — PROPOFOL 10 MG/ML IV BOLUS
INTRAVENOUS | Status: DC | PRN
  Administered 2023-01-11: 200 mg via INTRAVENOUS

## 2023-01-11 MED ORDER — LACTATED RINGERS IV SOLN: INTRAVENOUS | Status: DC

## 2023-01-11 MED ORDER — METOCLOPRAMIDE HCL 5 MG/ML IJ SOLN: 10.0000 mg | Freq: Four times a day (QID) | INTRAMUSCULAR | Status: DC | PRN

## 2023-01-11 MED ORDER — OXYCODONE HCL 5 MG PO TABS
ORAL_TABLET | ORAL | Status: AC
  Filled 2023-01-11: qty 1

## 2023-01-11 MED ORDER — OXYCODONE HCL 5 MG PO TABS: 5.0000 mg | ORAL_TABLET | ORAL | 0 refills | Status: DC | PRN

## 2023-01-11 MED ORDER — FENTANYL CITRATE (PF) 100 MCG/2ML IJ SOLN
INTRAMUSCULAR | Status: AC
  Filled 2023-01-11: qty 2

## 2023-01-11 MED ORDER — OXYCODONE HCL 5 MG PO TABS
5.0000 mg | ORAL_TABLET | ORAL | Status: DC | PRN
  Administered 2023-01-12: 10 mg via ORAL
  Filled 2023-01-11: qty 2

## 2023-01-11 MED ORDER — HYDROMORPHONE HCL 1 MG/ML IJ SOLN
0.2500 mg | INTRAMUSCULAR | Status: DC | PRN
  Administered 2023-01-11 (×4): 0.5 mg via INTRAVENOUS

## 2023-01-11 MED ORDER — OXYCODONE HCL 5 MG PO TABS: 5.0000 mg | ORAL_TABLET | Freq: Once | ORAL | Status: DC | PRN

## 2023-01-11 MED ORDER — HYDROMORPHONE HCL 1 MG/ML IJ SOLN: 0.2000 mg | INTRAMUSCULAR | Status: DC | PRN

## 2023-01-11 MED ORDER — ACETAMINOPHEN 500 MG PO TABS
1000.0000 mg | ORAL_TABLET | Freq: Four times a day (QID) | ORAL | Status: DC
  Administered 2023-01-11 – 2023-01-12 (×3): 1000 mg via ORAL
  Filled 2023-01-11 (×3): qty 2

## 2023-01-11 MED ORDER — ACETAMINOPHEN 500 MG PO TABS: 500.0000 mg | ORAL_TABLET | Freq: Four times a day (QID) | ORAL | 0 refills | Status: DC | PRN

## 2023-01-11 MED ORDER — LIDOCAINE-EPINEPHRINE (PF) 1 %-1:200000 IJ SOLN
INTRAMUSCULAR | Status: AC
  Filled 2023-01-11: qty 30

## 2023-01-11 MED ORDER — IBUPROFEN 800 MG PO TABS: 800.0000 mg | ORAL_TABLET | Freq: Three times a day (TID) | ORAL | 0 refills | Status: DC | PRN

## 2023-01-11 MED ORDER — DEXAMETHASONE SODIUM PHOSPHATE 10 MG/ML IJ SOLN
INTRAMUSCULAR | Status: AC
  Filled 2023-01-11: qty 1

## 2023-01-11 MED ORDER — ACETAMINOPHEN 500 MG PO TABS
1000.0000 mg | ORAL_TABLET | Freq: Once | ORAL | Status: AC
  Administered 2023-01-11: 1000 mg via ORAL
  Filled 2023-01-11: qty 2

## 2023-01-11 MED ORDER — ONDANSETRON HCL 4 MG/2ML IJ SOLN: 4.0000 mg | Freq: Once | INTRAMUSCULAR | Status: DC | PRN

## 2023-01-11 MED ORDER — DEXAMETHASONE SODIUM PHOSPHATE 10 MG/ML IJ SOLN
INTRAMUSCULAR | Status: DC | PRN
  Administered 2023-01-11: 10 mg via INTRAVENOUS

## 2023-01-11 MED ORDER — IBUPROFEN 600 MG PO TABS: 600.0000 mg | ORAL_TABLET | Freq: Four times a day (QID) | ORAL | Status: DC

## 2023-01-11 MED ORDER — POLYETHYLENE GLYCOL 3350 17 G PO PACK: 17.0000 g | PACK | Freq: Every day | ORAL | Status: DC | PRN

## 2023-01-11 MED ORDER — ONDANSETRON HCL 4 MG/2ML IJ SOLN: 4.0000 mg | Freq: Four times a day (QID) | INTRAMUSCULAR | Status: DC | PRN

## 2023-01-11 MED ORDER — KETOROLAC TROMETHAMINE 30 MG/ML IJ SOLN
30.0000 mg | Freq: Four times a day (QID) | INTRAMUSCULAR | Status: DC
  Administered 2023-01-11 – 2023-01-12 (×3): 30 mg via INTRAVENOUS
  Filled 2023-01-11 (×3): qty 1

## 2023-01-11 MED ORDER — ZOLPIDEM TARTRATE 5 MG PO TABS: 5.0000 mg | ORAL_TABLET | Freq: Every evening | ORAL | Status: DC | PRN

## 2023-01-11 MED ORDER — KETOROLAC TROMETHAMINE 30 MG/ML IJ SOLN: 30.0000 mg | Freq: Once | INTRAMUSCULAR | Status: DC | PRN

## 2023-01-11 MED ORDER — KETOROLAC TROMETHAMINE 15 MG/ML IJ SOLN
15.0000 mg | INTRAMUSCULAR | Status: AC
  Administered 2023-01-11: 15 mg via INTRAVENOUS
  Filled 2023-01-11: qty 1

## 2023-01-11 MED ORDER — CHLORHEXIDINE GLUCONATE 0.12 % MT SOLN
15.0000 mL | Freq: Once | OROMUCOSAL | Status: AC
  Administered 2023-01-11: 15 mL via OROMUCOSAL
  Filled 2023-01-11: qty 15

## 2023-01-11 MED ORDER — LIDOCAINE 2% (20 MG/ML) 5 ML SYRINGE
INTRAMUSCULAR | Status: DC | PRN
  Administered 2023-01-11: 60 mg via INTRAVENOUS

## 2023-01-11 MED ORDER — ORAL CARE MOUTH RINSE: 15.0000 mL | Freq: Once | OROMUCOSAL | Status: AC

## 2023-01-11 MED ORDER — ONDANSETRON HCL 4 MG/2ML IJ SOLN
INTRAMUSCULAR | Status: DC | PRN
  Administered 2023-01-11: 4 mg via INTRAVENOUS

## 2023-01-11 MED ORDER — FENTANYL CITRATE (PF) 100 MCG/2ML IJ SOLN
25.0000 ug | INTRAMUSCULAR | Status: DC | PRN
  Administered 2023-01-11 (×3): 50 ug via INTRAVENOUS

## 2023-01-11 MED ORDER — MEPERIDINE HCL 25 MG/ML IJ SOLN: 6.2500 mg | INTRAMUSCULAR | Status: DC | PRN

## 2023-01-11 MED ORDER — PANTOPRAZOLE SODIUM 40 MG PO TBEC
40.0000 mg | DELAYED_RELEASE_TABLET | Freq: Every day | ORAL | Status: DC
  Administered 2023-01-11 – 2023-01-12 (×2): 40 mg via ORAL
  Filled 2023-01-11 (×2): qty 1

## 2023-01-11 MED ORDER — ONDANSETRON HCL 4 MG PO TABS: 4.0000 mg | ORAL_TABLET | Freq: Four times a day (QID) | ORAL | Status: DC | PRN

## 2023-01-11 MED ORDER — AMISULPRIDE (ANTIEMETIC) 5 MG/2ML IV SOLN
INTRAVENOUS | Status: AC
  Filled 2023-01-11: qty 4

## 2023-01-11 MED ORDER — POVIDONE-IODINE 10 % EX SWAB: 2.0000 | Freq: Once | CUTANEOUS | Status: DC

## 2023-01-11 MED ORDER — GABAPENTIN 100 MG PO CAPS
200.0000 mg | ORAL_CAPSULE | Freq: Two times a day (BID) | ORAL | Status: DC
  Administered 2023-01-11 – 2023-01-12 (×2): 200 mg via ORAL
  Filled 2023-01-11 (×2): qty 2

## 2023-01-11 MED ORDER — ONDANSETRON HCL 4 MG/2ML IJ SOLN
INTRAMUSCULAR | Status: AC
  Filled 2023-01-11: qty 2

## 2023-01-11 MED ORDER — MIDAZOLAM HCL 2 MG/2ML IJ SOLN
INTRAMUSCULAR | Status: DC | PRN
  Administered 2023-01-11: 2 mg via INTRAVENOUS

## 2023-01-11 MED ORDER — PROPOFOL 10 MG/ML IV BOLUS
INTRAVENOUS | Status: AC
  Filled 2023-01-11: qty 20

## 2023-01-11 MED ORDER — LIDOCAINE 2% (20 MG/ML) 5 ML SYRINGE
INTRAMUSCULAR | Status: AC
  Filled 2023-01-11: qty 5

## 2023-01-11 MED ORDER — MENTHOL 3 MG MT LOZG: 1.0000 | LOZENGE | OROMUCOSAL | Status: DC | PRN

## 2023-01-11 MED ORDER — METHOCARBAMOL 1000 MG/10ML IJ SOLN
500.0000 mg | Freq: Once | INTRAVENOUS | Status: DC
  Filled 2023-01-11: qty 5

## 2023-01-11 MED ORDER — AMISULPRIDE (ANTIEMETIC) 5 MG/2ML IV SOLN
10.0000 mg | Freq: Once | INTRAVENOUS | Status: AC | PRN
  Administered 2023-01-11: 10 mg via INTRAVENOUS

## 2023-01-11 MED ORDER — OXYCODONE HCL 5 MG/5ML PO SOLN: 5.0000 mg | Freq: Once | ORAL | Status: DC | PRN

## 2023-01-11 MED ORDER — ACETAMINOPHEN 500 MG PO TABS: 1000.0000 mg | ORAL_TABLET | ORAL | Status: AC

## 2023-01-11 MED ORDER — FENTANYL CITRATE (PF) 250 MCG/5ML IJ SOLN
INTRAMUSCULAR | Status: DC | PRN
  Administered 2023-01-11 (×2): 25 ug via INTRAVENOUS

## 2023-01-11 MED ORDER — MIDAZOLAM HCL 2 MG/2ML IJ SOLN
INTRAMUSCULAR | Status: AC
  Filled 2023-01-11: qty 2

## 2023-01-11 SURGICAL SUPPLY — 12 items
ABLATOR SURESOUND NOVASURE (ABLATOR) ×1 IMPLANT
CATH ROBINSON RED A/P 16FR (CATHETERS) IMPLANT
DILATOR CANAL MILEX (MISCELLANEOUS) IMPLANT
GAUZE 4X4 16PLY ~~LOC~~+RFID DBL (SPONGE) IMPLANT
GLOVE BIO SURGEON STRL SZ 6 (GLOVE) ×1 IMPLANT
GLOVE SURG UNDER POLY LF SZ7 (GLOVE) ×1 IMPLANT
GOWN STRL REUS W/ TWL LRG LVL3 (GOWN DISPOSABLE) ×2 IMPLANT
GOWN STRL REUS W/TWL LRG LVL3 (GOWN DISPOSABLE) ×2
KIT PROCEDURE FLUENT (KITS) ×1 IMPLANT
PACK VAGINAL MINOR WOMEN LF (CUSTOM PROCEDURE TRAY) ×1 IMPLANT
PAD OB MATERNITY 4.3X12.25 (PERSONAL CARE ITEMS) ×1 IMPLANT
TOWEL GREEN STERILE FF (TOWEL DISPOSABLE) ×1 IMPLANT

## 2023-01-11 NOTE — Op Note (Signed)
01/11/23 3:26 PM   Preoperative Diagnosis: PMB Postoperative Diagnosis: Same plus left essure coil Procedure: D&C, hysteroscopy  Surgeon: Dr. Damita Dunnings Assistant: None EBL 5 cc IVF: 500 cc  UOP: None - not drained  Specimens: EMC  Findings: NEFG. Normal appearing cervix - no posterior fornix - flush with the posterior vagina. Cervix was stenotic. Uterus was about 8 cm in length by scope. No uterine cavitary defects and endometrium was atrophic and smooth c/w menopause. Left essure coil noted to be largely protruding through into the cavity to the midpoint of the cavity.   Description of the procedure: Preop antibiotics not indicated. Informed consent reviewed and signed. Pt given opportunity to ask questions.   Pt prepped and draped in the dorsal lithotomy fashion after LMA anesthesia found to be adequate. Timeout performed.   Weighted speculum placed into the patient's vagina. Single tooth tenaculum applied to the 12 o'clock position of the cervix. Cervix progressively dilated to a 21 Pratt after using os finder to find the canal. 30 degree hysteroscope was inserted into the cavity with the aforementioned findings noted. No hysteroscopic instruments available. Attempted cysto grasper but did not fit through the scope. Attempted grasp of Essure coil with polyp forceps but unsuccessful. Some piece of the coil removed. Hysteroscope reinserted and coil still in place but not as far into the cavity.  EMC performed with minimal tissue (as expected).   Hemostatic at the end of the procedure. Procedure completed. All instruments removed. Counts correct x2.  Pt taken to recovery room in stable condition.  Radene Gunning, MD Attending [place], [facility] for [facility], [place]

## 2023-01-11 NOTE — Plan of Care (Signed)
  Problem: Education: Goal: Knowledge of the prescribed therapeutic regimen will improve Outcome: Progressing Goal: Understanding of sexual limitations or changes related to disease process or condition will improve Outcome: Progressing Goal: Individualized Educational Video(s) Outcome: Progressing   Problem: Self-Concept: Goal: Communication of feelings regarding changes in body function or appearance will improve Outcome: Progressing   Problem: Skin Integrity: Goal: Demonstration of wound healing without infection will improve Outcome: Progressing   Problem: Education: Goal: Knowledge of General Education information will improve Description: Including pain rating scale, medication(s)/side effects and non-pharmacologic comfort measures Outcome: Progressing

## 2023-01-11 NOTE — Transfer of Care (Signed)
Immediate Anesthesia Transfer of Care Note  Patient: Sarah Benjamin  Procedure(s) Performed: DILATATION AND CURETTAGE (Uterus) HYSTEROSCOPY (Uterus)  Patient Location: PACU  Anesthesia Type:General  Level of Consciousness: drowsy  Airway & Oxygen Therapy: Patient Spontanous Breathing and Patient connected to nasal cannula oxygen  Post-op Assessment: Report given to RN and Post -op Vital signs reviewed and stable  Post vital signs: Reviewed and stable  Last Vitals:  Vitals Value Taken Time  BP 124/90 01/11/23 1523  Temp 36.6 C 01/11/23 1523  Pulse 68 01/11/23 1523  Resp 11 01/11/23 1523  SpO2 96 % 01/11/23 1523  Vitals shown include unvalidated device data.  Last Pain:  Vitals:   01/11/23 1341  TempSrc: Oral         Complications: No notable events documented.

## 2023-01-11 NOTE — Anesthesia Postprocedure Evaluation (Signed)
Anesthesia Post Note  Patient: Sarah Benjamin  Procedure(s) Performed: DILATATION AND CURETTAGE (Uterus) HYSTEROSCOPY (Uterus)     Patient location during evaluation: PACU Anesthesia Type: General Level of consciousness: awake and alert, oriented and patient cooperative Pain management: pain level controlled Vital Signs Assessment: post-procedure vital signs reviewed and stable Respiratory status: spontaneous breathing, nonlabored ventilation and respiratory function stable Cardiovascular status: blood pressure returned to baseline and stable Postop Assessment: no apparent nausea or vomiting Anesthetic complications: no   No notable events documented.  Last Vitals:  Vitals:   01/11/23 1638 01/11/23 1653  BP:  (!) 144/99  Pulse: (!) 57 71  Resp: 12 15  Temp:    SpO2: 93% 93%    Last Pain:  Vitals:   01/11/23 1618  TempSrc:   PainSc: [place]

## 2023-01-11 NOTE — Anesthesia Procedure Notes (Signed)
Procedure Name: LMA Insertion Date/Time: 01/11/2023 2:19 PM  Performed by: Carolan Clines, CRNAPre-anesthesia Checklist: Patient identified, Emergency Drugs available, Suction available and Patient being monitored Patient Re-evaluated:Patient Re-evaluated prior to induction Oxygen Delivery Method: Circle System Utilized Preoxygenation: Pre-oxygenation with 100% oxygen Induction Type: IV induction LMA: LMA inserted LMA Size: 4.0 Number of attempts: 1 Placement Confirmation: positive ETCO2 Tube secured with: Tape Dental Injury: Teeth and Oropharynx as per pre-operative assessment

## 2023-01-11 NOTE — H&P (Signed)
[facility] Obstetrics and Gynecology Attending History and Physical  Sarah Benjamin is a 52 y.o. G2P2 who presented to the office for annual and noted to have PMB. She had a TVUS which showed thickened EL (5.89m). EMB was attempted in the office but I was unable to get into her uterine cavity despite trying multiple techniques.    Past Medical History:  Diagnosis Date   Anemia    Asthma    during pollen season   Bell's palsy    as a younger woman   COVID 2020   with pneumonia   Hypertension    Hypothyroidism    Pneumonia    with Covid   Pre-diabetes    Thyroid disease    Vaginal Pap smear, abnormal    Past Surgical History:  Procedure Laterality Date   COLONOSCOPY     WISDOM TOOTH EXTRACTION     OB History  Gravida Para Term Preterm AB Living  '2 2       2  '$ SAB IAB Ectopic Multiple Live Births          2    # Outcome Date GA Lbr Len/2nd Weight Sex Delivery Anes PTL Lv  2 Para           1 Para           Patient denies any other pertinent gynecologic issues.  No current facility-administered medications on file prior to encounter.   Current Outpatient Medications on File Prior to Encounter  Medication Sig Dispense Refill   levothyroxine (SYNTHROID) 112 MCG tablet Take 1 tablet (112 mcg total) by mouth daily before breakfast. No refills. Needs labs for thyroid check 90 tablet 1   losartan-hydrochlorothiazide (HYZAAR) 50-12.5 MG tablet Take 1 tablet by mouth daily. 90 tablet 3   albuterol (VENTOLIN HFA) 108 (90 Base) MCG/ACT inhaler Inhale 2 puffs into the lungs every 4 (four) hours as needed for wheezing or shortness of breath. 2 each 1   EPINEPHrine (EPIPEN 2-PAK) 0.3 mg/0.3 mL IJ SOAJ injection Inject 0.3 mg into the muscle as needed for anaphylaxis. 1 each 0   EPINEPHrine 0.3 mg/0.3 mL IJ SOAJ injection Inject 0.3 mg into the skin as needed for anaphylaxis. (Patient not taking: Reported on 01/09/2023) 1 each 2   fluticasone (FLONASE) 50 MCG/ACT nasal spray Place 2  sprays into both nostrils daily. (Patient not taking: Reported on 01/09/2023) 16 g 6   Allergies  Allergen Reactions   Other Anaphylaxis    Kiwi, apples, grapefruit, onions, walnuts    Social History:   reports that she has never smoked. She has been exposed to tobacco smoke. She has never used smokeless tobacco. She reports that she does not drink alcohol and does not use drugs. Family History  Problem Relation Age of Onset   Diabetes Mother    Diabetes Brother    Colon cancer Neg Hx    Colon polyps Neg Hx    Esophageal cancer Neg Hx    Rectal cancer Neg Hx    Stomach cancer Neg Hx     Review of Systems: Pertinent items noted in HPI and remainder of comprehensive ROS otherwise negative.  PHYSICAL EXAM: Blood pressure 134/88, pulse 71, temperature 98.2 F (36.8 C), temperature source Oral, resp. rate 18, height '5\' 2"'$  (1.575 m), weight 95.3 kg, last menstrual period 01/27/2020, SpO2 97 %. CONSTITUTIONAL: Well-developed, well-nourished female in no acute distress.  HENT:  Normocephalic, atraumatic, External right and left ear normal. Oropharynx is clear and  moist EYES: Conjunctivae and EOM are normal. Pupils are equal, round, and reactive to light. No scleral icterus.  NECK: Normal range of motion, supple, no masses SKIN: Skin is warm and dry. No rash noted. Not diaphoretic. No erythema. No pallor. NEUROLOGIC: Alert and oriented to person, place, and time. Normal reflexes, muscle tone coordination. No cranial nerve deficit noted. PSYCHIATRIC: Normal mood and affect. Normal behavior. Normal judgment and thought content. CARDIOVASCULAR: Normal heart rate noted RESPIRATORY: Effort normal, no problems with respiration noted ABDOMEN: Soft, nontender, nondistended. PELVIC: Not examined MUSCULOSKELETAL: Normal range of motion. No tenderness.  No cyanosis, clubbing, or edema.  2+ distal pulses.  Labs: Results for orders placed or performed during the hospital encounter of 01/11/23 (from  the past 336 hour(s))  Pregnancy, urine POC   Collection Time: 01/11/23  1:37 PM  Result Value Ref Range   Preg Test, Ur NEGATIVE NEGATIVE    Imaging Studies: DG Lumbar Spine Complete  Result Date: 01/05/2023 CLINICAL DATA:  Lumbar back pain with radiculopathy affecting lower extremity. Chronic pain. EXAM: LUMBAR SPINE - COMPLETE 4+ VIEW COMPARISON:  Lumbar radiograph 03/04/2022 FINDINGS: There are 5 non-rib-bearing lumbar vertebra. Normal lumbar alignment. Normal vertebral body heights. Mild diffuse disc space narrowing most prominent in the upper lumbar spine. Anterior spurring at L1-L2 and L3-L4. Mild L4-L5 facet hypertrophy. No evidence of fracture or acute bone abnormality. No visible pars defects. No bony destructive change. Sacroiliac joints are congruent. IMPRESSION: 1. Mild multilevel spondylosis with disc space narrowing and spurring most prominent in the upper lumbar spine. 2. Mild L4-L5 facet hypertrophy. Electronically Signed   By: Keith Rake M.D.   On: 01/05/2023 12:01    Assessment: Active Problems:   Postmenopausal bleeding   Plan: - Diagnosis: PMB - Planned surgery: D/C/H  - Risks of surgery include but are not limited to: bleeding, infection, injury to surrounding organs/tissues (i.e. bowel/bladder/ureters), need for additional procedures, wound complications, hospital re-admission, and conversion to open surgery, VTE, additional complications: Uterine perforation - We discussed postop restrictions, precautions and expectations - All questions answered. Consent reviewed and signed.    Radene Gunning, MD, [place] for [facility], [place]

## 2023-01-12 ENCOUNTER — Encounter (HOSPITAL_COMMUNITY): Payer: Self-pay | Admitting: Obstetrics and Gynecology

## 2023-01-12 DIAGNOSIS — E039 Hypothyroidism, unspecified: Secondary | ICD-10-CM | POA: Diagnosis not present

## 2023-01-12 DIAGNOSIS — I10 Essential (primary) hypertension: Secondary | ICD-10-CM | POA: Diagnosis not present

## 2023-01-12 DIAGNOSIS — N950 Postmenopausal bleeding: Secondary | ICD-10-CM | POA: Diagnosis not present

## 2023-01-12 DIAGNOSIS — J45909 Unspecified asthma, uncomplicated: Secondary | ICD-10-CM | POA: Diagnosis not present

## 2023-01-12 DIAGNOSIS — Z7722 Contact with and (suspected) exposure to environmental tobacco smoke (acute) (chronic): Secondary | ICD-10-CM | POA: Diagnosis not present

## 2023-01-12 DIAGNOSIS — Z79899 Other long term (current) drug therapy: Secondary | ICD-10-CM | POA: Diagnosis not present

## 2023-01-12 DIAGNOSIS — Z8616 Personal history of COVID-19: Secondary | ICD-10-CM | POA: Diagnosis not present

## 2023-01-12 LAB — SURGICAL PATHOLOGY

## 2023-01-12 LAB — CBC
HCT: 43.8 % (ref 36.0–46.0)
Hemoglobin: 14.8 g/dL (ref 12.0–15.0)
MCH: 30 pg (ref 26.0–34.0)
MCHC: 33.8 g/dL (ref 30.0–36.0)
MCV: 88.8 fL (ref 80.0–100.0)
Platelets: 200 10*3/uL (ref 150–400)
RBC: 4.93 MIL/uL (ref 3.87–5.11)
RDW: 14.6 % (ref 11.5–15.5)
WBC: 9.6 10*3/uL (ref 4.0–10.5)
nRBC: 0 % (ref 0.0–0.2)

## 2023-01-12 MED ORDER — PROCHLORPERAZINE MALEATE 10 MG PO TABS: 10.0000 mg | ORAL_TABLET | Freq: Four times a day (QID) | ORAL | 0 refills | Status: DC | PRN

## 2023-01-12 MED ORDER — OXYCODONE HCL 5 MG PO TABS: 5.0000 mg | ORAL_TABLET | ORAL | 0 refills | Status: DC | PRN

## 2023-01-12 NOTE — Discharge Summary (Signed)
Gynecology Physician Postoperative Discharge Summary  Patient ID: Sarah Benjamin MRN: [id number] DOB/AGE: [birth year] 52 y.o.  Admit Date: 01/11/2023 Discharge Date: 01/12/2023  Preoperative Diagnoses: PMB  Procedures: Procedure(s) (LRB): DILATATION AND CURETTAGE (N/A) HYSTEROSCOPY (N/A)  Hospital Course:  Sarah Benjamin is a 52 y.o. G2P2  admitted for scheduled surgery.  She underwent the procedures as mentioned above, her operation was uncomplicated but I did try to pull on the left essure coil that was largely protruding outside of her ostia. For further details about surgery, please refer to the operative report. Patient had an uncomplicated postoperative course except that she had trouble with pain control. Her response to the pain medication was some respiratory depression and she was holding her breath and thus her O2 sats were sometimes in the 80s. I admitted her for overnight observation for these reasons. By time of discharge on POD#1, her pain was controlled on oral pain medications. She notes headache is her primary concern but she has been woke a lot overnight with vitals and may find some relief being home. She does have relief from the pain medications when she takes them.  She was ambulating, voiding without difficulty, tolerating regular diet and passing flatus. She was deemed stable for discharge to home.   We did discuss the finding of the coil. She notes she has had left leg and groin pain for some years now (coils placed about 10 years ago) and now wonders if could be related (it could). We discussed options for observation vs hysterectomy given that the coil is the likely cause of her PMB. She is leaning toward hysterectomy in July when I would come back but we discussed if issues prior we can have her surgery with another surgeon. We discussed we would discuss in more detail once I have final pathology but I expect that to be benign. I showed her the pictures from her surgery as  well.   Significant Labs:    Latest Ref Rng & Units 01/12/2023    2:20 AM 01/11/2023    2:02 PM 10/12/2022   11:38 AM  CBC  WBC 4.0 - 10.5 K/uL 9.6   5.7   Hemoglobin 12.0 - 15.0 g/dL 14.8  15.6  15.1   Hematocrit 36.0 - 46.0 % 43.8  46.0  46.4   Platelets 150 - 400 K/uL 200   194     Discharge Exam: Blood pressure 118/77, pulse 61, temperature 98.1 F (36.7 C), temperature source Oral, resp. rate 18, height '5\' 2"'$  (1.575 m), weight 95.3 kg, last menstrual period 01/27/2020, SpO2 95 %. -- All vitals reviewed and normal overnight.   General appearance: alert and no distress  Resp: Normal respiratory effort Cardio: regular rate GI: soft, non-tender; bowel sounds normal; no masses, no organomegaly.  Pelvic: not examined Extremities: extremities normal, atraumatic, no cyanosis or edema and Homans sign is negative, no sign of DVT  Discharged Condition: Stable  Disposition: Discharge disposition: 01-Home or Self Care       Discharge Instructions     Activity as tolerated - No restrictions   Complete by: As directed    Activity as tolerated - No restrictions   Complete by: As directed    Call MD for:  difficulty breathing, headache or visual disturbances   Complete by: As directed    Call MD for:  difficulty breathing, headache or visual disturbances   Complete by: As directed    Call MD for:  persistant nausea and vomiting  Complete by: As directed    Call MD for:  persistant nausea and vomiting   Complete by: As directed    Call MD for:  redness, tenderness, or signs of infection (pain, swelling, redness, odor or green/yellow discharge around incision site)   Complete by: As directed    Call MD for:  redness, tenderness, or signs of infection (pain, swelling, redness, odor or green/yellow discharge around incision site)   Complete by: As directed    Call MD for:  severe uncontrolled pain   Complete by: As directed    Call MD for:  severe uncontrolled pain   Complete  by: As directed    Call MD for:  temperature >100.4   Complete by: As directed    Call MD for:  temperature >100.4   Complete by: As directed    Diet - low sodium heart healthy   Complete by: As directed    Driving Restrictions   Complete by: As directed    When not taking narcotic pain medication and would not hesitate to use the breaks, usually about 7 days   Driving Restrictions   Complete by: As directed    When not taking narcotic pain medication and would not hesitate to use the breaks, usually about 7 days   May shower / Bathe   Complete by: As directed    May shower / Bathe   Complete by: As directed    No wound care   Complete by: As directed    No wound care   Complete by: As directed       Allergies as of 01/12/2023       Reactions   Other Anaphylaxis   Kiwi, apples, grapefruit, onions, walnuts        Medication List     STOP taking these medications    fluticasone 50 MCG/ACT nasal spray Commonly known as: FLONASE   predniSONE 50 MG tablet Commonly known as: DELTASONE       TAKE these medications    acetaminophen 500 MG tablet Commonly known as: TYLENOL Take 1 tablet (500 mg total) by mouth every 6 (six) hours as needed.   albuterol 108 (90 Base) MCG/ACT inhaler Commonly known as: VENTOLIN HFA Inhale 2 puffs into the lungs every 4 (four) hours as needed for wheezing or shortness of breath.   EPINEPHrine 0.3 mg/0.3 mL Soaj injection Commonly known as: EpiPen 2-Pak Inject 0.3 mg into the muscle as needed for anaphylaxis. What changed: Another medication with the same name was removed. Continue taking this medication, and follow the directions you see here.   ibuprofen 800 MG tablet Commonly known as: ADVIL Take 1 tablet (800 mg total) by mouth 3 (three) times daily with meals as needed for headache, moderate pain or cramping.   levothyroxine 112 MCG tablet Commonly known as: SYNTHROID Take 1 tablet (112 mcg total) by mouth daily before  breakfast. No refills. Needs labs for thyroid check   losartan-hydrochlorothiazide 50-12.5 MG tablet Commonly known as: HYZAAR Take 1 tablet by mouth daily.   oxyCODONE 5 MG immediate release tablet Commonly known as: Roxicodone Take 1 tablet (5 mg total) by mouth every 4 (four) hours as needed for severe pain.   prochlorperazine 10 MG tablet Commonly known as: COMPAZINE Take 1 tablet (10 mg total) by mouth every 6 (six) hours as needed for nausea or vomiting.       Future Appointments  Date Time Provider [place]  01/14/2023  8:00 AM MKV- MRI  MOBILE UNIT MK[facility]-MRI [place] for [facility] Follow up.   Specialty: Obstetrics and Gynecology Why: As needed Contact information: [place], [place] [phone]                Total discharge time: 30 minutes   Signed:  Radene Gunning, MD, [place] Attending [place], [facility]

## 2023-01-14 ENCOUNTER — Ambulatory Visit (INDEPENDENT_AMBULATORY_CARE_PROVIDER_SITE_OTHER): Payer: BC Managed Care – PPO

## 2023-01-14 ENCOUNTER — Telehealth: Payer: Self-pay | Admitting: Obstetrics and Gynecology

## 2023-01-14 DIAGNOSIS — M5126 Other intervertebral disc displacement, lumbar region: Secondary | ICD-10-CM | POA: Diagnosis not present

## 2023-01-14 DIAGNOSIS — M5416 Radiculopathy, lumbar region: Secondary | ICD-10-CM

## 2023-01-14 NOTE — Telephone Encounter (Signed)
Discussed pathology - reviewed it is as expected since lining atrophic. We reviewed etiology for bleeding would be the coil.   Since her surgery, she has overall improved but has some days with more cramping. She has reviewed the information on hysterectomy from her own search and would like to proceed in July as we had discussed.  - New literature supports the removal of ovaries after age 51 at the time of hysterectomy whereas prior literature suggested age 51.  - For women below age 22 at the time of hysterectomy and who wish to have BSO, the increased risk of CVD can be mitigated by estrogen therapy alone but she does not wish to have hormonal therapy if she can avoid it.  - We discussed the symptomatic component of surgical menopause and the associated symptoms many of which can be debilitating for women I.e. poor sleep, mood swings, hot flashes, night sweats, etc. She would like to avoid hormone therapy.  - We discussed the risk of ovarian cancer without family history is overall low. Reviewed the more recent literature that suggests ovarian cancer may actually originate from the tubes.  -  Following our discussion, the patient had the opportunity to ask questions and she would like to keep her ovaries.   - Diagnosis: PMB due to essure coil in her uterus - Planned surgery: TLH, salpingectomy, and cystoscopy - Risks of surgery include but are not limited to:  Bleeding - Can bleed enough to need transfusion or need for additional surgeries I.e. conversation to open surgery.  Infection - The vagina can develop cuff infection Injury to surrounding organs/tissues (i.e. bowel/bladder/ureters)  Need for additional procedures - Would be specific to a complication or injury Wound complications - No specific wound unless converted to open surgery or laparoscopic Hospital re-admission - In the event of a delayed complication being recognized I.e. ureteral injury discussed Conversion to open surgery   VTE - Discussed risk of blood clots following delivery We also discussed risk of prolapse following hysterectomy or urinary symptoms and the benefits of PFPT should she develop symptoms.  - We discussed alternative to hysterectomy would be expectant management. She decliens this option.  - We discussed postop restrictions, precautions and expectations - All questions answered.   Our plan will be to schedule surgery in July as we discussed but we will do a preop visit in June. She knows if any issues prior to that and surgery needs to be moved up, her case has been discussed with Dr. Mikel Cella.   Radene Gunning, MD Attending [place], [facility] for [facility], [place]

## 2023-01-23 ENCOUNTER — Encounter: Payer: Self-pay | Admitting: Sports Medicine

## 2023-01-23 ENCOUNTER — Ambulatory Visit: Payer: BC Managed Care – PPO | Admitting: Sports Medicine

## 2023-01-23 DIAGNOSIS — M5416 Radiculopathy, lumbar region: Secondary | ICD-10-CM | POA: Diagnosis not present

## 2023-01-23 NOTE — Assessment & Plan Note (Signed)
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 She has improved, she is also going to leave the job that tends to hurt her back, ibuprofen 800 tends to resolve her symptoms, so for this reason we will avoid any intervention. I want her to get very aggressive with her core conditioning, and she can return to see me as needed.

## 2023-01-23 NOTE — Progress Notes (Signed)
    Procedures performed today:    None.  Independent interpretation of notes and tests performed by another provider:   I did personally review a lumbar spine MRI that shows very mild L4-L5 DDD with very mild spinal stenosis.  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 She has improved, she is also going to leave the job that tends to hurt her back, ibuprofen 800 tends to resolve her symptoms, so for this reason we will avoid any intervention. I want her to get very aggressive with her core conditioning, and she can return to see me as needed.    ____________________________________________ Sarah Benjamin, M.D., ABFM., CAQSM., AME. Primary Care and Sports Medicine [facility]  Adjunct Professor of [place]  Risk manager

## 2023-02-01 ENCOUNTER — Encounter: Payer: Self-pay | Admitting: Family Medicine

## 2023-02-28 ENCOUNTER — Telehealth: Payer: Self-pay

## 2023-02-28 NOTE — Telephone Encounter (Signed)
Called patient to discuss potential surgery dates, no answer, left voicemail notifying patient I will secure her on first available 6/26 and to give me a call back if that day does not work for her.

## 2023-03-23 ENCOUNTER — Telehealth: Payer: Self-pay | Admitting: *Deleted

## 2023-03-23 NOTE — Telephone Encounter (Signed)
Left patient a message to call and schedule June Pre Op with Dr. Para March for July surgery. As of this time today Dr. Para March has available appointments on 05/11/2023.

## 2023-03-30 ENCOUNTER — Telehealth: Payer: Self-pay | Admitting: *Deleted

## 2023-03-30 NOTE — Telephone Encounter (Signed)
Left patient an urgent message with appointment information for 05/03/2023 at [facility].

## 2023-04-05 ENCOUNTER — Telehealth: Payer: Self-pay | Admitting: *Deleted

## 2023-04-05 NOTE — Telephone Encounter (Signed)
Followed up with patient about scheduled appointment on 05/03/2023 at [facility]. Patient stated that she will be there and has appointment information on MyChart.

## 2023-04-19 ENCOUNTER — Other Ambulatory Visit: Payer: Self-pay

## 2023-04-19 ENCOUNTER — Encounter (HOSPITAL_BASED_OUTPATIENT_CLINIC_OR_DEPARTMENT_OTHER): Payer: Self-pay | Admitting: Obstetrics and Gynecology

## 2023-04-19 DIAGNOSIS — Z01812 Encounter for preprocedural laboratory examination: Secondary | ICD-10-CM | POA: Diagnosis not present

## 2023-04-19 NOTE — Progress Notes (Addendum)
Spoke w/ via phone for pre-op interview---[facility] Lab needs dos----none per anesthesia, surgeon orders pending               Lab results------05/08/23 lab appt for cbc, type & screen, bmp, 01/11/23 EKG in chart & Epic, 02/2018 Echo in Care Everywhere EF=60-65% COVID test -----patient states asymptomatic no test needed Arrive at -------0845 on Wednesday, 05/10/23 NPO after MN NO Solid Food.  Clear liquids from MN until---0745 Med rec completed Medications to take morning of surgery -----Levothyroxine Diabetic medication -----n/a Patient instructed no nail polish to be worn day of surgery Patient instructed to bring photo id and insurance card day of surgery Patient aware to have Driver (ride ) / caregiver    for 24 hours after surgery - daughter, Roanna Raider Patient Special Instructions -----Extended / overnight stay instructions given. Pre-Op special Instructions -----Requested orders from Dr. Para March via Epic IB on 04/19/23. Patient verbalized understanding of instructions that were given at this phone interview. Patient denies shortness of breath, chest pain, fever, cough at this phone interview.  Patient speaks [place] and [place]. She is fluent in [place]. An interpreter was offered for day of surgery but patient declined.

## 2023-04-19 NOTE — Progress Notes (Signed)
Your procedure is scheduled on Wednesday, 05/10/23  Report to [facility] AT  8:45  AM.   Call this number if you have problems the morning of surgery  :[phone].   OUR ADDRESS IS [address].  WE ARE LOCATED IN [facility].  PLEASE BRING YOUR INSURANCE CARD AND PHOTO ID DAY OF SURGERY.  ONLY 2 PEOPLE ARE ALLOWED IN  WAITING  ROOM                                      REMEMBER:  DO NOT EAT FOOD, CANDY GUM OR MINTS  AFTER MIDNIGHT THE NIGHT BEFORE YOUR SURGERY . YOU MAY HAVE CLEAR LIQUIDS FROM MIDNIGHT THE NIGHT BEFORE YOUR SURGERY UNTIL  7:45 AM. NO CLEAR LIQUIDS AFTER  7:45 AM DAY OF SURGERY.  YOU MAY  BRUSH YOUR TEETH MORNING OF SURGERY AND RINSE YOUR MOUTH OUT, NO CHEWING GUM CANDY OR MINTS.     CLEAR LIQUID DIET    Allowed      Water                                                                   Coffee and tea, regular and decaf  (NO cream or milk products of any type, may sweeten)                         Carbonated beverages, regular and diet                                    Sports drinks like Gatorade _____________________________________________________________________     TAKE ONLY THESE MEDICATIONS MORNING OF SURGERY: Levothyroxine                                        DO NOT WEAR JEWERLY/  METAL/  PIERCINGS (INCLUDING NO PLASTIC PIERCINGS) DO NOT WEAR LOTIONS, POWDERS, PERFUMES OR NAIL POLISH ON YOUR FINGERNAILS. TOENAIL POLISH IS OK TO WEAR. DO NOT SHAVE FOR 48 HOURS PRIOR TO DAY OF SURGERY.  CONTACTS, GLASSES, OR DENTURES MAY NOT BE WORN TO SURGERY.  REMEMBER: NO SMOKING, VAPING ,  DRUGS OR ALCOHOL FOR 24 HOURS BEFORE YOUR SURGERY.                                    [facility] IS NOT RESPONSIBLE  FOR ANY BELONGINGS.                                                                    Marland Kitchen           [facility] - Preparing for Surgery Before surgery,  you can play an important role.  Because skin is not  sterile, your skin needs to be as free of germs as possible.  You can reduce the number of germs on your skin by washing with CHG (chlorahexidine gluconate) soap before surgery.  CHG is an antiseptic cleaner which kills germs and bonds with the skin to continue killing germs even after washing. Please DO NOT use if you have an allergy to CHG or antibacterial soaps.  If your skin becomes reddened/irritated stop using the CHG and inform your nurse when you arrive at Short Stay. Do not shave (including legs and underarms) for at least 48 hours prior to the first CHG shower.  You may shave your face/neck. Please follow these instructions carefully:  1.  Shower with CHG Soap the night before surgery and the  morning of Surgery.  2.  If you choose to wash your hair, wash your hair first as usual with your  normal  shampoo.  3.  After you shampoo, rinse your hair and body thoroughly to remove the  shampoo.                                        4.  Use CHG as you would any other liquid soap.  You can apply chg directly  to the skin and wash , chg soap provided, night before and morning of your surgery.  5.  Apply the CHG Soap to your body ONLY FROM THE NECK DOWN.   Do not use on face/ open                           Wound or open sores. Avoid contact with eyes, ears mouth and genitals (private parts).                       Wash face,  Genitals (private parts) with your normal soap.             6.  Wash thoroughly, paying special attention to the area where your surgery  will be performed.  7.  Thoroughly rinse your body with warm water from the neck down.  8.  DO NOT shower/wash with your normal soap after using and rinsing off  the CHG Soap.             9.  Pat yourself dry with a clean towel.            10.  Wear clean pajamas.            11.  Place clean sheets on your bed the night of your first shower and do not  sleep with pets. Day of Surgery : Do not apply any lotions/ powders the morning of  surgery.  Please wear clean clothes to the hospital/surgery center.  IF YOU HAVE ANY SKIN IRRITATION OR PROBLEMS WITH THE SURGICAL SOAP, PLEASE GET A BAR OF GOLD DIAL SOAP AND SHOWER THE NIGHT BEFORE YOUR SURGERY AND THE MORNING OF YOUR SURGERY. PLEASE LET THE NURSE KNOW MORNING OF YOUR SURGERY IF YOU HAD ANY PROBLEMS WITH THE SURGICAL SOAP.   YOUR SURGEON MAY HAVE REQUESTED EXTENDED RECOVERY TIME AFTER YOUR SURGERY. IT COULD BE A  JUST A FEW HOURS  UP TO AN OVERNIGHT STAY.  YOUR SURGEON SHOULD HAVE DISCUSSED THIS WITH YOU PRIOR TO YOUR SURGERY.  IN THE EVENT YOU NEED TO STAY OVERNIGHT PLEASE REFER TO THE FOLLOWING GUIDELINES. YOU MAY HAVE UP TO 4 VISITORS  MAY VISIT IN THE EXTENDED RECOVERY ROOM UNTIL 800 PM ONLY.  ONE  VISITOR AGE 51 AND OVER MAY SPEND THE NIGHT AND MUST BE IN EXTENDED RECOVERY ROOM NO LATER THAN 800 PM . YOUR DISCHARGE TIME AFTER YOU SPEND THE NIGHT IS 900 AM THE MORNING AFTER YOUR SURGERY. YOU MAY PACK A SMALL OVERNIGHT BAG WITH TOILETRIES FOR YOUR OVERNIGHT STAY IF YOU WISH.  REGARDLESS OF IF YOU STAY OVER NIGHT OR ARE DISCHARGED THE SAME DAY YOU WILL BE REQUIRED TO HAVE A RESPONSIBLE ADULT (18 YRS OLD OR OLDER) STAY WITH YOU FOR AT LEAST THE FIRST 24 HOURS  YOUR PRESCRIPTION MEDICATIONS WILL BE PROVIDED DURING YOUR HOSPITAL STAY.  ________________________________________________________________________                                                        QUESTIONS Mechele Claude PRE OP NURSE PHONE [phone].

## 2023-04-28 ENCOUNTER — Other Ambulatory Visit: Payer: Self-pay | Admitting: Obstetrics and Gynecology

## 2023-04-28 DIAGNOSIS — N950 Postmenopausal bleeding: Secondary | ICD-10-CM

## 2023-04-28 NOTE — Progress Notes (Signed)
OR orders entered

## 2023-05-03 ENCOUNTER — Ambulatory Visit (INDEPENDENT_AMBULATORY_CARE_PROVIDER_SITE_OTHER): Payer: BC Managed Care – PPO | Admitting: Obstetrics and Gynecology

## 2023-05-03 ENCOUNTER — Encounter: Payer: Self-pay | Admitting: Obstetrics and Gynecology

## 2023-05-03 VITALS — BP 143/83 | HR 63 | Ht 62.0 in | Wt 219.0 lb

## 2023-05-03 DIAGNOSIS — Z01818 Encounter for other preprocedural examination: Secondary | ICD-10-CM

## 2023-05-03 NOTE — Progress Notes (Signed)
GYNECOLOGY OFFICE VISIT NOTE  History:   Sarah Benjamin is a 52 y.o. G2P2 here today for preop discussion of TLH, salpingectomy and cystoscopy.    She has had continued bleeding and cramping due to essure coil coming out of her tube. She is hoping for confirmation of her other coil being in her tube.   She would like to keep her ovaries if normal in appearance but if abnormal, then she would like them removed.     Past Medical History:  Diagnosis Date   Asthma    during pollen season, follows w/ PCP   Bell's palsy    as a younger woman   Chest pain    12/09/20 cardiology visit with Juliane Lack, MD. Pain thought to be musculoskeletal in nature / no furthing testing.   Complication of anesthesia    12/2022 - Patient stated that her O2 level dropped after surgery and she had to stay in the hospital overnight. Per MD note, patient had respiratory depression due to pain medication and was kept overnight.   COVID 2020   with pneumonia   Heart murmur    no problems from murmur per pt   History of PCOS    Hypertension    Follows w/ PCP.   Hypothyroidism    Follows w/ PCP.   Lumbar back pain    mild L4 - L5 DDD, very mild spinal stenosis   Paresthesia and pain of extremity    right hand - carpal tunnel syndrome   Pneumonia 2020   with Covid   Pre-diabetes    Vaginal Pap smear, abnormal    Wears glasses     Past Surgical History:  Procedure Laterality Date   COLONOSCOPY  01/14/2022   diverticula in sigmoid colon, 2 sessile polyps   ENDOMETRIAL ABLATION  2010   ESSURE TUBAL LIGATION Left    around 2011   HYSTEROSCOPY N/A 01/11/2023   Procedure: HYSTEROSCOPY;  Surgeon: Milas Hock, MD;  Location: [facility] OR;  Service: Gynecology;  Laterality: N/A;   HYSTEROSCOPY WITH D & C N/A 01/11/2023   Procedure: DILATATION AND CURETTAGE;  Surgeon: Milas Hock, MD;  Location: [facility] OR;  Service: Gynecology;  Laterality: N/A;   WISDOM TOOTH EXTRACTION      The following portions of the  patient's history were reviewed and updated as appropriate: allergies, current medications, past family history, past medical history, past social history, past surgical history and problem list.   Health Maintenance:   Diagnosis  Date Value Ref Range Status  10/20/2022   Final   - Negative for intraepithelial lesion or malignancy (NILM)    Review of Systems:  Pertinent items noted in HPI and remainder of comprehensive ROS otherwise negative.  Physical Exam:  BP (!) 143/83   Pulse 63   Ht 5\' 2"  (1.575 m)   Wt 219 lb (99.3 kg)   LMP 01/27/2020   BMI 40.06 kg/m  CONSTITUTIONAL: Well-developed, well-nourished female in no acute distress.  HEENT:  Normocephalic, atraumatic. External right and left ear normal. No scleral icterus.  NECK: Normal range of motion, supple, no masses noted on observation SKIN: No rash noted. Not diaphoretic. No erythema. No pallor. MUSCULOSKELETAL: Normal range of motion. No edema noted. NEUROLOGIC: Alert and oriented to person, place, and time. Normal muscle tone coordination. No cranial nerve deficit noted. PSYCHIATRIC: Normal mood and affect. Normal behavior. Normal judgment and thought content.  PELVIC: Deferred  Labs and Imaging No results found for this or any previous  visit (from the past 168 hour(s)). No results found.  Assessment and Plan:   1. Preop examination - Diagnosis: PMB due to essure coil in her uterus - Planned surgery: TLH, salpingectomy, and cystoscopy - Risks of surgery reviewed again from prior telephone call.   - We discussed alternative to hysterectomy would be expectant management. She declines this option.  - We discussed postop restrictions, precautions and expectations - All questions answered.    Routine preventative health maintenance measures emphasized. Please refer to After Visit Summary for other counseling recommendations.   No follow-ups on file.  Milas Hock, MD, FACOG Obstetrician & Gynecologist, [facility] for [facility], [facility]

## 2023-05-03 NOTE — H&P (View-Only) (Signed)
 GYNECOLOGY OFFICE VISIT NOTE  History:   Sarah Benjamin is a 52 y.o. G2P2 here today for preop discussion of TLH, salpingectomy and cystoscopy.    She has had continued bleeding and cramping due to essure coil coming out of her tube. She is hoping for confirmation of her other coil being in her tube.   She would like to keep her ovaries if normal in appearance but if abnormal, then she would like them removed.     Past Medical History:  Diagnosis Date   Asthma    during pollen season, follows w/ PCP   Bell's palsy    as a younger woman   Chest pain    12/09/20 cardiology visit with Robert Preli, MD. Pain thought to be musculoskeletal in nature / no furthing testing.   Complication of anesthesia    12/2022 - Patient stated that her O2 level dropped after surgery and she had to stay in the hospital overnight. Per MD note, patient had respiratory depression due to pain medication and was kept overnight.   COVID 2020   with pneumonia   Heart murmur    no problems from murmur per pt   History of PCOS    Hypertension    Follows w/ PCP.   Hypothyroidism    Follows w/ PCP.   Lumbar back pain    mild L4 - L5 DDD, very mild spinal stenosis   Paresthesia and pain of extremity    right hand - carpal tunnel syndrome   Pneumonia 2020   with Covid   Pre-diabetes    Vaginal Pap smear, abnormal    Wears glasses     Past Surgical History:  Procedure Laterality Date   COLONOSCOPY  01/14/2022   diverticula in sigmoid colon, 2 sessile polyps   ENDOMETRIAL ABLATION  2010   ESSURE TUBAL LIGATION Left    around 2011   HYSTEROSCOPY N/A 01/11/2023   Procedure: HYSTEROSCOPY;  Surgeon: Joaquin Knebel, MD;  Location: [facility] OR;  Service: Gynecology;  Laterality: N/A;   HYSTEROSCOPY WITH D & C N/A 01/11/2023   Procedure: DILATATION AND CURETTAGE;  Surgeon: Jillienne Egner, MD;  Location: [facility] OR;  Service: Gynecology;  Laterality: N/A;   WISDOM TOOTH EXTRACTION      The following portions of the  patient's history were reviewed and updated as appropriate: allergies, current medications, past family history, past medical history, past social history, past surgical history and problem list.   Health Maintenance:   Diagnosis  Date Value Ref Range Status  10/20/2022   Final   - Negative for intraepithelial lesion or malignancy (NILM)    Review of Systems:  Pertinent items noted in HPI and remainder of comprehensive ROS otherwise negative.  Physical Exam:  BP (!) 143/83   Pulse 63   Ht 5' 2" (1.575 m)   Wt 219 lb (99.3 kg)   LMP 01/27/2020   BMI 40.06 kg/m  CONSTITUTIONAL: Well-developed, well-nourished female in no acute distress.  HEENT:  Normocephalic, atraumatic. External right and left ear normal. No scleral icterus.  NECK: Normal range of motion, supple, no masses noted on observation SKIN: No rash noted. Not diaphoretic. No erythema. No pallor. MUSCULOSKELETAL: Normal range of motion. No edema noted. NEUROLOGIC: Alert and oriented to person, place, and time. Normal muscle tone coordination. No cranial nerve deficit noted. PSYCHIATRIC: Normal mood and affect. Normal behavior. Normal judgment and thought content.  PELVIC: Deferred  Labs and Imaging No results found for this or any previous   visit (from the past 168 hour(s)). No results found.  Assessment and Plan:   1. Preop examination - Diagnosis: PMB due to essure coil in her uterus - Planned surgery: TLH, salpingectomy, and cystoscopy - Risks of surgery reviewed again from prior telephone call.   - We discussed alternative to hysterectomy would be expectant management. She declines this option.  - We discussed postop restrictions, precautions and expectations - All questions answered.    Routine preventative health maintenance measures emphasized. Please refer to After Visit Summary for other counseling recommendations.   No follow-ups on file.  Enaya Howze, MD, FACOG Obstetrician & Gynecologist, Faculty  [facility], [facility]      

## 2023-05-08 ENCOUNTER — Encounter (HOSPITAL_COMMUNITY)
Admission: RE | Admit: 2023-05-08 | Discharge: 2023-05-08 | Disposition: A | Discharge status: Home / Self Care | Payer: BC Managed Care – PPO | Source: Ambulatory Visit | Attending: Obstetrics and Gynecology | Admitting: Obstetrics and Gynecology

## 2023-05-08 DIAGNOSIS — Z01812 Encounter for preprocedural laboratory examination: Secondary | ICD-10-CM | POA: Diagnosis not present

## 2023-05-08 DIAGNOSIS — Z01818 Encounter for other preprocedural examination: Secondary | ICD-10-CM

## 2023-05-08 LAB — TYPE AND SCREEN

## 2023-05-08 LAB — CBC
HCT: 45.7 % (ref 36.0–46.0)
Hemoglobin: 14.4 g/dL (ref 12.0–15.0)
MCH: 28.8 pg (ref 26.0–34.0)
MCHC: 31.5 g/dL (ref 30.0–36.0)
MCV: 91.4 fL (ref 80.0–100.0)
Platelets: 203 10*3/uL (ref 150–400)
RBC: 5 MIL/uL (ref 3.87–5.11)
RDW: 14.3 % (ref 11.5–15.5)
WBC: 6.4 10*3/uL (ref 4.0–10.5)
nRBC: 0 % (ref 0.0–0.2)

## 2023-05-08 LAB — BASIC METABOLIC PANEL
Anion gap: 7 (ref 5–15)
BUN: 18 mg/dL (ref 6–20)
CO2: 26 mmol/L (ref 22–32)
Calcium: 8.7 mg/dL — ABNORMAL LOW (ref 8.9–10.3)
Chloride: 104 mmol/L (ref 98–111)
Creatinine, Ser: 0.73 mg/dL (ref 0.44–1.00)
GFR, Estimated: >60 mL/min (ref >60)
Glucose, Bld: 111 mg/dL — ABNORMAL HIGH (ref 70–99)
Potassium: 4 mmol/L (ref 3.5–5.1)
Sodium: 137 mmol/L (ref 135–145)

## 2023-05-10 ENCOUNTER — Other Ambulatory Visit: Payer: Self-pay

## 2023-05-10 ENCOUNTER — Encounter (HOSPITAL_BASED_OUTPATIENT_CLINIC_OR_DEPARTMENT_OTHER): Payer: Self-pay | Admitting: Obstetrics and Gynecology

## 2023-05-10 ENCOUNTER — Encounter (HOSPITAL_BASED_OUTPATIENT_CLINIC_OR_DEPARTMENT_OTHER)
Admission: RE | Disposition: A | Discharge status: Home / Self Care | Payer: Self-pay | Source: Ambulatory Visit | Attending: Obstetrics and Gynecology

## 2023-05-10 ENCOUNTER — Ambulatory Visit (HOSPITAL_BASED_OUTPATIENT_CLINIC_OR_DEPARTMENT_OTHER): Payer: BC Managed Care – PPO | Admitting: Anesthesiology

## 2023-05-10 ENCOUNTER — Ambulatory Visit (HOSPITAL_BASED_OUTPATIENT_CLINIC_OR_DEPARTMENT_OTHER)
Admission: RE | Admit: 2023-05-10 | Discharge: 2023-05-11 | Disposition: A | Discharge status: Home / Self Care | Payer: BC Managed Care – PPO | Source: Ambulatory Visit | Attending: Obstetrics and Gynecology | Admitting: Obstetrics and Gynecology

## 2023-05-10 DIAGNOSIS — Z6839 Body mass index (BMI) 39.0-39.9, adult: Secondary | ICD-10-CM | POA: Diagnosis not present

## 2023-05-10 DIAGNOSIS — Z79899 Other long term (current) drug therapy: Secondary | ICD-10-CM | POA: Diagnosis not present

## 2023-05-10 DIAGNOSIS — J45909 Unspecified asthma, uncomplicated: Secondary | ICD-10-CM | POA: Insufficient documentation

## 2023-05-10 DIAGNOSIS — E039 Hypothyroidism, unspecified: Secondary | ICD-10-CM | POA: Diagnosis not present

## 2023-05-10 DIAGNOSIS — E669 Obesity, unspecified: Secondary | ICD-10-CM | POA: Diagnosis not present

## 2023-05-10 DIAGNOSIS — N858 Other specified noninflammatory disorders of uterus: Secondary | ICD-10-CM | POA: Diagnosis not present

## 2023-05-10 DIAGNOSIS — I10 Essential (primary) hypertension: Secondary | ICD-10-CM | POA: Insufficient documentation

## 2023-05-10 DIAGNOSIS — M1990 Unspecified osteoarthritis, unspecified site: Secondary | ICD-10-CM | POA: Diagnosis not present

## 2023-05-10 DIAGNOSIS — N950 Postmenopausal bleeding: Secondary | ICD-10-CM | POA: Insufficient documentation

## 2023-05-10 DIAGNOSIS — D259 Leiomyoma of uterus, unspecified: Secondary | ICD-10-CM | POA: Diagnosis not present

## 2023-05-10 DIAGNOSIS — Z01818 Encounter for other preprocedural examination: Secondary | ICD-10-CM

## 2023-05-10 HISTORY — DX: Cardiac murmur, unspecified: R01.1

## 2023-05-10 HISTORY — DX: Low back pain, unspecified: M54.50

## 2023-05-10 HISTORY — DX: Pain in unspecified limb: M79.609

## 2023-05-10 HISTORY — DX: Other complications of anesthesia, initial encounter: T88.59XA

## 2023-05-10 HISTORY — DX: Presence of spectacles and contact lenses: Z97.3

## 2023-05-10 HISTORY — DX: Chest pain, unspecified: R07.9

## 2023-05-10 HISTORY — DX: Pain in unspecified limb: R20.2

## 2023-05-10 HISTORY — DX: Personal history of other diseases of the female genital tract: Z87.42

## 2023-05-10 HISTORY — PX: CYSTOSCOPY: SHX5120

## 2023-05-10 HISTORY — PX: TOTAL LAPAROSCOPIC HYSTERECTOMY WITH SALPINGECTOMY: SHX6742

## 2023-05-10 LAB — ABO/RH: ABO/RH(D): O POS

## 2023-05-10 LAB — TYPE AND SCREEN: Antibody Screen: NEGATIVE

## 2023-05-10 LAB — HEMOGLOBIN: Hemoglobin: 14.2 g/dL (ref 12.0–15.0)

## 2023-05-10 SURGERY — HYSTERECTOMY, TOTAL, LAPAROSCOPIC, WITH SALPINGECTOMY
Anesthesia: General | Site: Bladder

## 2023-05-10 MED ORDER — MIDAZOLAM HCL 2 MG/2ML IJ SOLN
INTRAMUSCULAR | Status: AC
  Filled 2023-05-10: qty 2

## 2023-05-10 MED ORDER — PROPOFOL 10 MG/ML IV BOLUS
INTRAVENOUS | Status: AC
  Filled 2023-05-10: qty 20

## 2023-05-10 MED ORDER — SUGAMMADEX SODIUM 200 MG/2ML IV SOLN
INTRAVENOUS | Status: DC | PRN
  Administered 2023-05-10: 200 mg via INTRAVENOUS

## 2023-05-10 MED ORDER — HYDROMORPHONE HCL 1 MG/ML IJ SOLN
INTRAMUSCULAR | Status: AC
  Filled 2023-05-10: qty 1

## 2023-05-10 MED ORDER — FENTANYL CITRATE (PF) 100 MCG/2ML IJ SOLN
INTRAMUSCULAR | Status: AC
  Filled 2023-05-10: qty 2

## 2023-05-10 MED ORDER — OXYCODONE HCL 5 MG/5ML PO SOLN: 5.0000 mg | Freq: Once | ORAL | Status: DC | PRN

## 2023-05-10 MED ORDER — MENTHOL 3 MG MT LOZG: 1.0000 | LOZENGE | OROMUCOSAL | Status: DC | PRN

## 2023-05-10 MED ORDER — KETOROLAC TROMETHAMINE 30 MG/ML IJ SOLN
INTRAMUSCULAR | Status: AC
  Filled 2023-05-10: qty 1

## 2023-05-10 MED ORDER — OXYCODONE HCL 5 MG PO TABS
10.0000 mg | ORAL_TABLET | Freq: Once | ORAL | Status: AC
  Administered 2023-05-10: 10 mg via ORAL

## 2023-05-10 MED ORDER — AMISULPRIDE (ANTIEMETIC) 5 MG/2ML IV SOLN
INTRAVENOUS | Status: AC
  Filled 2023-05-10: qty 4

## 2023-05-10 MED ORDER — SODIUM CHLORIDE 0.9 % IR SOLN
Status: DC | PRN
  Administered 2023-05-10: 1 via INTRAVESICAL

## 2023-05-10 MED ORDER — ACETAMINOPHEN 10 MG/ML IV SOLN
INTRAVENOUS | Status: AC
  Filled 2023-05-10: qty 100

## 2023-05-10 MED ORDER — ONDANSETRON HCL 4 MG/2ML IJ SOLN: 4.0000 mg | Freq: Four times a day (QID) | INTRAMUSCULAR | Status: DC | PRN

## 2023-05-10 MED ORDER — AMISULPRIDE (ANTIEMETIC) 5 MG/2ML IV SOLN
10.0000 mg | Freq: Once | INTRAVENOUS | Status: AC | PRN
  Administered 2023-05-10: 10 mg via INTRAVENOUS

## 2023-05-10 MED ORDER — ONDANSETRON HCL 4 MG/2ML IJ SOLN
INTRAMUSCULAR | Status: DC | PRN
  Administered 2023-05-10: 4 mg via INTRAVENOUS

## 2023-05-10 MED ORDER — FLUORESCEIN SODIUM 10 % IV SOLN
INTRAVENOUS | Status: AC
  Filled 2023-05-10: qty 5

## 2023-05-10 MED ORDER — IBUPROFEN 200 MG PO TABS: 600.0000 mg | ORAL_TABLET | Freq: Four times a day (QID) | ORAL | Status: DC

## 2023-05-10 MED ORDER — CEFAZOLIN SODIUM-DEXTROSE 2-4 GM/100ML-% IV SOLN
2.0000 g | INTRAVENOUS | Status: AC
  Administered 2023-05-10: 2 g via INTRAVENOUS

## 2023-05-10 MED ORDER — BUPIVACAINE HCL 0.5 % IJ SOLN
INTRAMUSCULAR | Status: DC | PRN
  Administered 2023-05-10: 10 mL

## 2023-05-10 MED ORDER — CEFAZOLIN SODIUM-DEXTROSE 2-4 GM/100ML-% IV SOLN
INTRAVENOUS | Status: AC
  Filled 2023-05-10: qty 100

## 2023-05-10 MED ORDER — ONDANSETRON HCL 4 MG/2ML IJ SOLN
INTRAMUSCULAR | Status: AC
  Filled 2023-05-10: qty 2

## 2023-05-10 MED ORDER — POLYETHYLENE GLYCOL 3350 17 G PO PACK: 17.0000 g | PACK | Freq: Every day | ORAL | Status: DC | PRN

## 2023-05-10 MED ORDER — LACTATED RINGERS IV SOLN: INTRAVENOUS | Status: DC

## 2023-05-10 MED ORDER — 0.9 % SODIUM CHLORIDE (POUR BTL) OPTIME
TOPICAL | Status: DC | PRN
  Administered 2023-05-10: 200 mL

## 2023-05-10 MED ORDER — SCOPOLAMINE 1 MG/3DAYS TD PT72
1.0000 | MEDICATED_PATCH | TRANSDERMAL | Status: DC
  Administered 2023-05-10: 1 via TRANSDERMAL

## 2023-05-10 MED ORDER — GABAPENTIN 100 MG PO CAPS
100.0000 mg | ORAL_CAPSULE | Freq: Two times a day (BID) | ORAL | Status: DC
  Administered 2023-05-10: 100 mg via ORAL

## 2023-05-10 MED ORDER — ROCURONIUM BROMIDE 100 MG/10ML IV SOLN
INTRAVENOUS | Status: DC | PRN
  Administered 2023-05-10: 20 mg via INTRAVENOUS
  Administered 2023-05-10: 40 mg via INTRAVENOUS
  Administered 2023-05-10 (×2): 20 mg via INTRAVENOUS

## 2023-05-10 MED ORDER — PANTOPRAZOLE SODIUM 40 MG PO TBEC
DELAYED_RELEASE_TABLET | ORAL | Status: AC
  Filled 2023-05-10: qty 1

## 2023-05-10 MED ORDER — LIDOCAINE HCL (PF) 2 % IJ SOLN
INTRAMUSCULAR | Status: AC
  Filled 2023-05-10: qty 5

## 2023-05-10 MED ORDER — FENTANYL CITRATE (PF) 100 MCG/2ML IJ SOLN
25.0000 ug | INTRAMUSCULAR | Status: DC | PRN
  Administered 2023-05-10 (×2): 25 ug via INTRAVENOUS
  Administered 2023-05-10 (×2): 50 ug via INTRAVENOUS

## 2023-05-10 MED ORDER — OXYCODONE HCL 5 MG PO TABS
ORAL_TABLET | ORAL | Status: AC
  Filled 2023-05-10: qty 2

## 2023-05-10 MED ORDER — 0.9 % SODIUM CHLORIDE (POUR BTL) OPTIME
TOPICAL | Status: DC | PRN
  Administered 2023-05-10: 500 mL

## 2023-05-10 MED ORDER — LIDOCAINE HCL (CARDIAC) PF 100 MG/5ML IV SOSY
PREFILLED_SYRINGE | INTRAVENOUS | Status: DC | PRN
  Administered 2023-05-10: 60 mg via INTRAVENOUS

## 2023-05-10 MED ORDER — PROPOFOL 10 MG/ML IV BOLUS
INTRAVENOUS | Status: DC | PRN
  Administered 2023-05-10 (×2): 50 mg via INTRAVENOUS
  Administered 2023-05-10: 100 mg via INTRAVENOUS

## 2023-05-10 MED ORDER — DEXAMETHASONE SODIUM PHOSPHATE 10 MG/ML IJ SOLN
INTRAMUSCULAR | Status: AC
  Filled 2023-05-10: qty 1

## 2023-05-10 MED ORDER — KETOROLAC TROMETHAMINE 30 MG/ML IJ SOLN
30.0000 mg | Freq: Four times a day (QID) | INTRAMUSCULAR | Status: DC
  Administered 2023-05-10 – 2023-05-11 (×3): 30 mg via INTRAVENOUS

## 2023-05-10 MED ORDER — HYDROMORPHONE HCL 1 MG/ML IJ SOLN
0.2500 mg | INTRAMUSCULAR | Status: DC | PRN
  Administered 2023-05-10 (×2): 0.5 mg via INTRAVENOUS

## 2023-05-10 MED ORDER — MIDAZOLAM HCL 5 MG/5ML IJ SOLN
INTRAMUSCULAR | Status: DC | PRN
  Administered 2023-05-10: 2 mg via INTRAVENOUS

## 2023-05-10 MED ORDER — DEXAMETHASONE SODIUM PHOSPHATE 4 MG/ML IJ SOLN
INTRAMUSCULAR | Status: DC | PRN
  Administered 2023-05-10: 5 mg via INTRAVENOUS

## 2023-05-10 MED ORDER — PANTOPRAZOLE SODIUM 40 MG PO TBEC
40.0000 mg | DELAYED_RELEASE_TABLET | Freq: Every day | ORAL | Status: DC
  Administered 2023-05-10: 40 mg via ORAL

## 2023-05-10 MED ORDER — SCOPOLAMINE 1 MG/3DAYS TD PT72
MEDICATED_PATCH | TRANSDERMAL | Status: AC
  Filled 2023-05-10: qty 1

## 2023-05-10 MED ORDER — ACETAMINOPHEN 10 MG/ML IV SOLN
1000.0000 mg | Freq: Once | INTRAVENOUS | Status: AC
  Administered 2023-05-10: 1000 mg via INTRAVENOUS

## 2023-05-10 MED ORDER — ACETAMINOPHEN 500 MG PO TABS
ORAL_TABLET | ORAL | Status: AC
  Filled 2023-05-10: qty 2

## 2023-05-10 MED ORDER — ZOLPIDEM TARTRATE 5 MG PO TABS: 5.0000 mg | ORAL_TABLET | Freq: Every evening | ORAL | Status: DC | PRN

## 2023-05-10 MED ORDER — PROMETHAZINE HCL 25 MG/ML IJ SOLN: 6.2500 mg | INTRAMUSCULAR | Status: DC | PRN

## 2023-05-10 MED ORDER — OXYCODONE HCL 5 MG PO TABS: 5.0000 mg | ORAL_TABLET | Freq: Once | ORAL | Status: DC | PRN

## 2023-05-10 MED ORDER — FENTANYL CITRATE (PF) 100 MCG/2ML IJ SOLN
INTRAMUSCULAR | Status: DC | PRN
  Administered 2023-05-10 (×3): 50 ug via INTRAVENOUS
  Administered 2023-05-10: 100 ug via INTRAVENOUS
  Administered 2023-05-10 (×3): 50 ug via INTRAVENOUS

## 2023-05-10 MED ORDER — FENTANYL CITRATE (PF) 100 MCG/2ML IJ SOLN
50.0000 ug | Freq: Once | INTRAMUSCULAR | Status: AC
  Administered 2023-05-10: 50 ug via INTRAVENOUS

## 2023-05-10 MED ORDER — OXYCODONE HCL 5 MG PO TABS
5.0000 mg | ORAL_TABLET | ORAL | Status: DC | PRN
  Administered 2023-05-10 – 2023-05-11 (×3): 10 mg via ORAL

## 2023-05-10 MED ORDER — SIMETHICONE 80 MG PO CHEW: 80.0000 mg | CHEWABLE_TABLET | Freq: Four times a day (QID) | ORAL | Status: DC | PRN

## 2023-05-10 MED ORDER — GABAPENTIN 100 MG PO CAPS
ORAL_CAPSULE | ORAL | Status: AC
  Filled 2023-05-10: qty 1

## 2023-05-10 MED ORDER — ONDANSETRON HCL 4 MG PO TABS
4.0000 mg | ORAL_TABLET | Freq: Four times a day (QID) | ORAL | Status: DC | PRN
  Administered 2023-05-11: 4 mg via ORAL

## 2023-05-10 MED ORDER — POVIDONE-IODINE 10 % EX SWAB: 2.0000 | Freq: Once | CUTANEOUS | Status: DC

## 2023-05-10 MED ORDER — ROCURONIUM BROMIDE 10 MG/ML (PF) SYRINGE
PREFILLED_SYRINGE | INTRAVENOUS | Status: AC
  Filled 2023-05-10: qty 10

## 2023-05-10 MED ORDER — ACETAMINOPHEN 500 MG PO TABS
1000.0000 mg | ORAL_TABLET | ORAL | Status: AC
  Administered 2023-05-10: 1000 mg via ORAL

## 2023-05-10 MED ORDER — CELECOXIB 200 MG PO CAPS
ORAL_CAPSULE | ORAL | Status: AC
  Filled 2023-05-10: qty 2

## 2023-05-10 MED ORDER — CELECOXIB 200 MG PO CAPS
400.0000 mg | ORAL_CAPSULE | ORAL | Status: AC
  Administered 2023-05-10: 400 mg via ORAL

## 2023-05-10 SURGICAL SUPPLY — 53 items
ADH SKN CLS APL DERMABOND .7 (GAUZE/BANDAGES/DRESSINGS) ×2
APL PRP STRL LF DISP 70% ISPRP (MISCELLANEOUS) ×2
APL SRG 38 LTWT LNG FL B (MISCELLANEOUS) ×2
APPLICATOR ARISTA FLEXITIP XL (MISCELLANEOUS) IMPLANT
CHLORAPREP W/TINT 26 (MISCELLANEOUS) ×2 IMPLANT
COVER MAYO STAND STRL (DRAPES) ×2 IMPLANT
COVER SURGICAL LIGHT HANDLE (MISCELLANEOUS) ×2 IMPLANT
DEFOGGER SCOPE WARMER CLEARIFY (MISCELLANEOUS) ×2 IMPLANT
DERMABOND ADVANCED .7 DNX12 (GAUZE/BANDAGES/DRESSINGS) ×2 IMPLANT
DRAPE SURG IRRIG POUCH 19X23 (DRAPES) ×2 IMPLANT
GLOVE SURG SS PI 6.0 STRL IVOR (GLOVE) IMPLANT
GLOVE SURG SS PI 6.5 STRL IVOR (GLOVE) IMPLANT
GOWN STRL REUS W/TWL LRG LVL3 (GOWN DISPOSABLE) ×8 IMPLANT
HEMOSTAT ARISTA ABSORB 3G PWDR (HEMOSTASIS) IMPLANT
IRRIG SUCT STRYKERFLOW 2 WTIP (MISCELLANEOUS) ×2
IRRIGATION SUCT STRKRFLW 2 WTP (MISCELLANEOUS) ×2 IMPLANT
IV NS 1000ML (IV SOLUTION) ×4
IV NS 1000ML BAXH (IV SOLUTION) IMPLANT
KIT PINK PAD W/HEAD ARE REST (MISCELLANEOUS) ×2
KIT PINK PAD W/HEAD ARM REST (MISCELLANEOUS) ×2 IMPLANT
KIT TURNOVER CYSTO (KITS) ×2 IMPLANT
L-HOOK LAP DISP 36CM (ELECTROSURGICAL) ×2
LHOOK LAP DISP 36CM (ELECTROSURGICAL) ×2 IMPLANT
NDL INSUFFLATION 14GA 120MM (NEEDLE) ×2 IMPLANT
NEEDLE INSUFFLATION 14GA 120MM (NEEDLE) ×2 IMPLANT
NS IRRIG 1000ML POUR BTL (IV SOLUTION) ×2 IMPLANT
NS IRRIG 500ML POUR BTL (IV SOLUTION) IMPLANT
OCCLUDER COLPOPNEUMO (BALLOONS) IMPLANT
PACK LAPAROSCOPY BASIN (CUSTOM PROCEDURE TRAY) ×2 IMPLANT
PENCIL BUTTON HOLSTER BLD 10FT (ELECTRODE) ×2 IMPLANT
POUCH LAPAROSCOPIC INSTRUMENT (MISCELLANEOUS) ×2 IMPLANT
PROTECTOR NERVE ULNAR (MISCELLANEOUS) ×4 IMPLANT
SCRUB CHG 4% DYNA-HEX 4OZ (MISCELLANEOUS) ×2 IMPLANT
SEALER TISSUE G2 CVD JAW 35 (ENDOMECHANICALS) ×2 IMPLANT
SET IRRIG Y TYPE TUR BLADDER L (SET/KITS/TRAYS/PACK) ×2 IMPLANT
SET TUBE SMOKE EVAC HIGH FLOW (TUBING) ×2 IMPLANT
SLEEVE ADV FIXATION 5X100MM (TROCAR) ×4 IMPLANT
SLEEVE SCD COMPRESS KNEE MED (STOCKING) ×2 IMPLANT
SUT VIC AB 0 CT1 27 (SUTURE) ×4
SUT VIC AB 0 CT1 27XBRD ANBCTR (SUTURE) ×2 IMPLANT
SUT VIC AB 4-0 PS2 27 (SUTURE) ×4 IMPLANT
SUT VLOC 180 0 9IN GS21 (SUTURE) ×2 IMPLANT
SYR 10ML LL (SYRINGE) ×2 IMPLANT
SYR 50ML LL SCALE MARK (SYRINGE) ×2 IMPLANT
SYR TOOMEY IRRIG 70ML (MISCELLANEOUS) ×2
SYRINGE TOOMEY IRRIG 70ML (MISCELLANEOUS) IMPLANT
TIP UTERINE 6.7X6CM WHT DISP (MISCELLANEOUS) IMPLANT
TIP UTERINE 6.7X8CM BLUE DISP (MISCELLANEOUS) IMPLANT
TOWEL OR 17X24 6PK STRL BLUE (TOWEL DISPOSABLE) ×2 IMPLANT
TRAY FOLEY W/BAG SLVR 14FR LF (SET/KITS/TRAYS/PACK) ×2 IMPLANT
TROCAR ADV FIXATION 5X100MM (TROCAR) ×2 IMPLANT
TROCAR KII 8X100ML NONTHREADED (TROCAR) ×2 IMPLANT
WARMER LAPAROSCOPE (MISCELLANEOUS) ×2 IMPLANT

## 2023-05-10 NOTE — Op Note (Signed)
Sarah Benjamin PROCEDURE DATE: 05/10/2023  PREOPERATIVE DIAGNOSIS: Persistent postmenopausal bleeding due to left Essure coil POSTOPERATIVE DIAGNOSIS: The same PROCEDURE: Total laparoscopic hysterectomy, bilateral salpingectomy, cystoscopy SURGEON:  Dr. Milas Hock ASSISTANT:  Dr. Annamaria Boots.  An experienced assistant was required given the standard of surgical care given the complexity of the case.  This assistant was needed for exposure, dissection, suctioning, retraction, instrument exchange, and for overall help during the procedure.  INDICATIONS: 52 y.o. G2P2  here for definitive surgical management secondary to the indications listed under preoperative diagnoses; please see preoperative note for further details.  Risks of surgery were discussed with the patient including but not limited to: bleeding which may require transfusion or reoperation; infection which may require antibiotics; injury to bowel, bladder, ureters or other surrounding organs; need for additional procedures; thromboembolic phenomenon, incisional problems and other postoperative/anesthesia complications. Written informed consent was obtained.    FINDINGS: Cervix was partially flush with the vagina, especially posteriorly. Normal appearing tubes, ovaries and uterus. Otherwise normal appearing cervix. Normal EFG. Normal appearing vagina. Bladder was adherent high on the lower uterine segment. On cystoscopy, no evidence of bladder injury or sutures. Bilateral ureteral jets were present and brisk.   This surgery typically would take 60 minutes but ultimately took 150 minutes due to difficulty specifically getting the uterine manipulator to be properly placed and due to the location of the bladder and difficulty getting the bladder flap down.   ANESTHESIA:    General INTRAVENOUS FLUIDS:1600  ml ESTIMATED BLOOD LOSS: 200 ml URINE OUTPUT: 600 ml   SPECIMENS: Uterus, cervix, bilateral fallopian tubes COMPLICATIONS:  None immediate  PROCEDURE IN DETAIL:  The patient received intravenous antibiotics and had sequential compression devices applied to her lower extremities while in the preoperative area.  She was then taken to the operating room where general anesthesia was administered and was found to be adequate.  She was placed in the dorsal lithotomy position, and was prepped and draped in a sterile manner.    After an adequate timeout was performed, a Rumi uterine manipulator was placed at this time. In order to place the manipulator, adhesions of the posterior cervix had to be separated from the posterior vagina using the mayo scissors.    A Foley catheter was inserted into her bladder and attached to constant drainage.   Attention turned to patient's abdomen. Skin incision made with the scalpel in the umbilicus and closed technique used for entry using the veress needle. Opening pressure was 4.  Pneumoperitoneum achieved to a pressure of 15.  0 degree laparoscope was used with the 5mm optiview port and trocar to enter the cavity under direct visualization. Inspection below the point of entry revealed no evidence of bowel injury. Patient placed in steep trendelenburg. Lateral 5 mm ports were placed and an 8 mm suprapubic port was placed.    The pelvis was then carefully examined.  Attention was turned to the fallopian tubes; these were freed from the underlying mesosalpinx using the Enseal device.  The bilateral round and broad ligaments were then clamped and transected with the Enseal device.  The uterine artery was then skeletonized.  The bladder flap was created initially but then due to the location we back-filled the bladder with 200 cc NS. The bladder was noted to be high up on the lower uterine segment. The bladder was then carefully dissected down using the Enseal, suction device and counter traction with the pennington. The ureters were noted to be safely away  from the area of dissection.  At this point,  attention was turned to the uterine vessels, which were clamped and ligated using the Enseal.  Good hemostasis was noted overall.  The uterosacral and cardinal ligaments were clamped, cut and ligated bilaterally.  Attention was then turned to the cervicovaginal junction, and the bovie hook was used to transect the cervix from the surrounding vagina using the ring of the Rumi as a guide. This was done circumferentially allowing total hysterectomy.  The uterus, cervix and tubes were then removed from the vagina and the vaginal cuff incision was then closed with 0 v-lock.  Overall excellent hemostasis was noted. Pressure was then reduced and some light bleeding was noted on the midline aspect of the cuff and this was cauterized with the tips of the Enseal.  Hemostasis was then assured. Arista was then placed on the cuff.  All trocars were removed under direct visualization, and the abdomen was desufflated. All skin incisions were closed with 4-0 Vicryl subcuticular stitches and Dermabond.  Cystoscopy was then performed. Cystoscopy showed bilateral ureteral jets.  No stitches or injury were visualized in the bladder during cystoscopy.     The patient tolerated the procedures well.    All instruments, needles, and sponge counts were correct x 2. The patient was taken to the recovery room awake, extubated and in stable condition.   Milas Hock, MD, FACOG Obstetrician & Gynecologist, [facility] for [facility], [facility]

## 2023-05-10 NOTE — Anesthesia Procedure Notes (Signed)
Procedure Name: Intubation Date/Time: 05/10/2023 11:41 AM  Performed by: Jessica Priest, CRNAPre-anesthesia Checklist: Patient identified, Emergency Drugs available, Suction available, Patient being monitored and Timeout performed Patient Re-evaluated:Patient Re-evaluated prior to induction Oxygen Delivery Method: Circle system utilized Preoxygenation: Pre-oxygenation with 100% oxygen Induction Type: IV induction Ventilation: Mask ventilation without difficulty Laryngoscope Size: Mac and 3 Grade View: Grade II Tube type: Oral Tube size: 7.0 mm Number of attempts: 1 Airway Equipment and Method: Stylet and Oral airway Placement Confirmation: ETT inserted through vocal cords under direct vision, positive ETCO2, breath sounds checked- equal and bilateral and CO2 detector Secured at: 22 cm Tube secured with: Tape Dental Injury: Teeth and Oropharynx as per pre-operative assessment

## 2023-05-10 NOTE — Interval H&P Note (Signed)
History and Physical Interval Note:  05/10/2023 10:55 AM  Sarah Benjamin  has presented today for surgery, with the diagnosis of PMB.  The various methods of treatment have been discussed with the patient and family. After consideration of risks, benefits and other options for treatment, the patient has consented to  Procedure(s): TOTAL LAPAROSCOPIC HYSTERECTOMY WITH SALPINGECTOMY (Bilateral) CYSTOSCOPY (N/A) as a surgical intervention.  The patient's history has been reviewed, patient examined, no change in status, stable for surgery.  I have reviewed the patient's chart and labs.  Questions were answered to the patient's satisfaction.     Milas Hock

## 2023-05-10 NOTE — Anesthesia Preprocedure Evaluation (Addendum)
Anesthesia Evaluation  Patient identified by MRN, date of birth, ID band Patient awake    Reviewed: Allergy & Precautions, NPO status , Patient's Chart, lab work & pertinent test results  Airway Mallampati: II  TM Distance: >3 FB Neck ROM: Full    Dental no notable dental hx.    Pulmonary asthma    Pulmonary exam normal        Cardiovascular hypertension, Pt. on medications Normal cardiovascular exam+ Valvular Problems/Murmurs      Neuro/Psych  Headaches  negative psych ROS   GI/Hepatic negative GI ROS, Neg liver ROS,,,  Endo/Other  Hypothyroidism    Renal/GU negative Renal ROS     Musculoskeletal  (+) Arthritis ,    Abdominal  (+) + obese  Peds  Hematology negative hematology ROS (+)   Anesthesia Other Findings PMB  Reproductive/Obstetrics                             Anesthesia Physical Anesthesia Plan  ASA: 3  Anesthesia Plan: General   Post-op Pain Management:    Induction: Intravenous  PONV Risk Score and Plan: 4 or greater and Ondansetron, Dexamethasone, Midazolam, Scopolamine patch - Pre-op and Treatment may vary due to age or medical condition  Airway Management Planned: Oral ETT  Additional Equipment:   Intra-op Plan:   Post-operative Plan: Extubation in OR  Informed Consent: I have reviewed the patients History and Physical, chart, labs and discussed the procedure including the risks, benefits and alternatives for the proposed anesthesia with the patient or authorized representative who has indicated his/her understanding and acceptance.     Dental advisory given  Plan Discussed with: CRNA  Anesthesia Plan Comments:        Anesthesia Quick Evaluation

## 2023-05-10 NOTE — Transfer of Care (Signed)
Immediate Anesthesia Transfer of Care Note  Patient: Sarah Benjamin  Procedure(s) Performed: Procedure(s) (LRB): TOTAL LAPAROSCOPIC HYSTERECTOMY WITH BILATERAL SALPINGECTOMY (Bilateral) CYSTOSCOPY (N/A)  Patient Location: PACU  Anesthesia Type: General  Level of Consciousness: awake, sedated, patient cooperative and responds to stimulation  Airway & Oxygen Therapy: Patient Spontanous Breathing and Patient connected to [place] oxygen  Post-op Assessment: Report given to PACU RN, Post -op Vital signs reviewed and stable and Patient moving all extremities  Post vital signs: Reviewed and stable  Complications: No apparent anesthesia complications

## 2023-05-11 ENCOUNTER — Other Ambulatory Visit: Payer: Self-pay | Admitting: Obstetrics and Gynecology

## 2023-05-11 DIAGNOSIS — J45909 Unspecified asthma, uncomplicated: Secondary | ICD-10-CM | POA: Diagnosis not present

## 2023-05-11 DIAGNOSIS — N950 Postmenopausal bleeding: Secondary | ICD-10-CM | POA: Diagnosis not present

## 2023-05-11 DIAGNOSIS — Z9889 Other specified postprocedural states: Secondary | ICD-10-CM

## 2023-05-11 DIAGNOSIS — M1990 Unspecified osteoarthritis, unspecified site: Secondary | ICD-10-CM | POA: Diagnosis not present

## 2023-05-11 DIAGNOSIS — Z79899 Other long term (current) drug therapy: Secondary | ICD-10-CM | POA: Diagnosis not present

## 2023-05-11 DIAGNOSIS — Z6839 Body mass index (BMI) 39.0-39.9, adult: Secondary | ICD-10-CM | POA: Diagnosis not present

## 2023-05-11 DIAGNOSIS — I10 Essential (primary) hypertension: Secondary | ICD-10-CM | POA: Diagnosis not present

## 2023-05-11 DIAGNOSIS — E669 Obesity, unspecified: Secondary | ICD-10-CM | POA: Diagnosis not present

## 2023-05-11 DIAGNOSIS — E039 Hypothyroidism, unspecified: Secondary | ICD-10-CM | POA: Diagnosis not present

## 2023-05-11 LAB — TYPE AND SCREEN: ABO/RH(D): O POS

## 2023-05-11 MED ORDER — OXYCODONE HCL 5 MG PO TABS: 5.0000 mg | ORAL_TABLET | ORAL | 0 refills | Status: DC | PRN

## 2023-05-11 MED ORDER — KETOROLAC TROMETHAMINE 30 MG/ML IJ SOLN
INTRAMUSCULAR | Status: AC
  Filled 2023-05-11: qty 1

## 2023-05-11 MED ORDER — ONDANSETRON 4 MG PO TBDP
4.0000 mg | ORAL_TABLET | Freq: Four times a day (QID) | ORAL | 0 refills | Status: DC | PRN
Start: 2023-05-11 — End: 2023-06-07

## 2023-05-11 MED ORDER — OXYCODONE HCL 5 MG PO TABS
ORAL_TABLET | ORAL | Status: AC
  Filled 2023-05-11: qty 2

## 2023-05-11 MED ORDER — IBUPROFEN 800 MG PO TABS: 800.0000 mg | ORAL_TABLET | Freq: Three times a day (TID) | ORAL | 0 refills | Status: DC | PRN

## 2023-05-11 MED ORDER — ONDANSETRON HCL 4 MG PO TABS
ORAL_TABLET | ORAL | Status: AC
  Filled 2023-05-11: qty 1

## 2023-05-11 NOTE — Anesthesia Postprocedure Evaluation (Signed)
Anesthesia Post Note  Patient: Sarah Benjamin  Procedure(s) Performed: TOTAL LAPAROSCOPIC HYSTERECTOMY WITH BILATERAL SALPINGECTOMY (Bilateral: Abdomen) CYSTOSCOPY (Bladder)     Patient location during evaluation: PACU Anesthesia Type: General Level of consciousness: awake Pain management: pain level controlled Vital Signs Assessment: post-procedure vital signs reviewed and stable Respiratory status: spontaneous breathing, nonlabored ventilation and respiratory function stable Cardiovascular status: blood pressure returned to baseline and stable Postop Assessment: no apparent nausea or vomiting Anesthetic complications: no   No notable events documented.  Last Vitals:  Vitals:   05/11/23 0217 05/11/23 0619  BP: 120/74 112/67  Pulse: 69 74  Resp: 14 16  Temp: 37.5 C 37.7 C  SpO2: 95% 94%    Last Pain:  Vitals:   05/11/23 0619  TempSrc:   PainSc: 5                  Daveon Arpino P Novella Abraha

## 2023-05-11 NOTE — Discharge Summary (Signed)
Gynecology Physician Postoperative Discharge Summary  Patient ID: Sarah Benjamin MRN: [id number] DOB/AGE: Jan 27, 1971 51 y.o.  Admit Date: 05/10/2023 Discharge Date: 05/11/2023  Preoperative Diagnoses: PMB  Procedures: Procedure(s) (LRB): TOTAL LAPAROSCOPIC HYSTERECTOMY WITH BILATERAL SALPINGECTOMY (Bilateral) CYSTOSCOPY (N/A)  Hospital Course:  Sarah Benjamin is a 52 y.o. G2P2  admitted for scheduled surgery.  She underwent the procedures as mentioned above, her operation was uncomplicated. For further details about surgery, please refer to the operative report. Patient had an uncomplicated postoperative course. By time of discharge on POD#1, her pain was controlled on oral pain medications; she was ambulating, voiding without difficulty, tolerating regular diet and passing flatus. She was deemed stable for discharge to home.   Significant Labs:    Latest Ref Rng & Units 05/10/2023    5:17 PM 05/08/2023    1:45 PM 01/12/2023    2:20 AM  CBC  WBC 4.0 - 10.5 K/uL  6.4  9.6   Hemoglobin 12.0 - 15.0 g/dL 66.4  40.3  47.4   Hematocrit 36.0 - 46.0 %  45.7  43.8   Platelets 150 - 400 K/uL  203  200     Discharge Exam: Blood pressure 112/67, pulse 74, temperature 99.8 F (37.7 C), resp. rate 16, height 5\' 2"  (1.575 m), weight 99 kg, last menstrual period 01/27/2020, SpO2 94 %. General appearance: alert and no distress  Resp: clear to auscultation bilaterally  Cardio: regular rate and rhythm  GI: soft, non-tender; bowel sounds normal; no masses, no organomegaly.  Incision: C/D/I, no erythema, no drainage noted Extremities: extremities normal, atraumatic, no cyanosis or edema and Homans sign is negative, no sign of DVT  Discharged Condition: Stable  Disposition: Discharge disposition: 01-Home or Self Care       Discharge Instructions     Activity as tolerated - No restrictions   Complete by: As directed    Call MD for:  difficulty breathing, headache or visual disturbances    Complete by: As directed    Call MD for:  persistant nausea and vomiting   Complete by: As directed    Call MD for:  redness, tenderness, or signs of infection (pain, swelling, redness, odor or green/yellow discharge around incision site)   Complete by: As directed    Call MD for:  severe uncontrolled pain   Complete by: As directed    Call MD for:  temperature >100.4   Complete by: As directed    Diet - low sodium heart healthy   Complete by: As directed    Discharge wound care:   Complete by: As directed    You have dermabond (which is like super glue) over your incisions. You have no additional care for them. The dermabond will dissolve on its own. You may get the incisions wet.   Driving Restrictions   Complete by: As directed    When not taking narcotic pain medication and would not hesitate to use the breaks, usually about 7 days   May shower / Bathe   Complete by: As directed       Allergies as of 05/11/2023       Reactions   Other Anaphylaxis   Kiwi, apples, grapefruit, onions, walnuts Patient is also allergic to pollen - runny nose, itchy eyes,ears and throat.        Medication List     STOP taking these medications    albuterol 108 (90 Base) MCG/ACT inhaler Commonly known as: VENTOLIN HFA  TAKE these medications    EPINEPHrine 0.3 mg/0.3 mL Soaj injection Commonly known as: EpiPen 2-Pak Inject 0.3 mg into the muscle as needed for anaphylaxis.   ibuprofen 800 MG tablet Commonly known as: ADVIL Take 1 tablet (800 mg total) by mouth 3 (three) times daily with meals as needed for headache or moderate pain.   levothyroxine 112 MCG tablet Commonly known as: SYNTHROID Take 1 tablet (112 mcg total) by mouth daily before breakfast. No refills. Needs labs for thyroid check   losartan-hydrochlorothiazide 50-12.5 MG tablet Commonly known as: HYZAAR Take 1 tablet by mouth daily.   oxyCODONE 5 MG immediate release tablet Commonly known as: Oxy  IR/ROXICODONE Take 1 tablet (5 mg total) by mouth every 4 (four) hours as needed for severe pain or breakthrough pain.               Discharge Care Instructions  (From admission, onward)           Start     Ordered   05/11/23 0000  Discharge wound care:       Comments: You have dermabond (which is like super glue) over your incisions. You have no additional care for them. The dermabond will dissolve on its own. You may get the incisions wet.   05/11/23 8756           Future Appointments  Date Time Provider Department Center  06/07/2023  1:50 PM Milas Hock, MD [facility]     Total discharge time: 20 minutes   Signed:  Milas Hock, MD, FACOG Attending Obstetrician & Gynecologist Faculty Practice, [facility]

## 2023-05-11 NOTE — Progress Notes (Signed)
Postop nausea - sent in Zofran.

## 2023-05-12 ENCOUNTER — Encounter (HOSPITAL_BASED_OUTPATIENT_CLINIC_OR_DEPARTMENT_OTHER): Payer: Self-pay | Admitting: Obstetrics and Gynecology

## 2023-05-12 LAB — SURGICAL PATHOLOGY

## 2023-06-05 ENCOUNTER — Ambulatory Visit: Payer: BC Managed Care – PPO | Admitting: Sports Medicine

## 2023-06-05 ENCOUNTER — Ambulatory Visit (INDEPENDENT_AMBULATORY_CARE_PROVIDER_SITE_OTHER): Payer: BC Managed Care – PPO

## 2023-06-05 DIAGNOSIS — M5412 Radiculopathy, cervical region: Secondary | ICD-10-CM

## 2023-06-05 DIAGNOSIS — M4802 Spinal stenosis, cervical region: Secondary | ICD-10-CM | POA: Diagnosis not present

## 2023-06-05 MED ORDER — PREDNISONE 50 MG PO TABS
ORAL_TABLET | ORAL | 0 refills | Status: DC
Start: 2023-06-05 — End: 2023-06-07

## 2023-06-05 NOTE — Progress Notes (Signed)
    Procedures performed today:    None.  Independent interpretation of notes and tests performed by another provider:   None.  Brief History, Exam, Impression, and Recommendations:    Cervical radiculopathy This pleasant 52 year old female is back, she has pain right third finger, she describes it more as numbness and tingling, pins-and-needles. 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 On exam her hand looks normal, she really does not have any discrete areas of tenderness to palpation, she has negative Tinel's and Phalen signs, she is a negative Spurling's test on the neck. She does endorse that her discomfort comes from the middle finger radial aspect up the arm, back of the shoulder to the neck. I explained to her that this likely represented a cervical radiculopathy, we will add 5 days of prednisone, updated C-spine x-rays, formal PT, return to see me in about 6 weeks.    ____________________________________________ Ihor Austin. Benjamin Stain, M.D., ABFM., CAQSM., AME. Primary Care and Sports Medicine [facility]  Adjunct Professor of Family Medicine  [employer]  Restaurant manager, fast food

## 2023-06-05 NOTE — Assessment & Plan Note (Signed)
This pleasant 52 year old female is back, she has pain right third finger, she describes it more as numbness and tingling, pins-and-needles. 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 On exam her hand looks normal, she really does not have any discrete areas of tenderness to palpation, she has negative Tinel's and Phalen signs, she is a negative Spurling's test on the neck. She does endorse that her discomfort comes from the middle finger radial aspect up the arm, back of the shoulder to the neck. I explained to her that this likely represented a cervical radiculopathy, we will add 5 days of prednisone, updated C-spine x-rays, formal PT, return to see me in about 6 weeks.

## 2023-06-06 ENCOUNTER — Ambulatory Visit: Payer: BC Managed Care – PPO | Attending: Sports Medicine | Admitting: Physical Therapy

## 2023-06-06 ENCOUNTER — Other Ambulatory Visit: Payer: Self-pay

## 2023-06-06 ENCOUNTER — Encounter: Payer: Self-pay | Admitting: Physical Therapy

## 2023-06-06 DIAGNOSIS — M542 Cervicalgia: Secondary | ICD-10-CM | POA: Insufficient documentation

## 2023-06-06 DIAGNOSIS — M6281 Muscle weakness (generalized): Secondary | ICD-10-CM | POA: Insufficient documentation

## 2023-06-06 DIAGNOSIS — M5412 Radiculopathy, cervical region: Secondary | ICD-10-CM | POA: Diagnosis not present

## 2023-06-06 DIAGNOSIS — R293 Abnormal posture: Secondary | ICD-10-CM | POA: Diagnosis present

## 2023-06-06 NOTE — Therapy (Signed)
OUTPATIENT PHYSICAL THERAPY CERVICAL EVALUATION   Patient Name: Sarah Benjamin MRN: [id number] DOB:[birth year], 52 y.o., female Today's Date: 06/06/2023  END OF SESSION:  PT End of Session - 06/06/23 0849     Visit Number 1    Number of Visits 16    Date for PT Re-Evaluation 08/01/23    Authorization Type BCBS    PT Start Time [phone]    PT Stop Time 0930    PT Time Calculation (min) 41 min    Behavior During Therapy [facility] for tasks assessed/performed             Past Medical History:  Diagnosis Date   Asthma    during pollen season, follows w/ PCP   Bell's palsy    as a younger woman   Chest pain    12/09/20 cardiology visit with Juliane Lack, MD. Pain thought to be musculoskeletal in nature / no furthing testing.   Complication of anesthesia    12/2022 - Patient stated that her O2 level dropped after surgery and she had to stay in the hospital overnight. Per MD note, patient had respiratory depression due to pain medication and was kept overnight.   COVID 2020   with pneumonia   Heart murmur    no problems from murmur per pt   History of PCOS    Hypertension    Follows w/ PCP.   Hypothyroidism    Follows w/ PCP.   Lumbar back pain    mild L4 - L5 DDD, very mild spinal stenosis   Paresthesia and pain of extremity    right hand - carpal tunnel syndrome   Pneumonia 2020   with Covid   Pre-diabetes    Vaginal Pap smear, abnormal    Wears glasses    Past Surgical History:  Procedure Laterality Date   COLONOSCOPY  01/14/2022   diverticula in sigmoid colon, 2 sessile polyps   CYSTOSCOPY N/A 05/10/2023   Procedure: CYSTOSCOPY;  Surgeon: Milas Hock, MD;  Location: [facility];  Service: Gynecology;  Laterality: N/A;   ENDOMETRIAL ABLATION  2010   ESSURE TUBAL LIGATION Left    around 2011   HYSTEROSCOPY N/A 01/11/2023   Procedure: HYSTEROSCOPY;  Surgeon: Milas Hock, MD;  Location: [facility] OR;  Service: Gynecology;  Laterality: N/A;   HYSTEROSCOPY WITH D  & C N/A 01/11/2023   Procedure: DILATATION AND CURETTAGE;  Surgeon: Milas Hock, MD;  Location: [facility] OR;  Service: Gynecology;  Laterality: N/A;   TOTAL LAPAROSCOPIC HYSTERECTOMY WITH SALPINGECTOMY Bilateral 05/10/2023   Procedure: TOTAL LAPAROSCOPIC HYSTERECTOMY WITH BILATERAL SALPINGECTOMY;  Surgeon: Milas Hock, MD;  Location: [facility] [place] [place];  Service: Gynecology;  Laterality: Bilateral;   WISDOM TOOTH EXTRACTION     Patient Active Problem List   Diagnosis Date Noted   Cervical radiculopathy 08/31/2022   Acute bacterial sinusitis 08/31/2022   Lumbar back pain with radiculopathy affecting lower extremity 03/04/2022   Well adult exam 10/05/2021   Impingement syndrome, shoulder, right 09/29/2021   Stress fracture of metatarsal bone of left foot 07/08/2020   Allergic rhinitis 06/12/2018   Transaminitis 06/12/2018   BPPV (benign paroxysmal positional vertigo), right 06/04/2018   Essential hypertension 02/12/2018   Numbness and tingling of left side of face 02/12/2018   Prediabetes 06/21/2017   Irregular menses 03/30/2017   Dizziness 04/19/2016   Seasonal allergies 04/19/2016   Sinus headache 04/19/2016   History of Bell's palsy 04/19/2016   PFO (patent foramen ovale) 01/12/2016   Pain in  the chest 01/12/2016   Hip pain 04/17/2015   Mild persistent asthma without complication 02/18/2015   Hypothyroid 01/13/2014   Constipation 01/13/2014   Migraine 01/13/2014    PCP: Everrett Coombe, DO  REFERRING PROVIDER: Monica Becton, MD  REFERRING DIAG: [phone] (ICD-10-CM) - Cervical radiculopathy  THERAPY DIAG:  Radiculopathy, cervical region  Cervicalgia  Muscle weakness (generalized)  Abnormal posture  Rationale for Evaluation and Treatment: Rehabilitation  ONSET DATE: ~10 days  SUBJECTIVE:                                                                                                                                                                                                          SUBJECTIVE STATEMENT: Pt states for about a week to 10 days she gets burning sensation in the middle finger and goes all the way to her neck. She feels it at night the most. Pt states she had hysterectomy 4 weeks ago (still a little sore). Pt had a neck x-ray. Pt does note carpal tunnel in her R hand.  Hand dominance: Right  PERTINENT HISTORY:  Hysterectomy 4 weeks ago  PAIN:  Are you having pain? Yes: NPRS scale: currently 1 or 2; at worst 12/10 Pain location: plantar aspect of R middle finger Pain description: Burning, N/T Aggravating factors: Night Relieving factors: Prednisone  PRECAUTIONS: None  RED FLAGS: None     WEIGHT BEARING RESTRICTIONS: No  FALLS:  Has patient fallen in last 6 months? No  LIVING ENVIRONMENT: Lives with:  daughters Lives in: House/apartment  OCCUPATION: Works at [employer] -- supposed to go back in 2 weeks  PLOF: Independent  PATIENT GOALS: Improve N/T and neck pain for return to work  NEXT MD VISIT: n/a  OBJECTIVE:   DIAGNOSTIC FINDINGS:  Cervical x-ray performed 06/05/23 -- results pending  PATIENT SURVEYS:  FOTO 50; predicted 69  COGNITION: Overall cognitive status: Within functional limits for tasks assessed  SENSATION: Plantar surface of R middle finger  POSTURE: rounded shoulders and forward head  PALPATION: TTP cervicothoracic paraspinal and UTs   CERVICAL ROM:   Active ROM A/PROM (deg) eval  Flexion 25*  Extension 28  Right lateral flexion 45  Left lateral flexion 45*  Right rotation 45  Left rotation 40*   (Blank rows = not tested) * = concordant pain  UPPER EXTREMITY ROM:  Active ROM Right eval Left eval  Shoulder flexion WNL WNL  Shoulder extension WNL WNL  Shoulder abduction WNL * WNL  Shoulder adduction    Shoulder internal rotation WNL * WNL  Shoulder external rotation WNL WNL  Elbow flexion    Elbow extension    Wrist flexion    Wrist extension    Wrist  ulnar deviation    Wrist radial deviation    Wrist pronation    Wrist supination     (Blank rows = not tested) * = concordant pain  UPPER EXTREMITY MMT:  MMT Right eval Left eval  Shoulder flexion 5 5  Shoulder extension 5 5  Shoulder abduction 5 5  Shoulder adduction    Shoulder extension    Shoulder internal rotation 5 5  Shoulder external rotation 5 5  Middle trapezius 3+ 4  Lower trapezius 3 3+  Elbow flexion    Elbow extension    Wrist flexion    Wrist extension    Wrist ulnar deviation    Wrist radial deviation    Wrist pronation    Wrist supination    Grip strength 45 lbs 45 lbs   (Blank rows = not tested)  CERVICAL SPECIAL TESTS:  Upper limb tension test (ULTT): Positive ulnar nerve  FUNCTIONAL TESTS:  Did not assess  TODAY'S TREATMENT:                                                                                                                              DATE: 06/06/23 Manual therapy STM & TPR UT/paraspinals Skilled asssessment and palpation for TPDN Trigger Point Dry-Needling  Treatment instructions: Expect mild to moderate muscle soreness. S/S of pneumothorax if dry needled over a lung field, and to seek immediate medical attention should they occur. Patient verbalized understanding of these instructions and education.  Patient Consent Given: Yes Education handout provided: Yes Muscles treated: R UT, R cervical paraspinals Electrical stimulation performed: No Parameters: N/A Treatment response/outcome: Twitch response ilicited, decreased muscle tension See HEP below   PATIENT EDUCATION:  Education details: Exam findings, POC, initial HEP Person educated: Patient Education method: Explanation, Demonstration, and Handouts Education comprehension: verbalized understanding, returned demonstration, and needs further education  HOME EXERCISE PROGRAM: Access Code: [email or web address] URL: [email or web address] Date: 06/06/2023 Prepared by:  Vernon Prey April Kirstie Peri  Exercises - Seated Gentle Upper Trapezius Stretch  - 1 x daily - 7 x weekly - 2 sets - 30 sec hold - Seated Levator Scapulae Stretch  - 1 x daily - 7 x weekly - 2 sets - 30 sec hold - Ulnar Nerve Mobilization - Low Level  - 1 x daily - 7 x weekly - 1 sets - 10 reps - Seated Cervical Retraction  - 1 x daily - 7 x weekly - 2 sets - 10 reps - Shoulder External Rotation in 45 Degrees Abduction  - 1 x daily - 7 x weekly - 2 sets - 10 reps  ASSESSMENT:  CLINICAL IMPRESSION: Patient is a 52 y.o. F who was seen today for physical therapy evaluation and treatment for cervical radiculopathy. Assessment significant for increased ulnar nerve tension and abnormal sensation along C7  dermatome. Pt demos decreased cervical ROM and pain with shoulder movement despite full motion. Pt has significant midback weakness on R likely contributing to her s/s of nerve impingement. Pt will benefit from PT to address these issues and return to full function for work tasks. Pt is currently out of work s/p hysterectomy.   OBJECTIVE IMPAIRMENTS: decreased activity tolerance, decreased endurance, decreased mobility, decreased ROM, decreased strength, increased fascial restrictions, impaired sensation, impaired UE functional use, improper body mechanics, postural dysfunction, and pain.   ACTIVITY LIMITATIONS: carrying, lifting, sleeping, toileting, dressing, reach over head, hygiene/grooming, and caring for others  PARTICIPATION LIMITATIONS: cleaning, laundry, community activity, occupation, and yard work  PERSONAL FACTORS: Age, Fitness, Past/current experiences, and Time since onset of injury/illness/exacerbation are also affecting patient's functional outcome.   REHAB POTENTIAL: Good  CLINICAL DECISION MAKING: Stable/uncomplicated  EVALUATION COMPLEXITY: Low   GOALS: Goals reviewed with patient? Yes  SHORT TERM GOALS: Target date: 07/04/2023   Pt will be ind with her HEP Baseline:   Goal status: INITIAL  2.  Pt will have full pain free shoulder ROM Baseline:  Goal status: INITIAL   LONG TERM GOALS: Target date: 08/01/2023   Pt will be ind with management and progression of HEP Baseline:  Goal status: INITIAL  2.  Pt will have improved FOTO score to >/=69 Baseline: 50 Goal status: INITIAL  3.  Pt will be able to lift and carry at least 5# overhead without pain Baseline:  Goal status: INITIAL  4.  Pt will report at least 50% resolution of middle finger N/T and night pain Baseline:  Goal status: INITIAL  5.  Pt will have full pain free cervical ROM Baseline:  Goal status: INITIAL    PLAN:  PT FREQUENCY: 1-2x/week  PT DURATION: 8 weeks  PLANNED INTERVENTIONS: Therapeutic exercises, Therapeutic activity, Neuromuscular re-education, Balance training, Gait training, Patient/Family education, Self Care, Joint mobilization, Aquatic Therapy, Dry Needling, Electrical stimulation, Spinal manipulation, Spinal mobilization, Cryotherapy, Moist heat, Taping, Traction, Ionotophoresis 4mg /ml Dexamethasone, Manual therapy, and Re-evaluation  PLAN FOR NEXT SESSION: Assess response to HEP. TPDN/manual work as indicated. Traction (?) Nerve glide, postural stability, neck stabilizer strengthening   Xavier Fournier April Ma L Harutyun Monteverde, PT 06/06/2023, 5:11 PM

## 2023-06-06 NOTE — Progress Notes (Signed)
   GYNECOLOGY OFFICE VISIT NOTE  History:   Sarah Benjamin is a 52 y.o. G2P2 here today for postop check following TLH, BS, and cystoscopy on 6/26.  Discussed benign pathology.   Pt reports overall improving. Having some trouble sleeping due to pain but also trouble falling asleep. Started a melatonin tea. Has some GI distress in response to certain foods - bloating. No constipation.      The following portions of the patient's history were reviewed and updated as appropriate: allergies, current medications, past family history, past medical history, past social history, past surgical history and problem list.   Health Maintenance:   Diagnosis  Date Value Ref Range Status  10/20/2022   Final   - Negative for intraepithelial lesion or malignancy (NILM)   MXR wnl 09/2022.   Review of Systems:  Pertinent items noted in HPI and remainder of comprehensive ROS otherwise negative.  Physical Exam:  BP 133/80   Pulse 69   Ht 5\' 2"  (1.575 m)   Wt 223 lb (101.2 kg)   LMP 01/27/2020   BMI 40.79 kg/m  CONSTITUTIONAL: Well-developed, well-nourished female in no acute distress.  HEENT:  Normocephalic, atraumatic. External right and left ear normal. No scleral icterus.  NECK: Normal range of motion, supple, no masses noted on observation SKIN: No rash noted. Not diaphoretic. No erythema. No pallor. MUSCULOSKELETAL: Normal range of motion. No edema noted. NEUROLOGIC: Alert and oriented to person, place, and time. Normal muscle tone coordination. No cranial nerve deficit noted. PSYCHIATRIC: Normal mood and affect. Normal behavior. Normal judgment and thought content.  PELVIC:  Cuff healing well     Labs and Imaging No results found for this or any previous visit (from the past 168 hour(s)). No results found.  Assessment and Plan:   1. Postop check - Start gabapentin to help with the sharp-shooting pain. Will also aid in sleeping due to side effects. Continue melatonin tea.  - Reviewed  return to work but with weight limits until 12 weeks. Plans return on 8/12.  - Discussed florastor for bloating, GI distress - May do pool/tub bath. No clearance for intercourse yet.    Meds ordered this encounter  Medications   gabapentin (NEURONTIN) 600 MG tablet    Sig: Take 1 tablet (600 mg total) by mouth 2 (two) times daily as needed (pain).    Dispense:  60 tablet    Refill:  0     Routine preventative health maintenance measures emphasized. Please refer to After Visit Summary for other counseling recommendations.   Return in about 8 weeks (around 08/02/2023).  Milas Hock, MD, FACOG Obstetrician & Gynecologist, [facility] for [facility], [facility]

## 2023-06-07 ENCOUNTER — Encounter: Payer: Self-pay | Admitting: Obstetrics and Gynecology

## 2023-06-07 ENCOUNTER — Ambulatory Visit (INDEPENDENT_AMBULATORY_CARE_PROVIDER_SITE_OTHER): Payer: BC Managed Care – PPO | Admitting: Obstetrics and Gynecology

## 2023-06-07 VITALS — BP 133/80 | HR 69 | Ht 62.0 in | Wt 223.0 lb

## 2023-06-07 DIAGNOSIS — Z09 Encounter for follow-up examination after completed treatment for conditions other than malignant neoplasm: Secondary | ICD-10-CM

## 2023-06-07 MED ORDER — GABAPENTIN 600 MG PO TABS
600.0000 mg | ORAL_TABLET | Freq: Two times a day (BID) | ORAL | 0 refills | Status: DC | PRN
Start: 2023-06-07 — End: 2023-07-10

## 2023-06-07 NOTE — Patient Instructions (Signed)
Over the [employer].

## 2023-06-08 ENCOUNTER — Encounter: Payer: Self-pay | Admitting: Physical Therapy

## 2023-06-08 ENCOUNTER — Ambulatory Visit: Payer: BC Managed Care – PPO | Admitting: Physical Therapy

## 2023-06-08 DIAGNOSIS — M6281 Muscle weakness (generalized): Secondary | ICD-10-CM

## 2023-06-08 DIAGNOSIS — R293 Abnormal posture: Secondary | ICD-10-CM | POA: Diagnosis not present

## 2023-06-08 DIAGNOSIS — M542 Cervicalgia: Secondary | ICD-10-CM

## 2023-06-08 DIAGNOSIS — M5412 Radiculopathy, cervical region: Secondary | ICD-10-CM | POA: Diagnosis not present

## 2023-06-08 NOTE — Therapy (Signed)
OUTPATIENT PHYSICAL THERAPY CERVICAL TREATMENT   Patient Name: Sarah Benjamin MRN: [id number] DOB:01-Mar-1971, 52 y.o., female Today's Date: 06/08/2023  END OF SESSION:  PT End of Session - 06/08/23 0855     Visit Number 2    Number of Visits 16    Date for PT Re-Evaluation 08/01/23    Authorization Type BCBS    PT Start Time 0849    PT Stop Time 0927    PT Time Calculation (min) 38 min    Activity Tolerance Patient tolerated treatment well    Behavior During Therapy [facility] for tasks assessed/performed              Past Medical History:  Diagnosis Date   Asthma    during pollen season, follows w/ PCP   Bell's palsy    as a younger woman   Chest pain    12/09/20 cardiology visit with Juliane Lack, MD. Pain thought to be musculoskeletal in nature / no furthing testing.   Complication of anesthesia    12/2022 - Patient stated that her O2 level dropped after surgery and she had to stay in the hospital overnight. Per MD note, patient had respiratory depression due to pain medication and was kept overnight.   COVID 2020   with pneumonia   Heart murmur    no problems from murmur per pt   History of PCOS    Hypertension    Follows w/ PCP.   Hypothyroidism    Follows w/ PCP.   Lumbar back pain    mild L4 - L5 DDD, very mild spinal stenosis   Paresthesia and pain of extremity    right hand - carpal tunnel syndrome   Pneumonia 2020   with Covid   Pre-diabetes    Vaginal Pap smear, abnormal    Wears glasses    Past Surgical History:  Procedure Laterality Date   COLONOSCOPY  01/14/2022   diverticula in sigmoid colon, 2 sessile polyps   CYSTOSCOPY N/A 05/10/2023   Procedure: CYSTOSCOPY;  Surgeon: Milas Hock, MD;  Location: [facility] [place];  Service: Gynecology;  Laterality: N/A;   ENDOMETRIAL ABLATION  2010   ESSURE TUBAL LIGATION Left    around 2011   HYSTEROSCOPY N/A 01/11/2023   Procedure: HYSTEROSCOPY;  Surgeon: Milas Hock, MD;  Location: [facility] OR;   Service: Gynecology;  Laterality: N/A;   HYSTEROSCOPY WITH D & C N/A 01/11/2023   Procedure: DILATATION AND CURETTAGE;  Surgeon: Milas Hock, MD;  Location: [facility] OR;  Service: Gynecology;  Laterality: N/A;   TOTAL LAPAROSCOPIC HYSTERECTOMY WITH SALPINGECTOMY Bilateral 05/10/2023   Procedure: TOTAL LAPAROSCOPIC HYSTERECTOMY WITH BILATERAL SALPINGECTOMY;  Surgeon: Milas Hock, MD;  Location: [facility] [place];  Service: Gynecology;  Laterality: Bilateral;   WISDOM TOOTH EXTRACTION     Patient Active Problem List   Diagnosis Date Noted   Cervical radiculopathy 08/31/2022   Acute bacterial sinusitis 08/31/2022   Lumbar back pain with radiculopathy affecting lower extremity 03/04/2022   Well adult exam 10/05/2021   Impingement syndrome, shoulder, right 09/29/2021   Stress fracture of metatarsal bone of left foot 07/08/2020   Allergic rhinitis 06/12/2018   Transaminitis 06/12/2018   BPPV (benign paroxysmal positional vertigo), right 06/04/2018   Essential hypertension 02/12/2018   Numbness and tingling of left side of face 02/12/2018   Prediabetes 06/21/2017   Irregular menses 03/30/2017   Dizziness 04/19/2016   Seasonal allergies 04/19/2016   Sinus headache 04/19/2016   History of Bell's palsy 04/19/2016  PFO (patent foramen ovale) 01/12/2016   Pain in the chest 01/12/2016   Hip pain 04/17/2015   Mild persistent asthma without complication 02/18/2015   Hypothyroid 01/13/2014   Constipation 01/13/2014   Migraine 01/13/2014    PCP: Everrett Coombe, DO  REFERRING PROVIDER: Monica Becton, MD  REFERRING DIAG: [phone] (ICD-10-CM) - Cervical radiculopathy  THERAPY DIAG:  Radiculopathy, cervical region  Cervicalgia  Muscle weakness (generalized)  Abnormal posture  Rationale for Evaluation and Treatment: Rehabilitation  ONSET DATE: ~10 days  SUBJECTIVE:                                                                                                                                                                                                          SUBJECTIVE STATEMENT:  Feeling better, pain is definitely improving especially tingling in finger. Dry needling really helped would like to do it again. Things are going well.   Hand dominance: Right  PERTINENT HISTORY:  Hysterectomy 4 weeks ago  PAIN:  Are you having pain? Yes: NPRS scale: 3/10 now, but still gets bad at night/10 Pain location: plantar aspect of R middle finger Pain description: Burning, N/T Aggravating factors: Night Relieving factors: Prednisone  PRECAUTIONS: None  RED FLAGS: None     WEIGHT BEARING RESTRICTIONS: No  FALLS:  Has patient fallen in last 6 months? No  LIVING ENVIRONMENT: Lives with:  daughters Lives in: House/apartment  OCCUPATION: Works at [employer] -- supposed to go back in 2 weeks  PLOF: Independent  PATIENT GOALS: Improve N/T and neck pain for return to work  NEXT MD VISIT: n/a  OBJECTIVE:   DIAGNOSTIC FINDINGS:  Cervical x-ray performed 06/05/23 -- results pending  PATIENT SURVEYS:  FOTO 50; predicted 69  COGNITION: Overall cognitive status: Within functional limits for tasks assessed  SENSATION: Plantar surface of R middle finger  POSTURE: rounded shoulders and forward head  PALPATION: TTP cervicothoracic paraspinal and UTs   CERVICAL ROM:   Active ROM A/PROM (deg) eval  Flexion 25*  Extension 28  Right lateral flexion 45  Left lateral flexion 45*  Right rotation 45  Left rotation 40*   (Blank rows = not tested) * = concordant pain  UPPER EXTREMITY ROM:  Active ROM Right eval Left eval  Shoulder flexion WNL WNL  Shoulder extension WNL WNL  Shoulder abduction WNL * WNL  Shoulder adduction    Shoulder internal rotation WNL * WNL  Shoulder external rotation WNL WNL  Elbow flexion    Elbow extension    Wrist flexion    Wrist  extension    Wrist ulnar deviation    Wrist radial deviation    Wrist  pronation    Wrist supination     (Blank rows = not tested) * = concordant pain  UPPER EXTREMITY MMT:  MMT Right eval Left eval  Shoulder flexion 5 5  Shoulder extension 5 5  Shoulder abduction 5 5  Shoulder adduction    Shoulder extension    Shoulder internal rotation 5 5  Shoulder external rotation 5 5  Middle trapezius 3+ 4  Lower trapezius 3 3+  Elbow flexion    Elbow extension    Wrist flexion    Wrist extension    Wrist ulnar deviation    Wrist radial deviation    Wrist pronation    Wrist supination    Grip strength 45 lbs 45 lbs   (Blank rows = not tested)  CERVICAL SPECIAL TESTS:  Upper limb tension test (ULTT): Positive ulnar nerve  FUNCTIONAL TESTS:  Did not assess  TODAY'S TREATMENT:                                                                                                                              DATE:   06/08/23  TherEx  UBE L3 x3 min forward/3 min backward Upper trap stretch 2x30 seconds B Levator stretch 2x30 seconds B Scalene stretch 2x30 seconds B Corner stretch 2x30 seconds B Scap retractions x12 green TB 2 second hold  Shoulder extension from shoulder height green TB x12 2 second hold  Backward shoulder rolls x15 (up back down) Thoracic extensions x15     Manual   MFR R upper trap to tolerance     06/06/23 Manual therapy STM & TPR UT/paraspinals Skilled asssessment and palpation for TPDN Trigger Point Dry-Needling  Treatment instructions: Expect mild to moderate muscle soreness. S/S of pneumothorax if dry needled over a lung field, and to seek immediate medical attention should they occur. Patient verbalized understanding of these instructions and education.  Patient Consent Given: Yes Education handout provided: Yes Muscles treated: R UT, R cervical paraspinals Electrical stimulation performed: No Parameters: N/A Treatment response/outcome: Twitch response ilicited, decreased muscle tension See HEP below   PATIENT  EDUCATION:  Education details: Exam findings, POC, initial HEP Person educated: Patient Education method: Explanation, Demonstration, and Handouts Education comprehension: verbalized understanding, returned demonstration, and needs further education  HOME EXERCISE PROGRAM: Access Code: [email or web address] URL: [email or web address] Date: 06/06/2023 Prepared by: Vernon Prey April Kirstie Peri  Exercises - Seated Gentle Upper Trapezius Stretch  - 1 x daily - 7 x weekly - 2 sets - 30 sec hold - Seated Levator Scapulae Stretch  - 1 x daily - 7 x weekly - 2 sets - 30 sec hold - Ulnar Nerve Mobilization - Low Level  - 1 x daily - 7 x weekly - 1 sets - 10 reps - Seated Cervical Retraction  - 1 x daily - 7 x weekly - 2 sets - 10  reps - Shoulder External Rotation in 45 Degrees Abduction  - 1 x daily - 7 x weekly - 2 sets - 10 reps  ASSESSMENT:  CLINICAL IMPRESSION:  Charlesia arrives today doing well, sounds like dry needling especially really helped her pain and HEP is giving her some relief at night. We focused on exercise based interventions today, will plan on another round of DN when she works with a Social worker. Tolerating PT well, will continue to progress as able. Pain and tingling/numbness improved to less than 1/10 at EOS.   OBJECTIVE IMPAIRMENTS: decreased activity tolerance, decreased endurance, decreased mobility, decreased ROM, decreased strength, increased fascial restrictions, impaired sensation, impaired UE functional use, improper body mechanics, postural dysfunction, and pain.   ACTIVITY LIMITATIONS: carrying, lifting, sleeping, toileting, dressing, reach over head, hygiene/grooming, and caring for others  PARTICIPATION LIMITATIONS: cleaning, laundry, community activity, occupation, and yard work  PERSONAL FACTORS: Age, Fitness, Past/current experiences, and Time since onset of injury/illness/exacerbation are also affecting patient's functional outcome.   REHAB POTENTIAL:  Good  CLINICAL DECISION MAKING: Stable/uncomplicated  EVALUATION COMPLEXITY: Low   GOALS: Goals reviewed with patient? Yes  SHORT TERM GOALS: Target date: 07/04/2023   Pt will be ind with her HEP Baseline:  Goal status: INITIAL  2.  Pt will have full pain free shoulder ROM Baseline:  Goal status: INITIAL   LONG TERM GOALS: Target date: 08/01/2023   Pt will be ind with management and progression of HEP Baseline:  Goal status: INITIAL  2.  Pt will have improved FOTO score to >/=69 Baseline: 50 Goal status: INITIAL  3.  Pt will be able to lift and carry at least 5# overhead without pain Baseline:  Goal status: INITIAL  4.  Pt will report at least 50% resolution of middle finger N/T and night pain Baseline:  Goal status: INITIAL  5.  Pt will have full pain free cervical ROM Baseline:  Goal status: INITIAL    PLAN:  PT FREQUENCY: 1-2x/week  PT DURATION: 8 weeks  PLANNED INTERVENTIONS: Therapeutic exercises, Therapeutic activity, Neuromuscular re-education, Balance training, Gait training, Patient/Family education, Self Care, Joint mobilization, Aquatic Therapy, Dry Needling, Electrical stimulation, Spinal manipulation, Spinal mobilization, Cryotherapy, Moist heat, Taping, Traction, Ionotophoresis 4mg /ml Dexamethasone, Manual therapy, and Re-evaluation  PLAN FOR NEXT SESSION:  Continue TPDN/manual work as indicated. Traction (?) Nerve glide, postural stability, neck stabilizer strengthening  Nedra Hai, PT, DPT 06/08/23 9:27 AM

## 2023-06-13 ENCOUNTER — Ambulatory Visit: Payer: BC Managed Care – PPO

## 2023-06-13 DIAGNOSIS — M5412 Radiculopathy, cervical region: Secondary | ICD-10-CM | POA: Diagnosis not present

## 2023-06-13 DIAGNOSIS — R293 Abnormal posture: Secondary | ICD-10-CM | POA: Diagnosis not present

## 2023-06-13 DIAGNOSIS — M542 Cervicalgia: Secondary | ICD-10-CM | POA: Diagnosis not present

## 2023-06-13 DIAGNOSIS — M6281 Muscle weakness (generalized): Secondary | ICD-10-CM | POA: Diagnosis not present

## 2023-06-13 NOTE — Therapy (Signed)
OUTPATIENT PHYSICAL THERAPY CERVICAL TREATMENT   Patient Name: Sarah Benjamin MRN: [id number] DOB:1971-08-29, 52 y.o., female Today's Date: 06/13/2023  END OF SESSION:  PT End of Session - 06/13/23 0804     Visit Number 3    Number of Visits 16    Date for PT Re-Evaluation 08/01/23    Authorization Type BCBS    PT Start Time 0805    PT Stop Time 0845    PT Time Calculation (min) 40 min    Activity Tolerance Patient tolerated treatment well    Behavior During Therapy [facility] for tasks assessed/performed              Past Medical History:  Diagnosis Date   Asthma    during pollen season, follows w/ PCP   Bell's palsy    as a younger woman   Chest pain    12/09/20 cardiology visit with Juliane Lack, MD. Pain thought to be musculoskeletal in nature / no furthing testing.   Complication of anesthesia    12/2022 - Patient stated that her O2 level dropped after surgery and she had to stay in the hospital overnight. Per MD note, patient had respiratory depression due to pain medication and was kept overnight.   COVID 2020   with pneumonia   Heart murmur    no problems from murmur per pt   History of PCOS    Hypertension    Follows w/ PCP.   Hypothyroidism    Follows w/ PCP.   Lumbar back pain    mild L4 - L5 DDD, very mild spinal stenosis   Paresthesia and pain of extremity    right hand - carpal tunnel syndrome   Pneumonia 2020   with Covid   Pre-diabetes    Vaginal Pap smear, abnormal    Wears glasses    Past Surgical History:  Procedure Laterality Date   COLONOSCOPY  01/14/2022   diverticula in sigmoid colon, 2 sessile polyps   CYSTOSCOPY N/A 05/10/2023   Procedure: CYSTOSCOPY;  Surgeon: Milas Hock, MD;  Location: [facility] [place];  Service: Gynecology;  Laterality: N/A;   ENDOMETRIAL ABLATION  2010   ESSURE TUBAL LIGATION Left    around 2011   HYSTEROSCOPY N/A 01/11/2023   Procedure: HYSTEROSCOPY;  Surgeon: Milas Hock, MD;  Location: [facility] OR;   Service: Gynecology;  Laterality: N/A;   HYSTEROSCOPY WITH D & C N/A 01/11/2023   Procedure: DILATATION AND CURETTAGE;  Surgeon: Milas Hock, MD;  Location: [facility] OR;  Service: Gynecology;  Laterality: N/A;   TOTAL LAPAROSCOPIC HYSTERECTOMY WITH SALPINGECTOMY Bilateral 05/10/2023   Procedure: TOTAL LAPAROSCOPIC HYSTERECTOMY WITH BILATERAL SALPINGECTOMY;  Surgeon: Milas Hock, MD;  Location: [facility] [place];  Service: Gynecology;  Laterality: Bilateral;   WISDOM TOOTH EXTRACTION     Patient Active Problem List   Diagnosis Date Noted   Cervical radiculopathy 08/31/2022   Acute bacterial sinusitis 08/31/2022   Lumbar back pain with radiculopathy affecting lower extremity 03/04/2022   Well adult exam 10/05/2021   Impingement syndrome, shoulder, right 09/29/2021   Stress fracture of metatarsal bone of left foot 07/08/2020   Allergic rhinitis 06/12/2018   Transaminitis 06/12/2018   BPPV (benign paroxysmal positional vertigo), right 06/04/2018   Essential hypertension 02/12/2018   Numbness and tingling of left side of face 02/12/2018   Prediabetes 06/21/2017   Irregular menses 03/30/2017   Dizziness 04/19/2016   Seasonal allergies 04/19/2016   Sinus headache 04/19/2016   History of Bell's palsy 04/19/2016  PFO (patent foramen ovale) 01/12/2016   Pain in the chest 01/12/2016   Hip pain 04/17/2015   Mild persistent asthma without complication 02/18/2015   Hypothyroid 01/13/2014   Constipation 01/13/2014   Migraine 01/13/2014    PCP: Everrett Coombe, DO  REFERRING PROVIDER: Monica Becton, MD  REFERRING DIAG: [phone] (ICD-10-CM) - Cervical radiculopathy  THERAPY DIAG:  Radiculopathy, cervical region  Cervicalgia  Muscle weakness (generalized)  Abnormal posture  Rationale for Evaluation and Treatment: Rehabilitation  ONSET DATE: ~10 days  SUBJECTIVE:                                                                                                                                                                                                          SUBJECTIVE STATEMENT: Patient reports the tingling down arm into middle finger is much better, only has tingling in the fingertip. Patient states she has pain/discomfort in R upper trap area. Patient states she is sleeping much better. Patient states DN at eval made a significant difference and would like another treatment.  Hand dominance: Right  PERTINENT HISTORY:  Hysterectomy 4 weeks ago  PAIN:  Are you having pain? Yes: NPRS scale: 02/21/09 Pain location: plantar aspect of R middle finger Pain description: Burning, N/T Aggravating factors: Night Relieving factors: Prednisone  PRECAUTIONS: None  RED FLAGS: None     WEIGHT BEARING RESTRICTIONS: No  FALLS:  Has patient fallen in last 6 months? No  LIVING ENVIRONMENT: Lives with:  daughters Lives in: House/apartment  OCCUPATION: Works at [employer] -- supposed to go back in 2 weeks  PLOF: Independent  PATIENT GOALS: Improve N/T and neck pain for return to work  NEXT MD VISIT: n/a  OBJECTIVE:   DIAGNOSTIC FINDINGS:  Cervical x-ray performed 06/05/23 -- results pending  PATIENT SURVEYS:  FOTO 50; predicted 69  COGNITION: Overall cognitive status: Within functional limits for tasks assessed  SENSATION: Plantar surface of R middle finger  POSTURE: rounded shoulders and forward head  PALPATION: TTP cervicothoracic paraspinal and UTs   CERVICAL ROM:   Active ROM A/PROM (deg) eval  Flexion 25*  Extension 28  Right lateral flexion 45  Left lateral flexion 45*  Right rotation 45  Left rotation 40*   (Blank rows = not tested) * = concordant pain  UPPER EXTREMITY ROM:  Active ROM Right eval Left eval  Shoulder flexion WNL WNL  Shoulder extension WNL WNL  Shoulder abduction WNL * WNL  Shoulder adduction    Shoulder internal rotation WNL * WNL  Shoulder external rotation WNL WNL  Elbow flexion     Elbow extension    Wrist flexion    Wrist extension    Wrist ulnar deviation    Wrist radial deviation    Wrist pronation    Wrist supination     (Blank rows = not tested) * = concordant pain  UPPER EXTREMITY MMT:  MMT Right eval Left eval  Shoulder flexion 5 5  Shoulder extension 5 5  Shoulder abduction 5 5  Shoulder adduction    Shoulder extension    Shoulder internal rotation 5 5  Shoulder external rotation 5 5  Middle trapezius 3+ 4  Lower trapezius 3 3+  Elbow flexion    Elbow extension    Wrist flexion    Wrist extension    Wrist ulnar deviation    Wrist radial deviation    Wrist pronation    Wrist supination    Grip strength 45 lbs 45 lbs   (Blank rows = not tested)  CERVICAL SPECIAL TESTS:  Upper limb tension test (ULTT): Positive ulnar nerve  FUNCTIONAL TESTS:  Did not assess  TODAY'S TREATMENT:                                                                                                                              DATE:   [facility] Adult PT Treatment:                                                DATE: 06/13/2023 Therapeutic Exercise: UBE L3 x fwd/2 min bkwd Supine cervical retraction (towel roll) 10x5" Seated: Shoulder shrugs --> bkwd shoulder circles x10 each UT stretch (hand on head, added wave arm) 3-way doorway pec stretch low/me/high x30" each Slow rows GTB x10 Shoulder extension (low setting) GTB x10 Platysma stretch SCM stretch Manual Therapy: STM (R>L) cervical paraspinals, cervical upglides, SO/SOR, UT/LS, SCM, scalenes   06/08/23  TherEx  UBE L3 x3 min forward/3 min backward Upper trap stretch 2x30 seconds B Levator stretch 2x30 seconds B Scalene stretch 2x30 seconds B Corner stretch 2x30 seconds B Scap retractions x12 green TB 2 second hold  Shoulder extension from shoulder height green TB x12 2 second hold  Backward shoulder rolls x15 (up back down) Thoracic extensions x15     Manual   MFR R upper trap to  tolerance     06/06/23 Manual therapy STM & TPR UT/paraspinals Skilled asssessment and palpation for TPDN Trigger Point Dry-Needling  Treatment instructions: Expect mild to moderate muscle soreness. S/S of pneumothorax if dry needled over a lung field, and to seek immediate medical attention should they occur. Patient verbalized understanding of these instructions and education.  Patient Consent Given: Yes Education handout provided: Yes Muscles treated: R UT, R cervical paraspinals Electrical stimulation performed: No Parameters: N/A Treatment response/outcome: Twitch response ilicited, decreased muscle tension  See HEP below   PATIENT EDUCATION:  Education details: Exam findings, POC, initial HEP Person educated: Patient Education method: Explanation, Demonstration, and Handouts Education comprehension: verbalized understanding, returned demonstration, and needs further education  HOME EXERCISE PROGRAM: Access Code: [email or web address] URL: [email or web address] Date: 06/13/2023 Prepared by: Carlynn Herald  Exercises - Seated Levator Scapulae Stretch  - 1 x daily - 7 x weekly - 2 sets - 30 sec hold - Ulnar Nerve Mobilization - Low Level  - 1 x daily - 7 x weekly - 1 sets - 10 reps - Seated Cervical Retraction  - 1 x daily - 7 x weekly - 2 sets - 10 reps - Shoulder External Rotation in 45 Degrees Abduction  - 1 x daily - 7 x weekly - 2 sets - 10 reps - Sternocleidomastoid Stretch  - 1 x daily - 7 x weekly - 3 sets - 10 reps - Seated Upper Trapezius Stretch  - 1 x daily - 7 x weekly - 3 sets - 10 reps - Platysma Stretch  - 1 x daily - 7 x weekly - 3 sets - 10 reps  ASSESSMENT:  CLINICAL IMPRESSION:  Significant tightness noted in suboccipitals and upper trapezius (R>L) during manual treatment. Tactile cues provided to decrease rounded forward shoulder compensation during resisted rows and shoulder extension.    OBJECTIVE IMPAIRMENTS: decreased activity tolerance,  decreased endurance, decreased mobility, decreased ROM, decreased strength, increased fascial restrictions, impaired sensation, impaired UE functional use, improper body mechanics, postural dysfunction, and pain.   ACTIVITY LIMITATIONS: carrying, lifting, sleeping, toileting, dressing, reach over head, hygiene/grooming, and caring for others  PARTICIPATION LIMITATIONS: cleaning, laundry, community activity, occupation, and yard work  PERSONAL FACTORS: Age, Fitness, Past/current experiences, and Time since onset of injury/illness/exacerbation are also affecting patient's functional outcome.   REHAB POTENTIAL: Good  CLINICAL DECISION MAKING: Stable/uncomplicated  EVALUATION COMPLEXITY: Low   GOALS: Goals reviewed with patient? Yes  SHORT TERM GOALS: Target date: 07/04/2023  Pt will be ind with her HEP Baseline:  Goal status: INITIAL  2.  Pt will have full pain free shoulder ROM Baseline:  Goal status: INITIAL   LONG TERM GOALS: Target date: 08/01/2023  Pt will be ind with management and progression of HEP Baseline:  Goal status: INITIAL  2.  Pt will have improved FOTO score to >/=69 Baseline: 50 Goal status: INITIAL  3.  Pt will be able to lift and carry at least 5# overhead without pain Baseline:  Goal status: INITIAL  4.  Pt will report at least 50% resolution of middle finger N/T and night pain Baseline:  Goal status: INITIAL  5.  Pt will have full pain free cervical ROM Baseline:  Goal status: INITIAL    PLAN:  PT FREQUENCY: 1-2x/week  PT DURATION: 8 weeks  PLANNED INTERVENTIONS: Therapeutic exercises, Therapeutic activity, Neuromuscular re-education, Balance training, Gait training, Patient/Family education, Self Care, Joint mobilization, Aquatic Therapy, Dry Needling, Electrical stimulation, Spinal manipulation, Spinal mobilization, Cryotherapy, Moist heat, Taping, Traction, Ionotophoresis 4mg /ml Dexamethasone, Manual therapy, and Re-evaluation  PLAN FOR  NEXT SESSION:  DN next visit. Continue TPDN/manual work as indicated. Traction (?) Nerve glide, postural stability, neck stabilizer strengthening  Carlynn Herald, PTA 06/13/2023 8:45 AM

## 2023-06-14 ENCOUNTER — Ambulatory Visit: Payer: BC Managed Care – PPO | Admitting: Physical Therapy

## 2023-06-14 ENCOUNTER — Encounter: Payer: Self-pay | Admitting: Physical Therapy

## 2023-06-14 DIAGNOSIS — R293 Abnormal posture: Secondary | ICD-10-CM | POA: Diagnosis not present

## 2023-06-14 DIAGNOSIS — M542 Cervicalgia: Secondary | ICD-10-CM

## 2023-06-14 DIAGNOSIS — M6281 Muscle weakness (generalized): Secondary | ICD-10-CM

## 2023-06-14 DIAGNOSIS — M5412 Radiculopathy, cervical region: Secondary | ICD-10-CM

## 2023-06-14 NOTE — Therapy (Signed)
OUTPATIENT PHYSICAL THERAPY CERVICAL TREATMENT   Patient Name: Sarah Benjamin MRN: [id number] DOB:Sep 08, 1971, 52 y.o., female Today's Date: 06/14/2023  END OF SESSION:  PT End of Session - 06/14/23 0804     Visit Number 4    Number of Visits 16    Date for PT Re-Evaluation 08/01/23    Authorization Type BCBS    PT Start Time 0804    PT Stop Time 0845    PT Time Calculation (min) 41 min    Activity Tolerance Patient tolerated treatment well    Behavior During Therapy [facility] for tasks assessed/performed             Past Medical History:  Diagnosis Date   Asthma    during pollen season, follows w/ PCP   Bell's palsy    as a younger woman   Chest pain    12/09/20 cardiology visit with Juliane Lack, MD. Pain thought to be musculoskeletal in nature / no furthing testing.   Complication of anesthesia    12/2022 - Patient stated that her O2 level dropped after surgery and she had to stay in the hospital overnight. Per MD note, patient had respiratory depression due to pain medication and was kept overnight.   COVID 2020   with pneumonia   Heart murmur    no problems from murmur per pt   History of PCOS    Hypertension    Follows w/ PCP.   Hypothyroidism    Follows w/ PCP.   Lumbar back pain    mild L4 - L5 DDD, very mild spinal stenosis   Paresthesia and pain of extremity    right hand - carpal tunnel syndrome   Pneumonia 2020   with Covid   Pre-diabetes    Vaginal Pap smear, abnormal    Wears glasses    Past Surgical History:  Procedure Laterality Date   COLONOSCOPY  01/14/2022   diverticula in sigmoid colon, 2 sessile polyps   CYSTOSCOPY N/A 05/10/2023   Procedure: CYSTOSCOPY;  Surgeon: Milas Hock, MD;  Location: [facility] [place];  Service: Gynecology;  Laterality: N/A;   ENDOMETRIAL ABLATION  2010   ESSURE TUBAL LIGATION Left    around 2011   HYSTEROSCOPY N/A 01/11/2023   Procedure: HYSTEROSCOPY;  Surgeon: Milas Hock, MD;  Location: [facility] OR;   Service: Gynecology;  Laterality: N/A;   HYSTEROSCOPY WITH D & C N/A 01/11/2023   Procedure: DILATATION AND CURETTAGE;  Surgeon: Milas Hock, MD;  Location: [facility] OR;  Service: Gynecology;  Laterality: N/A;   TOTAL LAPAROSCOPIC HYSTERECTOMY WITH SALPINGECTOMY Bilateral 05/10/2023   Procedure: TOTAL LAPAROSCOPIC HYSTERECTOMY WITH BILATERAL SALPINGECTOMY;  Surgeon: Milas Hock, MD;  Location: [facility] [place];  Service: Gynecology;  Laterality: Bilateral;   WISDOM TOOTH EXTRACTION     Patient Active Problem List   Diagnosis Date Noted   Cervical radiculopathy 08/31/2022   Acute bacterial sinusitis 08/31/2022   Lumbar back pain with radiculopathy affecting lower extremity 03/04/2022   Well adult exam 10/05/2021   Impingement syndrome, shoulder, right 09/29/2021   Stress fracture of metatarsal bone of left foot 07/08/2020   Allergic rhinitis 06/12/2018   Transaminitis 06/12/2018   BPPV (benign paroxysmal positional vertigo), right 06/04/2018   Essential hypertension 02/12/2018   Numbness and tingling of left side of face 02/12/2018   Prediabetes 06/21/2017   Irregular menses 03/30/2017   Dizziness 04/19/2016   Seasonal allergies 04/19/2016   Sinus headache 04/19/2016   History of Bell's palsy 04/19/2016  PFO (patent foramen ovale) 01/12/2016   Pain in the chest 01/12/2016   Hip pain 04/17/2015   Mild persistent asthma without complication 02/18/2015   Hypothyroid 01/13/2014   Constipation 01/13/2014   Migraine 01/13/2014    PCP: Everrett Coombe, DO  REFERRING PROVIDER: Monica Becton, MD  REFERRING DIAG: [phone] (ICD-10-CM) - Cervical radiculopathy  THERAPY DIAG:  Radiculopathy, cervical region  Cervicalgia  Muscle weakness (generalized)  Abnormal posture  Rationale for Evaluation and Treatment: Rehabilitation  ONSET DATE: ~10 days  SUBJECTIVE:                                                                                                                                                                                                          SUBJECTIVE STATEMENT: "So far so good. No pain this morning. Just a little numbness on the tip of the finger. I've been sleeping better."  Hand dominance: Right  PERTINENT HISTORY:  Hysterectomy 4 weeks ago  PAIN:  Are you having pain? Yes: NPRS scale: 0/10 Pain location: plantar aspect of R middle finger Pain description: Burning, N/T Aggravating factors: Night Relieving factors: Prednisone  PRECAUTIONS: None -- no lifting >10-15 lbs due to hysterectomy, limit bending  RED FLAGS: None   WEIGHT BEARING RESTRICTIONS: No  FALLS:  Has patient fallen in last 6 months? No  LIVING ENVIRONMENT: Lives with:  daughters Lives in: House/apartment  OCCUPATION: Works at [employer] -- supposed to go back in 2 weeks  PLOF: Independent  PATIENT GOALS: Improve N/T and neck pain for return to work  NEXT MD VISIT: n/a  OBJECTIVE:   DIAGNOSTIC FINDINGS:  Cervical x-ray performed 06/05/23 -- results pending  PATIENT SURVEYS:  FOTO 50; predicted 69  COGNITION: Overall cognitive status: Within functional limits for tasks assessed  SENSATION: Plantar surface of R middle finger  POSTURE: rounded shoulders and forward head  PALPATION: TTP cervicothoracic paraspinal and UTs   CERVICAL ROM:   Active ROM A/PROM (deg) eval  Flexion 25*  Extension 28  Right lateral flexion 45  Left lateral flexion 45*  Right rotation 45  Left rotation 40*   (Blank rows = not tested) * = concordant pain  UPPER EXTREMITY ROM:  Active ROM Right eval Left eval  Shoulder flexion WNL WNL  Shoulder extension WNL WNL  Shoulder abduction WNL * WNL  Shoulder adduction    Shoulder internal rotation WNL * WNL  Shoulder external rotation WNL WNL  Elbow flexion    Elbow extension    Wrist flexion    Wrist extension  Wrist ulnar deviation    Wrist radial deviation    Wrist pronation    Wrist  supination     (Blank rows = not tested) * = concordant pain  UPPER EXTREMITY MMT:  MMT Right eval Left eval  Shoulder flexion 5 5  Shoulder extension 5 5  Shoulder abduction 5 5  Shoulder adduction    Shoulder extension    Shoulder internal rotation 5 5  Shoulder external rotation 5 5  Middle trapezius 3+ 4  Lower trapezius 3 3+  Elbow flexion    Elbow extension    Wrist flexion    Wrist extension    Wrist ulnar deviation    Wrist radial deviation    Wrist pronation    Wrist supination    Grip strength 45 lbs 45 lbs   (Blank rows = not tested)  CERVICAL SPECIAL TESTS:  Upper limb tension test (ULTT): Positive ulnar nerve  FUNCTIONAL TESTS:  Did not assess                                                                                                                             [facility] Adult PT Treatment:                                                DATE: 06/14/23 Therapeutic Exercise: Doorway pec stretch low, mid, high x30 sec each UT stretch x 30 sec Levator scap stretch x 30 sec Row green TB x15 (limited due to pulling on hysterectomy) Shoulder ext green TB x10 Shoulder ER red TB 2x10 "W" 2x10 Shoulder horizontal abd 2x10 Ulnar nerve glide x10 Manual Therapy: STM & TPR R UT Skilled assessment and palpation for TPDN Trigger Point Dry-Needling  Treatment instructions: Expect mild to moderate muscle soreness. S/S of pneumothorax if dry needled over a lung field, and to seek immediate medical attention should they occur. Patient verbalized understanding of these instructions and education. Patient Consent Given: Yes Education handout provided: Previously provided Muscles treated: R UT Electrical stimulation performed: No Parameters: N/A Treatment response/outcome: Twitch response illicited, decreased muscle tension Modalities: TENS to pt tolerance on UT x 15 min with moist heat pack  [facility] Adult PT Treatment:                                                DATE:  06/13/2023 Therapeutic Exercise: UBE L3 x fwd/2 min bkwd Supine cervical retraction (towel roll) 10x5" Seated: Shoulder shrugs --> bkwd shoulder circles x10 each UT stretch (hand on head, added wave arm) 3-way doorway pec stretch low/me/high x30" each Slow rows GTB x10 Shoulder extension (low setting) GTB x10 Platysma stretch SCM stretch Manual Therapy: STM (R>L)  cervical paraspinals, cervical upglides, SO/SOR, UT/LS, SCM, scalenes   06/08/23 TherEx UBE L3 x3 min forward/3 min backward Upper trap stretch 2x30 seconds B Levator stretch 2x30 seconds B Scalene stretch 2x30 seconds B Corner stretch 2x30 seconds B Scap retractions x12 green TB 2 second hold  Shoulder extension from shoulder height green TB x12 2 second hold  Backward shoulder rolls x15 (up back down) Thoracic extensions x15   Manual  MFR R upper trap to tolerance     PATIENT EDUCATION:  Education details: Exam findings, POC, initial HEP Person educated: Patient Education method: Explanation, Demonstration, and Handouts Education comprehension: verbalized understanding, returned demonstration, and needs further education  HOME EXERCISE PROGRAM: Access Code: [email or web address] URL: [email or web address] Date: 06/13/2023 Prepared by: Carlynn Herald  Exercises - Seated Levator Scapulae Stretch  - 1 x daily - 7 x weekly - 2 sets - 30 sec hold - Ulnar Nerve Mobilization - Low Level  - 1 x daily - 7 x weekly - 1 sets - 10 reps - Seated Cervical Retraction  - 1 x daily - 7 x weekly - 2 sets - 10 reps - Shoulder External Rotation in 45 Degrees Abduction  - 1 x daily - 7 x weekly - 2 sets - 10 reps - Sternocleidomastoid Stretch  - 1 x daily - 7 x weekly - 3 sets - 10 reps - Seated Upper Trapezius Stretch  - 1 x daily - 7 x weekly - 3 sets - 10 reps - Platysma Stretch  - 1 x daily - 7 x weekly - 3 sets - 10 reps  ASSESSMENT:  CLINICAL IMPRESSION:  Pt notes most of tightness remains in her R UT.  Provided TPDN as this was helpful on initial eval. Focused primarily on scapular/postural stability to decrease UT overactivity. Pt continues to progress well with her exercises.    OBJECTIVE IMPAIRMENTS: decreased activity tolerance, decreased endurance, decreased mobility, decreased ROM, decreased strength, increased fascial restrictions, impaired sensation, impaired UE functional use, improper body mechanics, postural dysfunction, and pain.    GOALS: Goals reviewed with patient? Yes  SHORT TERM GOALS: Target date: 07/04/2023  Pt will be ind with her HEP Baseline:  Goal status: INITIAL  2.  Pt will have full pain free shoulder ROM Baseline:  Goal status: INITIAL   LONG TERM GOALS: Target date: 08/01/2023  Pt will be ind with management and progression of HEP Baseline:  Goal status: INITIAL  2.  Pt will have improved FOTO score to >/=69 Baseline: 50 Goal status: INITIAL  3.  Pt will be able to lift and carry at least 5# overhead without pain Baseline:  Goal status: INITIAL  4.  Pt will report at least 50% resolution of middle finger N/T and night pain Baseline:  Goal status: INITIAL  5.  Pt will have full pain free cervical ROM Baseline:  Goal status: INITIAL    PLAN:  PT FREQUENCY: 1-2x/week  PT DURATION: 8 weeks  PLANNED INTERVENTIONS: Therapeutic exercises, Therapeutic activity, Neuromuscular re-education, Balance training, Gait training, Patient/Family education, Self Care, Joint mobilization, Aquatic Therapy, Dry Needling, Electrical stimulation, Spinal manipulation, Spinal mobilization, Cryotherapy, Moist heat, Taping, Traction, Ionotophoresis 4mg /ml Dexamethasone, Manual therapy, and Re-evaluation  PLAN FOR NEXT SESSION:  Continue TPDN/manual work as indicated. Traction (?) Nerve glide, postural stability, neck stabilizer strengthening  Haeden Hudock April Ma L Vennie Salsbury, PT 06/14/2023 8:04 AM

## 2023-06-19 ENCOUNTER — Ambulatory Visit: Payer: BC Managed Care – PPO | Attending: Sports Medicine | Admitting: Rehabilitative and Restorative Service Providers"

## 2023-06-19 ENCOUNTER — Encounter: Payer: Self-pay | Admitting: Rehabilitative and Restorative Service Providers"

## 2023-06-19 DIAGNOSIS — M542 Cervicalgia: Secondary | ICD-10-CM | POA: Diagnosis present

## 2023-06-19 DIAGNOSIS — R293 Abnormal posture: Secondary | ICD-10-CM | POA: Diagnosis present

## 2023-06-19 DIAGNOSIS — M5412 Radiculopathy, cervical region: Secondary | ICD-10-CM | POA: Insufficient documentation

## 2023-06-19 DIAGNOSIS — M6281 Muscle weakness (generalized): Secondary | ICD-10-CM | POA: Insufficient documentation

## 2023-06-19 NOTE — Therapy (Signed)
OUTPATIENT PHYSICAL THERAPY CERVICAL TREATMENT   Patient Name: Sarah Benjamin MRN: [id number] DOB:06/26/1971, 52 y.o., female Today's Date: 06/19/2023  END OF SESSION:  PT End of Session - 06/19/23 0933     Visit Number 5    Number of Visits 16    Date for PT Re-Evaluation 08/01/23    Authorization Type BCBS    PT Start Time 0930    PT Stop Time 1015    PT Time Calculation (min) 45 min    Activity Tolerance Patient tolerated treatment well             Past Medical History:  Diagnosis Date   Asthma    during pollen season, follows w/ PCP   Bell's palsy    as a younger woman   Chest pain    12/09/20 cardiology visit with Juliane Lack, MD. Pain thought to be musculoskeletal in nature / no furthing testing.   Complication of anesthesia    12/2022 - Patient stated that her O2 level dropped after surgery and she had to stay in the hospital overnight. Per MD note, patient had respiratory depression due to pain medication and was kept overnight.   COVID 2020   with pneumonia   Heart murmur    no problems from murmur per pt   History of PCOS    Hypertension    Follows w/ PCP.   Hypothyroidism    Follows w/ PCP.   Lumbar back pain    mild L4 - L5 DDD, very mild spinal stenosis   Paresthesia and pain of extremity    right hand - carpal tunnel syndrome   Pneumonia 2020   with Covid   Pre-diabetes    Vaginal Pap smear, abnormal    Wears glasses    Past Surgical History:  Procedure Laterality Date   COLONOSCOPY  01/14/2022   diverticula in sigmoid colon, 2 sessile polyps   CYSTOSCOPY N/A 05/10/2023   Procedure: CYSTOSCOPY;  Surgeon: Milas Hock, MD;  Location: [facility] [place];  Service: Gynecology;  Laterality: N/A;   ENDOMETRIAL ABLATION  2010   ESSURE TUBAL LIGATION Left    around 2011   HYSTEROSCOPY N/A 01/11/2023   Procedure: HYSTEROSCOPY;  Surgeon: Milas Hock, MD;  Location: [facility] OR;  Service: Gynecology;  Laterality: N/A;   HYSTEROSCOPY WITH D & C  N/A 01/11/2023   Procedure: DILATATION AND CURETTAGE;  Surgeon: Milas Hock, MD;  Location: [facility] OR;  Service: Gynecology;  Laterality: N/A;   TOTAL LAPAROSCOPIC HYSTERECTOMY WITH SALPINGECTOMY Bilateral 05/10/2023   Procedure: TOTAL LAPAROSCOPIC HYSTERECTOMY WITH BILATERAL SALPINGECTOMY;  Surgeon: Milas Hock, MD;  Location: [facility] [place];  Service: Gynecology;  Laterality: Bilateral;   WISDOM TOOTH EXTRACTION     Patient Active Problem List   Diagnosis Date Noted   Cervical radiculopathy 08/31/2022   Acute bacterial sinusitis 08/31/2022   Lumbar back pain with radiculopathy affecting lower extremity 03/04/2022   Well adult exam 10/05/2021   Impingement syndrome, shoulder, right 09/29/2021   Stress fracture of metatarsal bone of left foot 07/08/2020   Allergic rhinitis 06/12/2018   Transaminitis 06/12/2018   BPPV (benign paroxysmal positional vertigo), right 06/04/2018   Essential hypertension 02/12/2018   Numbness and tingling of left side of face 02/12/2018   Prediabetes 06/21/2017   Irregular menses 03/30/2017   Dizziness 04/19/2016   Seasonal allergies 04/19/2016   Sinus headache 04/19/2016   History of Bell's palsy 04/19/2016   PFO (patent foramen ovale) 01/12/2016   Pain in the  chest 01/12/2016   Hip pain 04/17/2015   Mild persistent asthma without complication 02/18/2015   Hypothyroid 01/13/2014   Constipation 01/13/2014   Migraine 01/13/2014    PCP: Everrett Coombe, DO  REFERRING PROVIDER: Monica Becton, MD  REFERRING DIAG: [phone] (ICD-10-CM) - Cervical radiculopathy  THERAPY DIAG:  Radiculopathy, cervical region  Cervicalgia  Muscle weakness (generalized)  Abnormal posture  Rationale for Evaluation and Treatment: Rehabilitation  ONSET DATE: ~10 days  SUBJECTIVE:                                                                                                                                                                                                          SUBJECTIVE STATEMENT: Some tightness and 1/10 pain in the R shoulder area yesterday but good today. No longer having the tingling or numbness in the R long finger. She is sleeping better now.     Hand dominance: Right  PERTINENT HISTORY:  Hysterectomy 4 weeks ago  PAIN:  Are you having pain? Yes: NPRS scale: 0/10 Pain location: plantar aspect of R middle finger Pain description: none Aggravating factors: Night Relieving factors: Prednisone  PRECAUTIONS: None -- no lifting >10-15 lbs due to hysterectomy, limit bending  RED FLAGS: None   WEIGHT BEARING RESTRICTIONS: No  FALLS:  Has patient fallen in last 6 months? No  LIVING ENVIRONMENT: Lives with:  daughters Lives in: House/apartment  OCCUPATION: Works at [employer] -- supposed to go back in 2 weeks  PLOF: Independent  PATIENT GOALS: Improve N/T and neck pain for return to work  NEXT MD VISIT: n/a  OBJECTIVE:   DIAGNOSTIC FINDINGS:  Cervical x-ray performed 06/05/23 -- results pending  PATIENT SURVEYS:  FOTO 50; predicted 69  SENSATION: Plantar surface of R middle finger  POSTURE: rounded shoulders and forward head  PALPATION: TTP cervicothoracic paraspinal and UTs   CERVICAL ROM:   Active ROM A/PROM (deg) eval  Flexion 25*  Extension 28  Right lateral flexion 45  Left lateral flexion 45*  Right rotation 45  Left rotation 40*   (Blank rows = not tested) * = concordant pain  UPPER EXTREMITY ROM:  Active ROM Right eval Left eval  Shoulder flexion WNL WNL  Shoulder extension WNL WNL  Shoulder abduction WNL * WNL  Shoulder adduction    Shoulder internal rotation WNL * WNL  Shoulder external rotation WNL WNL  Elbow flexion    Elbow extension    Wrist flexion    Wrist extension    Wrist ulnar deviation    Wrist radial deviation  Wrist pronation    Wrist supination     (Blank rows = not tested) * = concordant pain  UPPER EXTREMITY MMT:  MMT  Right eval Left eval  Shoulder flexion 5 5  Shoulder extension 5 5  Shoulder abduction 5 5  Shoulder adduction    Shoulder extension    Shoulder internal rotation 5 5  Shoulder external rotation 5 5  Middle trapezius 3+ 4  Lower trapezius 3 3+  Elbow flexion    Elbow extension    Wrist flexion    Wrist extension    Wrist ulnar deviation    Wrist radial deviation    Wrist pronation    Wrist supination    Grip strength 45 lbs 45 lbs   (Blank rows = not tested)  CERVICAL SPECIAL TESTS:  Upper limb tension test (ULTT): Positive ulnar nerve  FUNCTIONAL TESTS:  Did not assess                                                                                                                             [facility] Adult PT Treatment:                                                DATE: 06/19/23 Therapeutic Exercise: UBE L4 x 4 min alt fwd/back Doorway pec stretch low, mid, high x 30 sec x 2  each UT stretch x 30 sec  x 5  Levator scap stretch x 30 sec x 3  Row green TB x15 (limited due to pulling on hysterectomy) Shoulder ext green TB x 10 Shoulder ER red TB 2 x 10 "W" red TB 3 sec x 2 x 10  Shoulder horizontal abd 2x10 Ulnar nerve glide x 10 Prone axial extension with scap squeeze 5 sec x 5  Manual Therapy: STM & TPR R UT; cervical musculature  Skilled assessment and palpation for TPDN Trigger Point Dry-Needling  Treatment instructions: Expect mild to moderate muscle soreness. S/S of pneumothorax if dry needled over a lung field, and to seek immediate medical attention should they occur. Patient verbalized understanding of these instructions and education. Patient Consent Given: Yes Education handout provided: Previously provided Muscles treated: R UT; cervical paraspinals; upper thoracic paraspinals  Electrical stimulation performed: No Parameters: N/A Treatment response/outcome: Twitch response illicited, decreased muscle tension Modalities: TENS to pt tolerance on UT x 15 min  with moist heat pack Self care: Myofacial ball release standing   [facility] Adult PT Treatment:                                                DATE: 06/14/23 Therapeutic Exercise: Doorway pec stretch low, mid, high x30 sec each UT  stretch x 30 sec Levator scap stretch x 30 sec Row green TB x15 (limited due to pulling on hysterectomy) Shoulder ext green TB x10 Shoulder ER red TB 2x10 "W" 2x10 Shoulder horizontal abd 2x10 Ulnar nerve glide x10 Manual Therapy: STM & TPR R UT Skilled assessment and palpation for TPDN Trigger Point Dry-Needling  Treatment instructions: Expect mild to moderate muscle soreness. S/S of pneumothorax if dry needled over a lung field, and to seek immediate medical attention should they occur. Patient verbalized understanding of these instructions and education. Patient Consent Given: Yes Education handout provided: Previously provided Muscles treated: R UT Electrical stimulation performed: No Parameters: N/A Treatment response/outcome: Twitch response illicited, decreased muscle tension Modalities: TENS to pt tolerance on UT x 15 min with moist heat pack   PATIENT EDUCATION:  Education details: Exam findings, POC, initial HEP Person educated: Patient Education method: Explanation, Demonstration, and Handouts Education comprehension: verbalized understanding, returned demonstration, and needs further education  HOME EXERCISE PROGRAM:  Access Code: [email or web address] URL: [email or web address] Date: 06/19/2023 Prepared by: Corlis Leak  Exercises - Seated Levator Scapulae Stretch  - 1 x daily - 7 x weekly - 2 sets - 30 sec hold - Ulnar Nerve Mobilization - Low Level  - 1 x daily - 7 x weekly - 1 sets - 10 reps - Seated Cervical Retraction  - 1 x daily - 7 x weekly - 2 sets - 10 reps - Shoulder External Rotation in 45 Degrees Abduction  - 1 x daily - 7 x weekly - 2 sets - 10 reps - Sternocleidomastoid Stretch  - 1 x daily - 7 x weekly - 3 sets - 10  reps - Seated Upper Trapezius Stretch  - 1 x daily - 7 x weekly - 3 sets - 10 reps - Platysma Stretch  - 1 x daily - 7 x weekly - 3 sets - 10 reps - Shoulder External Rotation and Scapular Retraction with Resistance  - 1 x daily - 7 x weekly - 2 sets - 10 reps - Shoulder External Rotation in 45 Degrees Abduction  - 1 x daily - 7 x weekly - 2 sets - 10 reps - Shoulder Horizontal Abduction - Thumbs Up  - 1 x daily - 7 x weekly - 3 sets - 10 reps - Shoulder W - External Rotation with Resistance  - 2 x daily - 7 x weekly - 1-2 sets - 10 reps - 3 sec  hold - Standing Bilateral Low Shoulder Row with Anchored Resistance  - 2 x daily - 7 x weekly - 1-3 sets - 10 reps - 2-3 sec  hold - Shoulder extension with resistance - Neutral  - 1 x daily - 7 x weekly - 1-2 sets - 10 reps - 3-5 sec  hold - Standing Posterior Deltoid Release with Ball at Wall  - 2 x daily - 7 x weekly   ASSESSMENT:  CLINICAL IMPRESSION:  Progressing with resolution of R UE symptoms in the long finger. Notes less tightness and improved sleep. She feels she continues to have tightness in the R shoulder and the dry needling really helps. Continued with dry needling and manual work as well as scapular/postural strength and stability. Patient is progressing well toward stated goals of therapy.     OBJECTIVE IMPAIRMENTS: decreased activity tolerance, decreased endurance, decreased mobility, decreased ROM, decreased strength, increased fascial restrictions, impaired sensation, impaired UE functional use, improper body mechanics, postural dysfunction, and pain.    GOALS: Goals reviewed with patient?  Yes  SHORT TERM GOALS: Target date: 07/04/2023  Pt will be ind with her HEP Baseline:  Goal status: INITIAL  2.  Pt will have full pain free shoulder ROM Baseline:  Goal status: INITIAL   LONG TERM GOALS: Target date: 08/01/2023  Pt will be ind with management and progression of HEP Baseline:  Goal status: INITIAL  2.  Pt will  have improved FOTO score to >/=69 Baseline: 50 Goal status: INITIAL  3.  Pt will be able to lift and carry at least 5# overhead without pain Baseline:  Goal status: INITIAL  4.  Pt will report at least 50% resolution of middle finger N/T and night pain Baseline:  Goal status: met  5.  Pt will have full pain free cervical ROM Baseline:  Goal status: INITIAL    PLAN:  PT FREQUENCY: 1-2x/week  PT DURATION: 8 weeks  PLANNED INTERVENTIONS: Therapeutic exercises, Therapeutic activity, Neuromuscular re-education, Balance training, Gait training, Patient/Family education, Self Care, Joint mobilization, Aquatic Therapy, Dry Needling, Electrical stimulation, Spinal manipulation, Spinal mobilization, Cryotherapy, Moist heat, Taping, Traction, Ionotophoresis 4mg /ml Dexamethasone, Manual therapy, and Re-evaluation  PLAN FOR NEXT SESSION:  Continue TPDN/manual work as indicated. Traction (?) Nerve glide, postural stability, neck stabilizer strengthening  Mitesh Rosendahl Rober Minion, PT 06/19/2023 9:34 AM

## 2023-06-21 ENCOUNTER — Encounter: Payer: Self-pay | Admitting: Rehabilitative and Restorative Service Providers"

## 2023-06-21 ENCOUNTER — Ambulatory Visit: Payer: BC Managed Care – PPO | Admitting: Rehabilitative and Restorative Service Providers"

## 2023-06-21 ENCOUNTER — Encounter (INDEPENDENT_AMBULATORY_CARE_PROVIDER_SITE_OTHER): Payer: Self-pay

## 2023-06-21 DIAGNOSIS — M6281 Muscle weakness (generalized): Secondary | ICD-10-CM | POA: Diagnosis not present

## 2023-06-21 DIAGNOSIS — M542 Cervicalgia: Secondary | ICD-10-CM | POA: Diagnosis not present

## 2023-06-21 DIAGNOSIS — M5412 Radiculopathy, cervical region: Secondary | ICD-10-CM | POA: Diagnosis not present

## 2023-06-21 DIAGNOSIS — R293 Abnormal posture: Secondary | ICD-10-CM

## 2023-06-21 NOTE — Therapy (Addendum)
 OUTPATIENT PHYSICAL THERAPY CERVICAL TREATMENT PHYSICAL THERAPY DISCHARGE SUMMARY  Visits from Start of Care: 6  Current functional level related to goals / functional outcomes: See progress note for discharge status    Remaining deficits: Unknown    Education / Equipment: HEP    Patient agrees to discharge. Patient goals were partially met. Patient is being discharged due to not returning since the last visit.  Kinzie Wickes P. Leonor Liv PT, MPH 01/22/24 10:58 AM     Patient Name: Sarah Benjamin MRN: [id number] DOB:02-21-1971, 52 y.o., female Today's Date: 06/21/2023  END OF SESSION:  PT End of Session - 06/21/23 0935     Visit Number 6    Number of Visits 16    Date for PT Re-Evaluation 08/01/23    Authorization Type BCBS    PT Start Time 0932    PT Stop Time 1020    PT Time Calculation (min) 48 min    Activity Tolerance Patient tolerated treatment well             Past Medical History:  Diagnosis Date   Asthma    during pollen season, follows w/ PCP   Bell's palsy    as a younger woman   Chest pain    12/09/20 cardiology visit with Juliane Lack, MD. Pain thought to be musculoskeletal in nature / no furthing testing.   Complication of anesthesia    12/2022 - Patient stated that her O2 level dropped after surgery and she had to stay in the hospital overnight. Per MD note, patient had respiratory depression due to pain medication and was kept overnight.   COVID 2020   with pneumonia   Heart murmur    no problems from murmur per pt   History of PCOS    Hypertension    Follows w/ PCP.   Hypothyroidism    Follows w/ PCP.   Lumbar back pain    mild L4 - L5 DDD, very mild spinal stenosis   Paresthesia and pain of extremity    right hand - carpal tunnel syndrome   Pneumonia 2020   with Covid   Pre-diabetes    Vaginal Pap smear, abnormal    Wears glasses    Past Surgical History:  Procedure Laterality Date   COLONOSCOPY  01/14/2022   diverticula in sigmoid colon,  2 sessile polyps   CYSTOSCOPY N/A 05/10/2023   Procedure: CYSTOSCOPY;  Surgeon: Milas Hock, MD;  Location: [facility] [place];  Service: Gynecology;  Laterality: N/A;   ENDOMETRIAL ABLATION  2010   ESSURE TUBAL LIGATION Left    around 2011   HYSTEROSCOPY N/A 01/11/2023   Procedure: HYSTEROSCOPY;  Surgeon: Milas Hock, MD;  Location: [facility] OR;  Service: Gynecology;  Laterality: N/A;   HYSTEROSCOPY WITH D & C N/A 01/11/2023   Procedure: DILATATION AND CURETTAGE;  Surgeon: Milas Hock, MD;  Location: [facility] OR;  Service: Gynecology;  Laterality: N/A;   TOTAL LAPAROSCOPIC HYSTERECTOMY WITH SALPINGECTOMY Bilateral 05/10/2023   Procedure: TOTAL LAPAROSCOPIC HYSTERECTOMY WITH BILATERAL SALPINGECTOMY;  Surgeon: Milas Hock, MD;  Location: [facility];  Service: Gynecology;  Laterality: Bilateral;   WISDOM TOOTH EXTRACTION     Patient Active Problem List   Diagnosis Date Noted   Cervical radiculopathy 08/31/2022   Acute bacterial sinusitis 08/31/2022   Lumbar back pain with radiculopathy affecting lower extremity 03/04/2022   Well adult exam 10/05/2021   Impingement syndrome, shoulder, right 09/29/2021   Stress fracture of metatarsal bone of left foot 07/08/2020  Allergic rhinitis 06/12/2018   Transaminitis 06/12/2018   BPPV (benign paroxysmal positional vertigo), right 06/04/2018   Essential hypertension 02/12/2018   Numbness and tingling of left side of face 02/12/2018   Prediabetes 06/21/2017   Irregular menses 03/30/2017   Dizziness 04/19/2016   Seasonal allergies 04/19/2016   Sinus headache 04/19/2016   History of Bell's palsy 04/19/2016   PFO (patent foramen ovale) 01/12/2016   Pain in the chest 01/12/2016   Hip pain 04/17/2015   Mild persistent asthma without complication 02/18/2015   Hypothyroid 01/13/2014   Constipation 01/13/2014   Migraine 01/13/2014    PCP: Everrett Coombe, DO  REFERRING PROVIDER: Monica Becton, MD  REFERRING DIAG:  [phone] (ICD-10-CM) - Cervical radiculopathy  THERAPY DIAG:  Radiculopathy, cervical region  Cervicalgia  Muscle weakness (generalized)  Abnormal posture  Rationale for Evaluation and Treatment: Rehabilitation  ONSET DATE: ~10 days  SUBJECTIVE:                                                                                                                                                                                                         SUBJECTIVE STATEMENT: Very sore from dry needling and manual work at last visit. Arm and shoulder feel "heavy"  No longer having the tingling or numbness in the R long finger. She is sleeping better now.     Hand dominance: Right  PERTINENT HISTORY:  Hysterectomy 4 weeks ago  PAIN:  Are you having pain? Yes: NPRS scale: 6-7/10 Pain location: "soreness" Pain description: none Aggravating factors: Night Relieving factors: Prednisone  PRECAUTIONS: None -- no lifting >10-15 lbs due to hysterectomy, limit bending  RED FLAGS: None   WEIGHT BEARING RESTRICTIONS: No  FALLS:  Has patient fallen in last 6 months? No  LIVING ENVIRONMENT: Lives with:  daughters Lives in: House/apartment  OCCUPATION: Works at [employer] -- supposed to go back in 2 weeks  PLOF: Independent  PATIENT GOALS: Improve N/T and neck pain for return to work  NEXT MD VISIT: n/a  OBJECTIVE:   DIAGNOSTIC FINDINGS:  Cervical x-ray performed 06/05/23 -- results pending  PATIENT SURVEYS:  FOTO 50; predicted 69  SENSATION: Plantar surface of R middle finger  POSTURE: rounded shoulders and forward head  PALPATION: TTP cervicothoracic paraspinal and UTs   CERVICAL ROM:   Active ROM A/PROM (deg) eval  Flexion 25*  Extension 28  Right lateral flexion 45  Left lateral flexion 45*  Right rotation 45  Left rotation 40*   (Blank rows = not tested) * = concordant pain  UPPER EXTREMITY  ROM:  Active ROM Right eval Left eval  Shoulder flexion  WNL WNL  Shoulder extension WNL WNL  Shoulder abduction WNL * WNL  Shoulder adduction    Shoulder internal rotation WNL * WNL  Shoulder external rotation WNL WNL  Elbow flexion    Elbow extension    Wrist flexion    Wrist extension    Wrist ulnar deviation    Wrist radial deviation    Wrist pronation    Wrist supination     (Blank rows = not tested) * = concordant pain  UPPER EXTREMITY MMT:  MMT Right eval Left eval  Shoulder flexion 5 5  Shoulder extension 5 5  Shoulder abduction 5 5  Shoulder adduction    Shoulder extension    Shoulder internal rotation 5 5  Shoulder external rotation 5 5  Middle trapezius 3+ 4  Lower trapezius 3 3+  Elbow flexion    Elbow extension    Wrist flexion    Wrist extension    Wrist ulnar deviation    Wrist radial deviation    Wrist pronation    Wrist supination    Grip strength 45 lbs 45 lbs   (Blank rows = not tested)  CERVICAL SPECIAL TESTS:  Upper limb tension test (ULTT): Positive ulnar nerve  FUNCTIONAL TESTS:  Did not assess                                                                                                                             [facility] Adult PT Treatment:                                                DATE: 06/21/23 Therapeutic Exercise: UBE L5 x 4 min alt fwd/back Doorway pec stretch low, mid, high x 30 sec x 2  each UT stretch x 30 sec  x 5  Levator scap stretch x 30 sec x 3  Row green TB x15 (limited due to pulling on hysterectomy) Shoulder ext green TB x 10 Shoulder bilatER red TB 2 x 10 "W" red TB 3 sec x 2 x 10  Shoulder horizontal abd 2x10 Ulnar nerve glide x 10 Prone axial extension with scap squeeze 5 sec x 5  Manual Therapy: STM & TPR R UT; cervical musculature   Modalities: TENS to pt tolerance on UT x 15 min with moist heat pack Self care: Myofacial ball release standing   [facility] Adult PT Treatment:                                                DATE: 06/19/23 Therapeutic Exercise: UBE L4  x 4 min alt fwd/back Doorway pec stretch low, mid,  high x 30 sec x 2  each UT stretch x 30 sec  x 5  Levator scap stretch x 30 sec x 3  Row green TB x15 (limited due to pulling on hysterectomy) Shoulder ext green TB x 10 Shoulder ER red TB 2 x 10 "W" red TB 3 sec x 2 x 10  Shoulder horizontal abd 2x10 Ulnar nerve glide x 10 Prone axial extension with scap squeeze 5 sec x 5  Manual Therapy: STM & TPR R UT; cervical musculature  Skilled assessment and palpation for TPDN Trigger Point Dry-Needling  Treatment instructions: Expect mild to moderate muscle soreness. S/S of pneumothorax if dry needled over a lung field, and to seek immediate medical attention should they occur. Patient verbalized understanding of these instructions and education. Patient Consent Given: Yes Education handout provided: Previously provided Muscles treated: R UT; cervical paraspinals; upper thoracic paraspinals  Electrical stimulation performed: No Parameters: N/A Treatment response/outcome: Twitch response illicited, decreased muscle tension Modalities: TENS to pt tolerance on UT x 15 min with moist heat pack Self care: Myofacial ball release standing    PATIENT EDUCATION:  Education details: Exam findings, POC, initial HEP Person educated: Patient Education method: Explanation, Demonstration, and Handouts Education comprehension: verbalized understanding, returned demonstration, and needs further education  HOME EXERCISE PROGRAM:  Access Code: [email or web address] URL: [email or web address] Date: 06/19/2023 Prepared by: Corlis Leak  Exercises - Seated Levator Scapulae Stretch  - 1 x daily - 7 x weekly - 2 sets - 30 sec hold - Ulnar Nerve Mobilization - Low Level  - 1 x daily - 7 x weekly - 1 sets - 10 reps - Seated Cervical Retraction  - 1 x daily - 7 x weekly - 2 sets - 10 reps - Shoulder External Rotation in 45 Degrees Abduction  - 1 x daily - 7 x weekly - 2 sets - 10 reps - Sternocleidomastoid  Stretch  - 1 x daily - 7 x weekly - 3 sets - 10 reps - Seated Upper Trapezius Stretch  - 1 x daily - 7 x weekly - 3 sets - 10 reps - Platysma Stretch  - 1 x daily - 7 x weekly - 3 sets - 10 reps - Shoulder External Rotation and Scapular Retraction with Resistance  - 1 x daily - 7 x weekly - 2 sets - 10 reps - Shoulder External Rotation in 45 Degrees Abduction  - 1 x daily - 7 x weekly - 2 sets - 10 reps - Shoulder Horizontal Abduction - Thumbs Up  - 1 x daily - 7 x weekly - 3 sets - 10 reps - Shoulder W - External Rotation with Resistance  - 2 x daily - 7 x weekly - 1-2 sets - 10 reps - 3 sec  hold - Standing Bilateral Low Shoulder Row with Anchored Resistance  - 2 x daily - 7 x weekly - 1-3 sets - 10 reps - 2-3 sec  hold - Shoulder extension with resistance - Neutral  - 1 x daily - 7 x weekly - 1-2 sets - 10 reps - 3-5 sec  hold - Standing Posterior Deltoid Release with Ball at Wall  - 2 x daily - 7 x weekly   ASSESSMENT:  CLINICAL IMPRESSION:  Increased soreness in the R upper quarter following dry needling and manual work at last visit. Continued with postural correction and exercises for stretching and strengthening. Continued full resolution of R UE symptoms in the long finger. Notes less tightness and improved sleep.  She feels she continues to have tightness in the R shoulder and the dry needling really helps. Patient is progressing well toward stated goals of therapy.  She returns to work next week and will call to schedule if she feels she needs additional appointments.    OBJECTIVE IMPAIRMENTS: decreased activity tolerance, decreased endurance, decreased mobility, decreased ROM, decreased strength, increased fascial restrictions, impaired sensation, impaired UE functional use, improper body mechanics, postural dysfunction, and pain.    GOALS: Goals reviewed with patient? Yes  SHORT TERM GOALS: Target date: 07/04/2023  Pt will be ind with her HEP Baseline:  Goal status:  INITIAL  2.  Pt will have full pain free shoulder ROM Baseline:  Goal status: INITIAL   LONG TERM GOALS: Target date: 08/01/2023  Pt will be ind with management and progression of HEP Baseline:  Goal status: INITIAL  2.  Pt will have improved FOTO score to >/=69 Baseline: 50 Goal status: INITIAL  3.  Pt will be able to lift and carry at least 5# overhead without pain Baseline:  Goal status: INITIAL  4.  Pt will report at least 50% resolution of middle finger N/T and night pain Baseline:  Goal status: met  5.  Pt will have full pain free cervical ROM Baseline:  Goal status: INITIAL    PLAN:  PT FREQUENCY: 1-2x/week  PT DURATION: 8 weeks  PLANNED INTERVENTIONS: Therapeutic exercises, Therapeutic activity, Neuromuscular re-education, Balance training, Gait training, Patient/Family education, Self Care, Joint mobilization, Aquatic Therapy, Dry Needling, Electrical stimulation, Spinal manipulation, Spinal mobilization, Cryotherapy, Moist heat, Taping, Traction, Ionotophoresis 4mg /ml Dexamethasone, Manual therapy, and Re-evaluation  PLAN FOR NEXT SESSION:  Continue TPDN/manual work as indicated. Traction (?) Nerve glide, postural stability, neck stabilizer strengthening  Carroll Lingelbach Rober Minion, PT 06/21/2023 9:35 AM

## 2023-07-10 ENCOUNTER — Ambulatory Visit (INDEPENDENT_AMBULATORY_CARE_PROVIDER_SITE_OTHER): Payer: BC Managed Care – PPO | Admitting: Physician Assistant

## 2023-07-10 ENCOUNTER — Encounter: Payer: Self-pay | Admitting: Physician Assistant

## 2023-07-10 VITALS — BP 135/53 | HR 60 | Temp 98.5°F | Ht 62.0 in | Wt 225.0 lb

## 2023-07-10 DIAGNOSIS — J019 Acute sinusitis, unspecified: Secondary | ICD-10-CM | POA: Diagnosis not present

## 2023-07-10 DIAGNOSIS — B9689 Other specified bacterial agents as the cause of diseases classified elsewhere: Secondary | ICD-10-CM

## 2023-07-10 DIAGNOSIS — J9801 Acute bronchospasm: Secondary | ICD-10-CM | POA: Insufficient documentation

## 2023-07-10 MED ORDER — AMOXICILLIN-POT CLAVULANATE 875-125 MG PO TABS
1.0000 | ORAL_TABLET | Freq: Two times a day (BID) | ORAL | 0 refills | Status: DC
Start: 2023-07-10 — End: 2023-08-02

## 2023-07-10 MED ORDER — ALBUTEROL SULFATE HFA 108 (90 BASE) MCG/ACT IN AERS
2.0000 | INHALATION_SPRAY | Freq: Four times a day (QID) | RESPIRATORY_TRACT | 0 refills | Status: DC | PRN
Start: 2023-07-10 — End: 2023-08-02

## 2023-07-10 MED ORDER — FLUTICASONE PROPIONATE 50 MCG/ACT NA SUSP
2.0000 | Freq: Every day | NASAL | 0 refills | Status: DC
Start: 2023-07-10 — End: 2023-08-02

## 2023-07-10 NOTE — Patient Instructions (Signed)

## 2023-07-10 NOTE — Progress Notes (Signed)
Acute Office Visit  Subjective:     Patient ID: Sarah Benjamin, female    DOB: 1971/02/05, 52 y.o.   MRN: [id number]  Chief Complaint  Patient presents with   Sinusitis    HPI Patient is in today for sinus pressure, congestion, headache for 8 days. No sick contacts. No fever, chills, body aches. Hx of seasonal allergies and bronchospasm. Needs refills of albuterol. She is blowing out "green" sputum. Taking mucinex D with some relief.   .. Active Ambulatory Problems    Diagnosis Date Noted   PFO (patent foramen ovale) 01/12/2016   Pain in the chest 01/12/2016   Dizziness 04/19/2016   Hypothyroid 01/13/2014   Seasonal allergies 04/19/2016   Sinus headache 04/19/2016   History of Bell's palsy 04/19/2016   Mild persistent asthma without complication 02/18/2015   Constipation 01/13/2014   Essential hypertension 02/12/2018   Hip pain 04/17/2015   Irregular menses 03/30/2017   Migraine 01/13/2014   Numbness and tingling of left side of face 02/12/2018   Prediabetes 06/21/2017   BPPV (benign paroxysmal positional vertigo), right 06/04/2018   Allergic rhinitis 06/12/2018   Transaminitis 06/12/2018   Stress fracture of metatarsal bone of left foot 07/08/2020   Impingement syndrome, shoulder, right 09/29/2021   Well adult exam 10/05/2021   Lumbar back pain with radiculopathy affecting lower extremity 03/04/2022   Cervical radiculopathy 08/31/2022   Acute bacterial sinusitis 08/31/2022   Bronchospasm 07/10/2023   Resolved Ambulatory Problems    Diagnosis Date Noted   Hypothyroidism 02/08/2012   Lumbar spondylosis 04/17/2015   Arthralgia of metacarpophalangeal joint, left third 09/18/2018   Bilateral hand pain 03/04/2022   Finger numbness 08/31/2022   Postmenopausal bleeding 01/11/2023   Postoperative pain 01/11/2023   PMB (postmenopausal bleeding) 05/10/2023   Past Medical History:  Diagnosis Date   Asthma    Bell's palsy    Chest pain    Complication of anesthesia     COVID 2020   Heart murmur    History of PCOS    Hypertension    Lumbar back pain    Paresthesia and pain of extremity    Pneumonia 2020   Pre-diabetes    Vaginal Pap smear, abnormal    Wears glasses      ROS  See HPI.     Objective:    BP (!) 135/53   Pulse 60   Temp 98.5 F (36.9 C)   Ht 5\' 2"  (1.575 m)   Wt 225 lb (102.1 kg)   LMP 01/27/2020   SpO2 95%   BMI 41.15 kg/m  BP Readings from Last 3 Encounters:  07/10/23 (!) 135/53  06/07/23 133/80  05/11/23 107/71   Wt Readings from Last 3 Encounters:  07/10/23 225 lb (102.1 kg)  06/07/23 223 lb (101.2 kg)  05/10/23 218 lb 3.2 oz (99 kg)      Physical Exam Constitutional:      Appearance: Normal appearance. She is obese.  HENT:     Head: Normocephalic.     Right Ear: Tympanic membrane, ear canal and external ear normal. There is no impacted cerumen.     Left Ear: Tympanic membrane, ear canal and external ear normal. There is no impacted cerumen.     Nose: Congestion present.  Eyes:     Pupils: Pupils are equal, round, and reactive to light.  Cardiovascular:     Rate and Rhythm: Normal rate and regular rhythm.  Pulmonary:     Effort: Pulmonary effort is normal.  Breath sounds: Normal breath sounds.  Abdominal:     Palpations: Abdomen is soft.  Musculoskeletal:     Right lower leg: No edema.     Left lower leg: No edema.  Neurological:     General: No focal deficit present.     Mental Status: She is alert and oriented to person, place, and time.  Psychiatric:        Mood and Affect: Mood normal.          Assessment & Plan:  Marland KitchenMarland KitchenAna was seen today for sinusitis.  Diagnoses and all orders for this visit:  Acute bacterial sinusitis -     amoxicillin-clavulanate (AUGMENTIN) 875-125 MG tablet; Take 1 tablet by mouth 2 (two) times daily. -     fluticasone (FLONASE) 50 MCG/ACT nasal spray; Place 2 sprays into both nostrils daily.  Bronchospasm -     albuterol (VENTOLIN HFA) 108 (90 Base)  MCG/ACT inhaler; Inhale 2 puffs into the lungs every 6 (six) hours as needed.   Treated for sinusitis with augmentin and flonase Consider nasal saline rinse Refilled albuterol to use as needed for wheezing and SOB Follow up if not improving or symptoms worsening   Return if symptoms worsen or fail to improve.  Tandy Gaw, PA-C

## 2023-07-18 ENCOUNTER — Ambulatory Visit: Payer: BC Managed Care – PPO | Admitting: Sports Medicine

## 2023-07-21 ENCOUNTER — Telehealth: Payer: Self-pay | Admitting: *Deleted

## 2023-07-21 NOTE — Telephone Encounter (Signed)
Left patient an urgent message to call and schedule 8 week Post Op appointment with Dr. Para March, around 08/02/2023. Please overbook appointment if needed.

## 2023-08-01 NOTE — Progress Notes (Unsigned)
   GYNECOLOGY OFFICE VISIT NOTE  History:   Sarah Benjamin is a 52 y.o. G2P2 here today for postop check following TLH, BS, and cystoscopy on 6/26. ***     The following portions of the patient's history were reviewed and updated as appropriate: allergies, current medications, past family history, past medical history, past social history, past surgical history and problem list.   Health Maintenance:   Diagnosis  Date Value Ref Range Status  10/20/2022   Final   - Negative for intraepithelial lesion or malignancy (NILM)   MXR wnl 09/2022.   Review of Systems:  Pertinent items noted in HPI and remainder of comprehensive ROS otherwise negative.  Physical Exam:  LMP 01/27/2020  CONSTITUTIONAL: Well-developed, well-nourished female in no acute distress.  HEENT:  Normocephalic, atraumatic. External right and left ear normal. No scleral icterus.  NECK: Normal range of motion, supple, no masses noted on observation SKIN: No rash noted. Not diaphoretic. No erythema. No pallor. MUSCULOSKELETAL: Normal range of motion. No edema noted. NEUROLOGIC: Alert and oriented to person, place, and time. Normal muscle tone coordination. No cranial nerve deficit noted. PSYCHIATRIC: Normal mood and affect. Normal behavior. Normal judgment and thought content.  PELVIC: well healed  Labs and Imaging No results found for this or any previous visit (from the past 168 hour(s)). No results found.  Assessment and Plan:   1. Postop check - All restrictions lifted   No orders of the defined types were placed in this encounter.    Routine preventative health maintenance measures emphasized. Please refer to After Visit Summary for other counseling recommendations.   No follow-ups on file.  Milas Hock, MD, FACOG Obstetrician & Gynecologist, [facility] for [facility], [facility]

## 2023-08-02 ENCOUNTER — Ambulatory Visit (INDEPENDENT_AMBULATORY_CARE_PROVIDER_SITE_OTHER): Payer: BC Managed Care – PPO | Admitting: Obstetrics and Gynecology

## 2023-08-02 ENCOUNTER — Encounter: Payer: Self-pay | Admitting: Obstetrics and Gynecology

## 2023-08-02 VITALS — BP 131/86 | HR 83 | Ht 62.0 in | Wt 223.0 lb

## 2023-08-02 DIAGNOSIS — R232 Flushing: Secondary | ICD-10-CM

## 2023-08-02 DIAGNOSIS — Z09 Encounter for follow-up examination after completed treatment for conditions other than malignant neoplasm: Secondary | ICD-10-CM

## 2023-08-02 MED ORDER — ESTRADIOL 0.0375 MG/24HR TD PTWK
0.0375 mg | MEDICATED_PATCH | TRANSDERMAL | 12 refills | Status: DC
Start: 2023-08-02 — End: 2023-11-23

## 2023-08-03 LAB — TSH RFX ON ABNORMAL TO FREE T4: TSH: 0.892 u[IU]/mL (ref 0.450–4.500)

## 2023-09-25 ENCOUNTER — Ambulatory Visit (INDEPENDENT_AMBULATORY_CARE_PROVIDER_SITE_OTHER): Payer: BC Managed Care – PPO | Admitting: Physician Assistant

## 2023-09-25 ENCOUNTER — Encounter: Payer: Self-pay | Admitting: Physician Assistant

## 2023-09-25 ENCOUNTER — Encounter: Payer: Self-pay | Admitting: Obstetrics and Gynecology

## 2023-09-25 VITALS — BP 132/70 | HR 64 | Ht 62.0 in | Wt 219.0 lb

## 2023-09-25 DIAGNOSIS — R110 Nausea: Secondary | ICD-10-CM | POA: Insufficient documentation

## 2023-09-25 DIAGNOSIS — K219 Gastro-esophageal reflux disease without esophagitis: Secondary | ICD-10-CM

## 2023-09-25 DIAGNOSIS — R1011 Right upper quadrant pain: Secondary | ICD-10-CM | POA: Insufficient documentation

## 2023-09-25 MED ORDER — OMEPRAZOLE 40 MG PO CPDR
40.0000 mg | DELAYED_RELEASE_CAPSULE | Freq: Every day | ORAL | 3 refills | Status: DC
Start: 2023-09-25 — End: 2023-10-05

## 2023-09-25 NOTE — Progress Notes (Unsigned)
Acute Office Visit  Subjective:     Patient ID: Sarah Benjamin, female    DOB: [birth year], 52 y.o.   MRN: [id number]  Chief Complaint  Patient presents with   Chest Pain    Right side rib pain,     HPI Patient is in today for right upper and lower abdominal pain for the last 2 weeks. She had total hysterectomy in June 2024. She had no complications. She denies any fever, chills, body aches. The abdominal pain feels like a fullness and burning at times. She has a some acid reflux but has "not been bad" and does not take anything for it. She has not started any new medications. She does not feel like food makes the right upper and lower abdominal pain worse or better. She denies any diarrhea, constipation, melena or hematochezia. She does feel nauseated and does not really have an appetite. She denies any urinary symptoms or troubles. She has not tried anything to make better or worse.  .. Active Ambulatory Problems    Diagnosis Date Noted   PFO (patent foramen ovale) 01/12/2016   Pain in the chest 01/12/2016   Dizziness 04/19/2016   Hypothyroid 01/13/2014   Seasonal allergies 04/19/2016   Sinus headache 04/19/2016   History of Bell's palsy 04/19/2016   Mild persistent asthma without complication 02/18/2015   Constipation 01/13/2014   Essential hypertension 02/12/2018   Hip pain 04/17/2015   Irregular menses 03/30/2017   Migraine 01/13/2014   Numbness and tingling of left side of face 02/12/2018   Prediabetes 06/21/2017   BPPV (benign paroxysmal positional vertigo), right 06/04/2018   Allergic rhinitis 06/12/2018   Transaminitis 06/12/2018   Stress fracture of metatarsal bone of left foot 07/08/2020   Impingement syndrome, shoulder, right 09/29/2021   Well adult exam 10/05/2021   Lumbar back pain with radiculopathy affecting lower extremity 03/04/2022   Cervical radiculopathy 08/31/2022   Acute bacterial sinusitis 08/31/2022   Bronchospasm 07/10/2023   Right upper quadrant  abdominal pain 09/25/2023   Nausea 09/25/2023   Gastroesophageal reflux disease without esophagitis 09/26/2023   Resolved Ambulatory Problems    Diagnosis Date Noted   Hypothyroidism 02/08/2012   Lumbar spondylosis 04/17/2015   Arthralgia of metacarpophalangeal joint, left third 09/18/2018   Bilateral hand pain 03/04/2022   Finger numbness 08/31/2022   Postmenopausal bleeding 01/11/2023   Postoperative pain 01/11/2023   PMB (postmenopausal bleeding) 05/10/2023   Past Medical History:  Diagnosis Date   Asthma    Bell's palsy    Chest pain    Complication of anesthesia    COVID 2020   Heart murmur    History of PCOS    Hypertension    Lumbar back pain    Paresthesia and pain of extremity    Pneumonia 2020   Pre-diabetes    Vaginal Pap smear, abnormal    Wears glasses     ROS See HPI.      Objective:    BP (!) 141/75   Pulse 64   Ht 5\' 2"  (1.575 m)   Wt 219 lb (99.3 kg)   LMP 01/27/2020   SpO2 99%   BMI 40.06 kg/m  BP Readings from Last 3 Encounters:  09/25/23 (!) 141/75  08/02/23 131/86  07/10/23 (!) 135/53   Wt Readings from Last 3 Encounters:  09/25/23 219 lb (99.3 kg)  08/02/23 223 lb (101.2 kg)  07/10/23 225 lb (102.1 kg)      Physical Exam Constitutional:  Appearance: She is well-developed. She is obese.  HENT:     Head: Normocephalic.  Cardiovascular:     Rate and Rhythm: Normal rate and regular rhythm.     Pulses: Normal pulses.     Heart sounds: Normal heart sounds.  Pulmonary:     Effort: Pulmonary effort is normal.     Breath sounds: Normal breath sounds.  Abdominal:     General: Bowel sounds are normal. There is no distension.     Palpations: Abdomen is soft. There is no mass.     Tenderness: There is abdominal tenderness. There is no right CVA tenderness, left CVA tenderness, guarding or rebound.     Hernia: No hernia is present.     Comments: Tenderness over right upper quadrant and right lower quadrant.   Musculoskeletal:      Right lower leg: No edema.     Left lower leg: No edema.  Neurological:     General: No focal deficit present.     Mental Status: She is alert and oriented to person, place, and time.  Psychiatric:        Mood and Affect: Mood normal.          Assessment & Plan:  Marland KitchenMarland KitchenAna was seen today for chest pain.  Diagnoses and all orders for this visit:  Right upper quadrant abdominal pain -     Lipase -     CMP14+EGFR -     CBC w/Diff/Platelet -     US Abdomen Complete; Future -     omeprazole (PRILOSEC) 40 MG capsule; Take 1 capsule (40 mg total) by mouth daily.  Nausea -     Lipase -     CMP14+EGFR -     CBC w/Diff/Platelet -     US Abdomen Complete; Future -     omeprazole (PRILOSEC) 40 MG capsule; Take 1 capsule (40 mg total) by mouth daily.  Gastroesophageal reflux disease without esophagitis -     Lipase -     CMP14+EGFR -     CBC w/Diff/Platelet -     omeprazole (PRILOSEC) 40 MG capsule; Take 1 capsule (40 mg total) by mouth daily.   Unclear etiology today Will get lipase to r/o pancreatitis Will get CBC to look for any internal blood loss Will get CMP to check kidney and liver functions She is having some reflux and will start omeprazole Reviewed medications and no medications that are prone to cause abdominal pain or nausea ? Gallstones or cholelithiasis will get abdominal ultrasound Follow up with PCP after ultrasound or if symptoms worsening or not improving.  BP improved on 2nd recheck   Tandy Gaw, PA-C

## 2023-09-26 ENCOUNTER — Encounter: Payer: Self-pay | Admitting: Physician Assistant

## 2023-09-26 DIAGNOSIS — R748 Abnormal levels of other serum enzymes: Secondary | ICD-10-CM | POA: Insufficient documentation

## 2023-09-26 DIAGNOSIS — K219 Gastro-esophageal reflux disease without esophagitis: Secondary | ICD-10-CM | POA: Insufficient documentation

## 2023-09-26 LAB — CMP14+EGFR
ALT: 74 [IU]/L — ABNORMAL HIGH (ref 0–32)
AST: 46 [IU]/L — ABNORMAL HIGH (ref 0–40)
Albumin: 4.2 g/dL (ref 3.9–4.9)
Alkaline Phosphatase: 120 [IU]/L (ref 44–121)
BUN/Creatinine Ratio: 30 — ABNORMAL HIGH (ref 9–23)
BUN: 20 mg/dL (ref 6–24)
Bilirubin Total: 0.4 mg/dL (ref 0.0–1.2)
CO2: 24 mmol/L (ref 20–29)
Calcium: 9.3 mg/dL (ref 8.7–10.2)
Chloride: 103 mmol/L (ref 96–106)
Creatinine, Ser: 0.66 mg/dL (ref 0.57–1.00)
Globulin, Total: 2.4 g/dL (ref 1.5–4.5)
Glucose: 98 mg/dL (ref 70–99)
Potassium: 4.2 mmol/L (ref 3.5–5.2)
Sodium: 142 mmol/L (ref 134–144)
Total Protein: 6.6 g/dL (ref 6.0–8.5)
eGFR: 107 mL/min/{1.73_m2} (ref >59)

## 2023-09-26 LAB — CBC WITH DIFFERENTIAL/PLATELET
Basophils Absolute: 0 10*3/uL (ref 0.0–0.2)
Basos: 0 %
EOS (ABSOLUTE): 0.3 10*3/uL (ref 0.0–0.4)
Eos: 5 %
Hematocrit: 45.1 % (ref 34.0–46.6)
Hemoglobin: 14.4 g/dL (ref 11.1–15.9)
Immature Grans (Abs): 0 10*3/uL (ref 0.0–0.1)
Immature Granulocytes: 0 %
Lymphocytes Absolute: 2.3 10*3/uL (ref 0.7–3.1)
Lymphs: 35 %
MCH: 29.4 pg (ref 26.6–33.0)
MCHC: 31.9 g/dL (ref 31.5–35.7)
MCV: 92 fL (ref 79–97)
Monocytes Absolute: 0.5 10*3/uL (ref 0.1–0.9)
Monocytes: 7 %
Neutrophils Absolute: 3.5 10*3/uL (ref 1.4–7.0)
Neutrophils: 53 %
Platelets: 155 10*3/uL (ref 150–450)
RBC: 4.89 x10E6/uL (ref 3.77–5.28)
RDW: 13.7 % (ref 11.7–15.4)
WBC: 6.6 10*3/uL (ref 3.4–10.8)

## 2023-09-26 LAB — LIPASE: Lipase: 31 U/L (ref 14–72)

## 2023-09-26 NOTE — Progress Notes (Signed)
Verba,   Make sure you are drinking more water. Kidney's look dry.  Lipase, your pancreatic enzyme, is normal.  Hemoglobin looks good.  White blood count looks good, no sign of infection.   Your liver enzymes are up some. There can be many reasons for this. Have you been drinking more alcohol or taking more tylenol products?  This could be viral as well or even due to weight gain.  Recheck liver enzymes and add hepatitis panel in one week to watch for any increasing trends.   Will also compare with abdominal u/s as well.

## 2023-09-27 ENCOUNTER — Ambulatory Visit (INDEPENDENT_AMBULATORY_CARE_PROVIDER_SITE_OTHER): Payer: BC Managed Care – PPO

## 2023-09-27 DIAGNOSIS — R1011 Right upper quadrant pain: Secondary | ICD-10-CM

## 2023-09-27 DIAGNOSIS — K760 Fatty (change of) liver, not elsewhere classified: Secondary | ICD-10-CM

## 2023-09-27 DIAGNOSIS — R110 Nausea: Secondary | ICD-10-CM

## 2023-09-27 DIAGNOSIS — R932 Abnormal findings on diagnostic imaging of liver and biliary tract: Secondary | ICD-10-CM | POA: Diagnosis not present

## 2023-10-05 ENCOUNTER — Ambulatory Visit (INDEPENDENT_AMBULATORY_CARE_PROVIDER_SITE_OTHER): Payer: BC Managed Care – PPO | Admitting: Family Medicine

## 2023-10-05 ENCOUNTER — Encounter: Payer: Self-pay | Admitting: Family Medicine

## 2023-10-05 VITALS — BP 107/69 | HR 74 | Ht 62.0 in | Wt 214.0 lb

## 2023-10-05 DIAGNOSIS — R748 Abnormal levels of other serum enzymes: Secondary | ICD-10-CM | POA: Diagnosis not present

## 2023-10-05 DIAGNOSIS — I10 Essential (primary) hypertension: Secondary | ICD-10-CM

## 2023-10-05 DIAGNOSIS — E039 Hypothyroidism, unspecified: Secondary | ICD-10-CM

## 2023-10-05 DIAGNOSIS — Z0000 Encounter for general adult medical examination without abnormal findings: Secondary | ICD-10-CM | POA: Diagnosis not present

## 2023-10-05 NOTE — Progress Notes (Signed)
Sarah Benjamin - 52 y.o. female MRN [id number]  Date of birth: June 10, 1971  Subjective Chief Complaint  Patient presents with   Annual Exam    HPI Sarah Benjamin is a 52 y.o. female here today for annual exam.   She reports that she is doing ok. .   Seen recently for RUQ pain.  LFT's were elevated on labs.  RUQ [place] ordered but has not resulted yet.  Omeprazole started for reflux symptoms.  She reports that she was given a paper prescription on paper but this turned out to be for another patient.  Today she reports that she is feeling better.   She tries to stay moderately active.  She feels that her diet is ok.   She is a non-smoker.  No EtOH use at this time.   Review of Systems  Constitutional:  Negative for chills, fever, malaise/fatigue and weight loss.  HENT:  Negative for congestion, ear pain and sore throat.   Eyes:  Negative for blurred vision, double vision and pain.  Respiratory:  Negative for cough and shortness of breath.   Cardiovascular:  Negative for chest pain and palpitations.  Gastrointestinal:  Negative for abdominal pain, blood in stool, constipation, heartburn and nausea.  Genitourinary:  Negative for dysuria and urgency.  Musculoskeletal:  Negative for joint pain and myalgias.  Neurological:  Negative for dizziness and headaches.  Endo/Heme/Allergies:  Does not bruise/bleed easily.  Psychiatric/Behavioral:  Negative for depression. The patient is not nervous/anxious and does not have insomnia.      Allergies  Allergen Reactions   Other Anaphylaxis    Kiwi, apples, grapefruit, onions, walnuts Patient is also allergic to pollen - runny nose, itchy eyes,ears and throat.    Past Medical History:  Diagnosis Date   Asthma    during pollen season, follows w/ PCP   Bell's palsy    as a younger woman   Chest pain    12/09/20 cardiology visit with Juliane Lack, MD. Pain thought to be musculoskeletal in nature / no furthing testing.   Complication of anesthesia     12/2022 - Patient stated that her O2 level dropped after surgery and she had to stay in the hospital overnight. Per MD note, patient had respiratory depression due to pain medication and was kept overnight.   COVID 2020   with pneumonia   Heart murmur    no problems from murmur per pt   History of PCOS    Hypertension    Follows w/ PCP.   Hypothyroidism    Follows w/ PCP.   Lumbar back pain    mild L4 - L5 DDD, very mild spinal stenosis   Paresthesia and pain of extremity    right hand - carpal tunnel syndrome   Pneumonia 2020   with Covid   Pre-diabetes    Vaginal Pap smear, abnormal    Wears glasses     Past Surgical History:  Procedure Laterality Date   COLONOSCOPY  01/14/2022   diverticula in sigmoid colon, 2 sessile polyps   CYSTOSCOPY N/A 05/10/2023   Procedure: CYSTOSCOPY;  Surgeon: Milas Hock, MD;  Location: [facility] [facility];  Service: Gynecology;  Laterality: N/A;   ENDOMETRIAL ABLATION  2010   ESSURE TUBAL LIGATION Left    around 2011   HYSTEROSCOPY N/A 01/11/2023   Procedure: HYSTEROSCOPY;  Surgeon: Milas Hock, MD;  Location: [facility] OR;  Service: Gynecology;  Laterality: N/A;   HYSTEROSCOPY WITH D & C N/A 01/11/2023  Procedure: DILATATION AND CURETTAGE;  Surgeon: Milas Hock, MD;  Location: [facility] OR;  Service: Gynecology;  Laterality: N/A;   TOTAL LAPAROSCOPIC HYSTERECTOMY WITH SALPINGECTOMY Bilateral 05/10/2023   Procedure: TOTAL LAPAROSCOPIC HYSTERECTOMY WITH BILATERAL SALPINGECTOMY;  Surgeon: Milas Hock, MD;  Location: [facility];  Service: Gynecology;  Laterality: Bilateral;   WISDOM TOOTH EXTRACTION      Social History   Socioeconomic History   Marital status: Divorced    Spouse name: Not on file   Number of children: Not on file   Years of education: Not on file   Highest education level: Not on file  Occupational History   Not on file  Tobacco Use   Smoking status: Never    Passive exposure: Past   Smokeless tobacco:  Never  Vaping Use   Vaping status: Never Used  Substance and Sexual Activity   Alcohol use: No   Drug use: No   Sexual activity: Not Currently    Birth control/protection: Surgical  Other Topics Concern   Not on file  Social History Narrative   Not on file   Social Determinants of Health   Financial Resource Strain: Not on file  Food Insecurity: No Food Insecurity (12/09/2020)   Received from [facility], [facility]   Hunger Vital Sign    Worried About Running Out of Food in the Last Year: Never true    Ran Out of Food in the Last Year: Never true  Transportation Needs: Not on file  Physical Activity: Not on file  Stress: Not on file  Social Connections: Unknown (03/27/2022)   [facility], [facility]   Social Network    Social Network: Not on file    Family History  Problem Relation Age of Onset   Diabetes Mother    Diabetes Brother    Colon cancer Neg Hx    Colon polyps Neg Hx    Esophageal cancer Neg Hx    Rectal cancer Neg Hx    Stomach cancer Neg Hx     Health Maintenance  Topic Date Due   COVID-19 Vaccine (4 - 2023-24 season) 10/11/2023 (Originally 07/16/2023)   Zoster Vaccines- Shingrix (1 of 2) 12/26/2023 (Originally 10/15/2022)   INFLUENZA VACCINE  02/12/2024 (Originally 06/15/2023)   Hepatitis C Screening  09/24/2024 (Originally 10/15/1990)   HIV Screening  09/24/2024 (Originally 10/16/1987)   MAMMOGRAM  10/12/2024   Colonoscopy  01/15/2027   DTaP/Tdap/Td (2 - Td or Tdap) 07/26/2028   HPV VACCINES  Aged Out     ----------------------------------------------------------------------------------------------------------------------------------------------------------------------------------------------------------------- Physical Exam BP 107/69 (BP Location: Left Arm, Patient Position: Sitting, Cuff Size: Large)   Pulse 74   Ht 5\' 2"  (1.575 m)   Wt 214 lb (97.1 kg)   LMP 01/27/2020   SpO2 96%   BMI 39.14 kg/m   Physical  Exam Constitutional:      General: She is not in acute distress. HENT:     Head: Normocephalic and atraumatic.     Right Ear: Tympanic membrane and ear canal normal.     Left Ear: Tympanic membrane and ear canal normal.     Nose: Nose normal.  Eyes:     General: No scleral icterus.    Conjunctiva/sclera: Conjunctivae normal.  Neck:     Thyroid: No thyromegaly.  Cardiovascular:     Rate and Rhythm: Normal rate and regular rhythm.     Heart sounds: Normal heart sounds.  Pulmonary:     Effort: Pulmonary effort is normal.  Breath sounds: Normal breath sounds.  Abdominal:     General: Bowel sounds are normal. There is no distension.     Palpations: Abdomen is soft.     Tenderness: There is no abdominal tenderness. There is no guarding.  Musculoskeletal:        General: Normal range of motion.     Cervical back: Normal range of motion and neck supple.  Lymphadenopathy:     Cervical: No cervical adenopathy.  Skin:    General: Skin is warm and dry.     Findings: No rash.  Neurological:     General: No focal deficit present.     Mental Status: She is alert and oriented to person, place, and time.     Cranial Nerves: No cranial nerve deficit.     Coordination: Coordination normal.  Psychiatric:        Mood and Affect: Mood normal.        Behavior: Behavior normal.     ------------------------------------------------------------------------------------------------------------------------------------------------------------------------------------------------------------------- Assessment and Plan  Well adult exam Well adult Orders Placed This Encounter  Procedures   CMP14+EGFR   CBC with Differential/Platelet   Lipid Panel With LDL/HDL Ratio   TSH  Screenings:  Per lab orders Immunizations:  declines flu vaccine Anticipatory guidance/Risk factor reduction:  Recommendations per AVS.    No orders of the defined types were placed in this encounter.   No follow-ups on  file.    This visit occurred during the SARS-CoV-2 public health emergency.  Safety protocols were in place, including screening questions prior to the visit, additional usage of staff PPE, and extensive cleaning of exam room while observing appropriate contact time as indicated for disinfecting solutions.

## 2023-10-05 NOTE — Assessment & Plan Note (Signed)
Well adult Orders Placed This Encounter  Procedures   CMP14+EGFR   CBC with Differential/Platelet   Lipid Panel With LDL/HDL Ratio   TSH  Screenings:  Per lab orders Immunizations:  declines flu vaccine Anticipatory guidance/Risk factor reduction:  Recommendations per AVS.

## 2023-10-05 NOTE — Patient Instructions (Signed)
Preventive Care 40-51 Years Old, Female Preventive care refers to lifestyle choices and visits with your health care provider that can promote health and wellness. Preventive care visits are also called wellness exams. What can I expect for my preventive care visit? Counseling Your health care provider may ask you questions about your: Medical history, including: Past medical problems. Family medical history. Pregnancy history. Current health, including: Menstrual cycle. Method of birth control. Emotional well-being. Home life and relationship well-being. Sexual activity and sexual health. Lifestyle, including: Alcohol, nicotine or tobacco, and drug use. Access to firearms. Diet, exercise, and sleep habits. Work and work environment. Sunscreen use. Safety issues such as seatbelt and bike helmet use. Physical exam Your health care provider will check your: Height and weight. These may be used to calculate your BMI (body mass index). BMI is a measurement that tells if you are at a healthy weight. Waist circumference. This measures the distance around your waistline. This measurement also tells if you are at a healthy weight and may help predict your risk of certain diseases, such as type 2 diabetes and high blood pressure. Heart rate and blood pressure. Body temperature. Skin for abnormal spots. What immunizations do I need?  Vaccines are usually given at various ages, according to a schedule. Your health care provider will recommend vaccines for you based on your age, medical history, and lifestyle or other factors, such as travel or where you work. What tests do I need? Screening Your health care provider may recommend screening tests for certain conditions. This may include: Lipid and cholesterol levels. Diabetes screening. This is done by checking your blood sugar (glucose) after you have not eaten for a while (fasting). Pelvic exam and Pap test. Hepatitis B test. Hepatitis C  test. HIV (human immunodeficiency virus) test. STI (sexually transmitted infection) testing, if you are at risk. Lung cancer screening. Colorectal cancer screening. Mammogram. Talk with your health care provider about when you should start having regular mammograms. This may depend on whether you have a family history of breast cancer. BRCA-related cancer screening. This may be done if you have a family history of breast, ovarian, tubal, or peritoneal cancers. Bone density scan. This is done to screen for osteoporosis. Talk with your health care provider about your test results, treatment options, and if necessary, the need for more tests. Follow these instructions at home: Eating and drinking  Eat a diet that includes fresh fruits and vegetables, whole grains, lean protein, and low-fat dairy products. Take vitamin and mineral supplements as recommended by your health care provider. Do not drink alcohol if: Your health care provider tells you not to drink. You are pregnant, may be pregnant, or are planning to become pregnant. If you drink alcohol: Limit how much you have to 0-1 drink a day. Know how much alcohol is in your drink. In the [place] one drink equals one 12 oz bottle of beer (355 mL), one 5 oz glass of wine (148 mL), or one 1 oz glass of hard liquor (44 mL). Lifestyle Brush your teeth every morning and night with fluoride toothpaste. Floss one time each day. Exercise for at least 30 minutes 5 or more days each week. Do not use any products that contain nicotine or tobacco. These products include cigarettes, chewing tobacco, and vaping devices, such as e-cigarettes. If you need help quitting, ask your health care provider. Do not use drugs. If you are sexually active, practice safe sex. Use a condom or other form of protection to   prevent STIs. If you do not wish to become pregnant, use a form of birth control. If you plan to become pregnant, see your health care provider for a  prepregnancy visit. Take aspirin only as told by your health care provider. Make sure that you understand how much to take and what form to take. Work with your health care provider to find out whether it is safe and beneficial for you to take aspirin daily. Find healthy ways to manage stress, such as: Meditation, yoga, or listening to music. Journaling. Talking to a trusted person. Spending time with friends and family. Minimize exposure to UV radiation to reduce your risk of skin cancer. Safety Always wear your seat belt while driving or riding in a vehicle. Do not drive: If you have been drinking alcohol. Do not ride with someone who has been drinking. When you are tired or distracted. While texting. If you have been using any mind-altering substances or drugs. Wear a helmet and other protective equipment during sports activities. If you have firearms in your house, make sure you follow all gun safety procedures. Seek help if you have been physically or sexually abused. What's next? Visit your health care provider once a year for an annual wellness visit. Ask your health care provider how often you should have your eyes and teeth checked. Stay up to date on all vaccines. This information is not intended to replace advice given to you by your health care provider. Make sure you discuss any questions you have with your health care provider. Document Revised: 04/28/2021 Document Reviewed: 04/28/2021 Elsevier Patient Education  2024 Elsevier Inc.  

## 2023-10-06 ENCOUNTER — Encounter: Payer: Self-pay | Admitting: Physician Assistant

## 2023-10-06 DIAGNOSIS — K760 Fatty (change of) liver, not elsewhere classified: Secondary | ICD-10-CM | POA: Insufficient documentation

## 2023-10-06 LAB — CBC WITH DIFFERENTIAL/PLATELET
Basophils Absolute: 0 10*3/uL (ref 0.0–0.2)
Basos: 1 %
EOS (ABSOLUTE): 0.2 10*3/uL (ref 0.0–0.4)
Eos: 3 %
Hematocrit: 45.3 % (ref 34.0–46.6)
Hemoglobin: 14.3 g/dL (ref 11.1–15.9)
Immature Grans (Abs): 0 10*3/uL (ref 0.0–0.1)
Immature Granulocytes: 0 %
Lymphocytes Absolute: 1.9 10*3/uL (ref 0.7–3.1)
Lymphs: 32 %
MCH: 28.8 pg (ref 26.6–33.0)
MCHC: 31.6 g/dL (ref 31.5–35.7)
MCV: 91 fL (ref 79–97)
Monocytes Absolute: 0.4 10*3/uL (ref 0.1–0.9)
Monocytes: 7 %
Neutrophils Absolute: 3.3 10*3/uL (ref 1.4–7.0)
Neutrophils: 57 %
Platelets: 210 10*3/uL (ref 150–450)
RBC: 4.96 x10E6/uL (ref 3.77–5.28)
RDW: 13.7 % (ref 11.7–15.4)
WBC: 5.8 10*3/uL (ref 3.4–10.8)

## 2023-10-06 LAB — CMP14+EGFR
ALT: 87 [IU]/L — ABNORMAL HIGH (ref 0–32)
AST: 61 [IU]/L — ABNORMAL HIGH (ref 0–40)
Albumin: 4.6 g/dL (ref 3.9–4.9)
Alkaline Phosphatase: 122 [IU]/L — ABNORMAL HIGH (ref 44–121)
BUN/Creatinine Ratio: 32 — ABNORMAL HIGH (ref 9–23)
BUN: 24 mg/dL (ref 6–24)
Bilirubin Total: 0.8 mg/dL (ref 0.0–1.2)
CO2: 24 mmol/L (ref 20–29)
Calcium: 9.4 mg/dL (ref 8.7–10.2)
Chloride: 100 mmol/L (ref 96–106)
Creatinine, Ser: 0.76 mg/dL (ref 0.57–1.00)
Globulin, Total: 2.4 g/dL (ref 1.5–4.5)
Glucose: 109 mg/dL — ABNORMAL HIGH (ref 70–99)
Potassium: 4.5 mmol/L (ref 3.5–5.2)
Sodium: 138 mmol/L (ref 134–144)
Total Protein: 7 g/dL (ref 6.0–8.5)
eGFR: 95 mL/min/{1.73_m2} (ref >59)

## 2023-10-06 LAB — LIPID PANEL WITH LDL/HDL RATIO
Cholesterol, Total: 218 mg/dL — ABNORMAL HIGH (ref 100–199)
HDL: 45 mg/dL (ref >39)
LDL Chol Calc (NIH): 152 mg/dL — ABNORMAL HIGH (ref 0–99)
LDL/HDL Ratio: 3.4 ratio — ABNORMAL HIGH (ref 0.0–3.2)
Triglycerides: 115 mg/dL (ref 0–149)
VLDL Cholesterol Cal: 21 mg/dL (ref 5–40)

## 2023-10-06 LAB — TSH: TSH: 7.24 u[IU]/mL — ABNORMAL HIGH (ref 0.450–4.500)

## 2023-10-06 NOTE — Progress Notes (Signed)
No acute abnormality. You do have fatty liver but should not be causing pain but can cause elevated liver enzymes.  Avoiding alcohol, losing weight, exercising is the best way to treat fatty liver.    I will route to PCP for next steps.

## 2023-10-09 ENCOUNTER — Encounter: Payer: Self-pay | Admitting: Family Medicine

## 2023-10-09 DIAGNOSIS — E039 Hypothyroidism, unspecified: Secondary | ICD-10-CM

## 2023-10-11 MED ORDER — LEVOTHYROXINE SODIUM 125 MCG PO TABS: 125.0000 ug | ORAL_TABLET | Freq: Every day | ORAL | 0 refills | Status: DC

## 2023-10-23 NOTE — Progress Notes (Unsigned)
Last pap- 10/20/22- neg Last mamo- 10/12/22

## 2023-10-26 ENCOUNTER — Ambulatory Visit: Payer: BC Managed Care – PPO | Admitting: Obstetrics and Gynecology

## 2023-11-22 NOTE — Progress Notes (Signed)
   ANNUAL EXAM Patient name: Sarah Benjamin MRN [id number]  Date of birth: 08-May-1971 Chief Complaint:   Annual Exam  History of Present Illness:   Sarah Benjamin is a 53 y.o. G2P2 female being seen today for a routine annual exam.   Current concerns: None - had had hot flashes but then with treatment of her thyroid  they resolved so she stopped E2 patch and has been doing well.    Last MXR: 09/2022  Last Pap/Pap History:  H/O abnormal pap: She did paps q6 months but then resolved. Never had surgery on her cervix.  10/2022: pap/hpv wnl   Health Maintenance Due  Topic Date Due   COVID-19 Vaccine (4 - 2024-25 season) 07/16/2023    Review of Systems:   Pertinent items are noted in HPI Denies any headaches, blurred vision, fatigue, shortness of breath, chest pain, abdominal pain, abnormal vaginal discharge/itching/odor/irritation, problems with periods, bowel movements, urination, or intercourse unless otherwise stated above.  Pertinent History Reviewed:  Reviewed past medical,surgical, social and family history.  Reviewed problem list, medications and allergies. Physical Assessment:   Vitals:   11/23/23 0916  BP: 122/75  Pulse: 75  Weight: 220 lb (99.8 kg)  Height: 5' 2 (1.575 m)  Body mass index is 40.24 kg/m.   Physical Examination:  General appearance - well appearing, and in no distress Mental status - alert, oriented to person, place, and time Psych:  She has a normal mood and affect Skin - warm and dry, normal color, no suspicious lesions noted Chest - effort normal Heart - normal rate  Breasts - breasts appear normal, no suspicious masses, no skin or nipple changes or axillary nodes Abdomen - soft, nontender, nondistended, no masses or organomegaly Pelvic -  VULVA: normal appearing vulva with no masses, tenderness or lesions  VAGINA: normal appearing vagina with normal color and discharge, no lesions  CERVIX: Surgically absent UTERUS: Surgically absent ADNEXA:  No adnexal masses or tenderness noted. Extremities:  No swelling or varicosities noted  Chaperone present for exam  No results found for this or any previous visit (from the past 24 hours).  Assessment & Plan:  Sarah Benjamin was seen today for annual exam.  Diagnoses and all orders for this visit:  Encounter for annual routine gynecological examination -     MM 3D SCREENING MAMMOGRAM BILATERAL BREAST; Future - Cervical cancer screening: Discussed guidelines. No longer indicated.  - Breast Health: Encouraged self breast awareness/SBE. Discussed limits of clinical breast exam for detecting breast cancer. Discussed importance of annual MXR. Rx given for MXR - Climacteric/Sexual health: Reviewed typical and atypical symptoms of menopause/peri-menopause. Discussed PMB and to call if any amount of spotting.  - Colonoscopy: Per PCP - F/U 12 months and prn  Hot flashes - Now resolved. She will let me know if they return even in s/o normal thyroid .      Orders Placed This Encounter  Procedures   MM 3D SCREENING MAMMOGRAM BILATERAL BREAST    Meds: No orders of the defined types were placed in this encounter.   Follow-up: No follow-ups on file.  Vina Solian, MD 11/23/2023 9:35 AM

## 2023-11-23 ENCOUNTER — Encounter: Payer: Self-pay | Admitting: Obstetrics and Gynecology

## 2023-11-23 ENCOUNTER — Ambulatory Visit (INDEPENDENT_AMBULATORY_CARE_PROVIDER_SITE_OTHER): Payer: BC Managed Care – PPO | Admitting: Obstetrics and Gynecology

## 2023-11-23 VITALS — BP 122/75 | HR 75 | Ht 62.0 in | Wt 220.0 lb

## 2023-11-23 DIAGNOSIS — R232 Flushing: Secondary | ICD-10-CM

## 2023-11-23 DIAGNOSIS — Z01419 Encounter for gynecological examination (general) (routine) without abnormal findings: Secondary | ICD-10-CM | POA: Diagnosis not present

## 2023-11-23 DIAGNOSIS — N951 Menopausal and female climacteric states: Secondary | ICD-10-CM

## 2024-01-04 ENCOUNTER — Ambulatory Visit: Payer: BC Managed Care – PPO | Admitting: Sports Medicine

## 2024-01-04 ENCOUNTER — Other Ambulatory Visit: Payer: Self-pay | Admitting: Family Medicine

## 2024-01-04 DIAGNOSIS — M722 Plantar fascial fibromatosis: Secondary | ICD-10-CM | POA: Diagnosis not present

## 2024-01-04 MED ORDER — MELOXICAM 15 MG PO TABS: ORAL_TABLET | ORAL | 3 refills | Status: DC

## 2024-01-04 NOTE — Assessment & Plan Note (Signed)
Very pleasant 52 year old female, several weeks of increasing pain plantar aspect left foot worse in the morning. On exam she does have pes cavus, tenderness at the plantar fascial origin, no tenderness at the Achilles insertion, adding an air heel brace, she will avoid barefoot walking, meloxicam, home physical therapy, return to see me in 4 to 6 weeks, injection if not better.

## 2024-01-04 NOTE — Telephone Encounter (Signed)
Patient scheduled for 01/16/24, thanks.

## 2024-01-04 NOTE — Telephone Encounter (Signed)
Pls contact patient. Recheck of TSH labs due since med dosage change. Thanks

## 2024-01-04 NOTE — Progress Notes (Signed)
    Procedures performed today:    None.  Independent interpretation of notes and tests performed by another provider:   None.  Brief History, Exam, Impression, and Recommendations:    Plantar fasciitis, left Very pleasant 53 year old female, several weeks of increasing pain plantar aspect left foot worse in the morning. On exam she does have pes cavus, tenderness at the plantar fascial origin, no tenderness at the Achilles insertion, adding an air heel brace, she will avoid barefoot walking, meloxicam, home physical therapy, return to see me in 4 to 6 weeks, injection if not better.    ____________________________________________ Ihor Austin. Benjamin Stain, M.D., ABFM., CAQSM., AME. Primary Care and Sports Medicine [facility]  Adjunct Professor of Family Medicine  [place] [employer]  Restaurant manager, fast food

## 2024-01-10 ENCOUNTER — Ambulatory Visit (INDEPENDENT_AMBULATORY_CARE_PROVIDER_SITE_OTHER): Payer: BC Managed Care – PPO

## 2024-01-10 DIAGNOSIS — Z1231 Encounter for screening mammogram for malignant neoplasm of breast: Secondary | ICD-10-CM

## 2024-01-10 DIAGNOSIS — Z01419 Encounter for gynecological examination (general) (routine) without abnormal findings: Secondary | ICD-10-CM

## 2024-01-16 ENCOUNTER — Ambulatory Visit: Payer: BC Managed Care – PPO | Admitting: Family Medicine

## 2024-02-01 ENCOUNTER — Encounter: Payer: Self-pay | Admitting: Sports Medicine

## 2024-02-01 ENCOUNTER — Ambulatory Visit (INDEPENDENT_AMBULATORY_CARE_PROVIDER_SITE_OTHER): Payer: BC Managed Care – PPO | Admitting: Sports Medicine

## 2024-02-01 DIAGNOSIS — M722 Plantar fascial fibromatosis: Secondary | ICD-10-CM | POA: Diagnosis not present

## 2024-02-01 MED ORDER — NAPROXEN 500 MG PO TABS: 500.0000 mg | ORAL_TABLET | Freq: Two times a day (BID) | ORAL | 3 refills | Status: DC

## 2024-02-01 NOTE — Assessment & Plan Note (Signed)
 Persistent pain medial and plantar aspect of the left foot, she has now had pain for over 6 weeks in spite of conservative treatment. She has done air heel bracing, avoidance of barefoot walking, meloxicam, home physical therapy. She has improved to some degree, her pain now is more medial midfoot more so than plantar aspect of the heel in fact there is no tenderness at the plantar heel. As this is not entirely classic for plantar fasciitis I would like confirmation with an MRI before we proceed with any invasive procedures. We will also switch her from meloxicam to naproxen due to inefficacy.

## 2024-02-01 NOTE — Progress Notes (Signed)
    Procedures performed today:    None.  Independent interpretation of notes and tests performed by another provider:   None.  Brief History, Exam, Impression, and Recommendations:    Plantar fasciitis, left Persistent pain medial and plantar aspect of the left foot, she has now had pain for over 6 weeks in spite of conservative treatment. She has done air heel bracing, avoidance of barefoot walking, meloxicam, home physical therapy. She has improved to some degree, her pain now is more medial midfoot more so than plantar aspect of the heel in fact there is no tenderness at the plantar heel. As this is not entirely classic for plantar fasciitis I would like confirmation with an MRI before we proceed with any invasive procedures. We will also switch her from meloxicam to naproxen due to inefficacy.    ____________________________________________ Ihor Austin. Benjamin Stain, M.D., ABFM., CAQSM., AME. Primary Care and Sports Medicine [facility]  Adjunct Professor of Family Medicine  [place] [employer] [facility] [employer]  Restaurant manager, fast food

## 2024-02-03 ENCOUNTER — Ambulatory Visit (INDEPENDENT_AMBULATORY_CARE_PROVIDER_SITE_OTHER)

## 2024-02-03 DIAGNOSIS — M722 Plantar fascial fibromatosis: Secondary | ICD-10-CM

## 2024-02-03 DIAGNOSIS — R609 Edema, unspecified: Secondary | ICD-10-CM | POA: Diagnosis not present

## 2024-02-03 DIAGNOSIS — M65972 Unspecified synovitis and tenosynovitis, left ankle and foot: Secondary | ICD-10-CM | POA: Diagnosis not present

## 2024-02-06 ENCOUNTER — Encounter: Payer: Self-pay | Admitting: Sports Medicine

## 2024-02-08 ENCOUNTER — Other Ambulatory Visit: Payer: Self-pay | Admitting: Family Medicine

## 2024-02-08 ENCOUNTER — Encounter (INDEPENDENT_AMBULATORY_CARE_PROVIDER_SITE_OTHER): Admitting: Family Medicine

## 2024-02-08 ENCOUNTER — Encounter: Payer: Self-pay | Admitting: Family Medicine

## 2024-02-08 DIAGNOSIS — E039 Hypothyroidism, unspecified: Secondary | ICD-10-CM | POA: Diagnosis not present

## 2024-02-08 DIAGNOSIS — R7401 Elevation of levels of liver transaminase levels: Secondary | ICD-10-CM | POA: Diagnosis not present

## 2024-02-08 NOTE — Progress Notes (Signed)
 Error-Only needed updated labs This encounter was created in error - please disregard.

## 2024-02-09 ENCOUNTER — Encounter: Payer: Self-pay | Admitting: Family Medicine

## 2024-02-09 LAB — SPECIMEN STATUS REPORT

## 2024-02-09 LAB — HEPATIC FUNCTION PANEL
ALT: 63 IU/L — ABNORMAL HIGH (ref 0–32)
AST: 50 IU/L — ABNORMAL HIGH (ref 0–40)
Albumin: 4.3 g/dL (ref 3.8–4.9)
Alkaline Phosphatase: 126 IU/L — ABNORMAL HIGH (ref 44–121)
Bilirubin Total: 0.6 mg/dL (ref 0.0–1.2)
Bilirubin, Direct: 0.13 mg/dL (ref 0.00–0.40)
Total Protein: 6.6 g/dL (ref 6.0–8.5)

## 2024-02-10 LAB — TSH: TSH: 0.085 u[IU]/mL — ABNORMAL LOW (ref 0.450–4.500)

## 2024-02-10 LAB — SPECIMEN STATUS REPORT

## 2024-02-16 ENCOUNTER — Encounter (INDEPENDENT_AMBULATORY_CARE_PROVIDER_SITE_OTHER): Payer: Self-pay | Admitting: Family Medicine

## 2024-02-16 DIAGNOSIS — E039 Hypothyroidism, unspecified: Secondary | ICD-10-CM

## 2024-02-16 NOTE — Telephone Encounter (Signed)
 Dr. Karie Schwalbe, please see mychart message sent by pt and advise.

## 2024-02-16 NOTE — Telephone Encounter (Signed)
 Hi Emily, after review of her chart, I never ordered any recent labwork for this patient, you might have the wrong doctor.

## 2024-02-16 NOTE — Telephone Encounter (Signed)

## 2024-02-18 ENCOUNTER — Encounter: Payer: Self-pay | Admitting: Sports Medicine

## 2024-02-19 ENCOUNTER — Telehealth: Payer: Self-pay | Admitting: Family Medicine

## 2024-02-19 ENCOUNTER — Other Ambulatory Visit: Payer: Self-pay | Admitting: Family Medicine

## 2024-02-19 ENCOUNTER — Encounter: Payer: Self-pay | Admitting: *Deleted

## 2024-02-19 MED ORDER — LEVOTHYROXINE SODIUM 125 MCG PO TABS: 125.0000 ug | ORAL_TABLET | Freq: Every day | ORAL | 0 refills | Status: DC

## 2024-02-19 MED ORDER — LEVOTHYROXINE SODIUM 112 MCG PO TABS: 112.0000 ug | ORAL_TABLET | Freq: Every day | ORAL | 1 refills | Status: AC

## 2024-02-19 NOTE — Telephone Encounter (Signed)
 Prescription Request  02/19/2024  LOV: 10/05/2023  What is the name of the medication or equipment? levothyroxine (SYNTHROID) 125 MCG tablet   Have you contacted your pharmacy to request a refill? Yes   Which pharmacy would you like this sent to?  [id number] - [place], [place] - [address]  [place] [address] Phone: [phone] Fax: [phone]     Patient notified that their request is being sent to the clinical staff for review and that they should receive a response within 2 business days.   Please advise at [facility] [phone]

## 2024-02-19 NOTE — Telephone Encounter (Signed)
 My mistake. Labwork was ordered by Dr. Ashley Royalty. Pt's thyroid medication was refilled for her today 4/7. Closing encounter.

## 2024-02-27 ENCOUNTER — Other Ambulatory Visit (INDEPENDENT_AMBULATORY_CARE_PROVIDER_SITE_OTHER)

## 2024-02-27 ENCOUNTER — Encounter: Payer: Self-pay | Admitting: Sports Medicine

## 2024-02-27 ENCOUNTER — Ambulatory Visit (INDEPENDENT_AMBULATORY_CARE_PROVIDER_SITE_OTHER): Admitting: Sports Medicine

## 2024-02-27 DIAGNOSIS — M722 Plantar fascial fibromatosis: Secondary | ICD-10-CM

## 2024-02-27 MED ORDER — TRIAMCINOLONE ACETONIDE 40 MG/ML IJ SUSP
40.0000 mg | Freq: Once | INTRAMUSCULAR | Status: AC
  Administered 2024-02-27: 40 mg via INTRAMUSCULAR

## 2024-02-27 NOTE — Progress Notes (Signed)
    Procedures performed today:    Procedure: Real-time Ultrasound Guided injection of the left plantar fascia Device: Samsung HS60  Verbal informed consent obtained.  Time-out conducted.  Noted no overlying erythema, induration, or other signs of local infection.  Skin prepped in a sterile fashion.  Local anesthesia: Topical Ethyl chloride.  With sterile technique and under real time ultrasound guidance: Noted thick plantar fascia, 1 cc Kenalog 40,  1 cc lidocaine, 1 cc bupivacaine injected easily just deep to the mid plantar fascia 1 inch from the calcaneal insertion Completed without difficulty  Advised to call if fevers/chills, erythema, induration, drainage, or persistent bleeding.  Images permanently stored and available for review in PACS.  Impression: Technically successful ultrasound guided injection.  Independent interpretation of notes and tests performed by another provider:     Brief History, Exam, Impression, and Recommendations:    Plantar fasciitis, left Very pleasant 53 year old female, chronic left plantar foot pain, persistent in spite of conservative treatment including air heel bracing, avoidance of barefoot walking, meloxicam, home PT, at the last visit her pain was somewhat more in the midfoot rather than the calcaneal insertion of the plantar fascia so we obtained an MRI, the MRI did confirm plantar fascial tearing with severe surrounding subfascial edema approximately an inch distal to the calcaneal insertion. Due to failure of conservative treatment, confirmation of the diagnosis and severity we will proceed with injection followed by boot immobilization for a month. Return to see me in 6 weeks.    ____________________________________________ Joselyn Nicely. Sandy Crumb, M.D., ABFM., CAQSM., AME. Primary Care and Sports Medicine [facility]  Adjunct Professor of Special Care Hospital Medicine  [employer]  [place]

## 2024-02-27 NOTE — Addendum Note (Signed)
 Addended by: OLIVA-AVELLANEDA, Finbar Nippert L on: 02/27/2024 03:10 PM   Modules accepted: Orders

## 2024-02-27 NOTE — Assessment & Plan Note (Signed)
 Very pleasant 53 year old female, chronic left plantar foot pain, persistent in spite of conservative treatment including air heel bracing, avoidance of barefoot walking, meloxicam, home PT, at the last visit her pain was somewhat more in the midfoot rather than the calcaneal insertion of the plantar fascia so we obtained an MRI, the MRI did confirm plantar fascial tearing with severe surrounding subfascial edema approximately an inch distal to the calcaneal insertion. Due to failure of conservative treatment, confirmation of the diagnosis and severity we will proceed with injection followed by boot immobilization for a month. Return to see me in 6 weeks.

## 2024-03-03 ENCOUNTER — Other Ambulatory Visit: Payer: Self-pay | Admitting: Family Medicine

## 2024-03-03 DIAGNOSIS — I10 Essential (primary) hypertension: Secondary | ICD-10-CM

## 2024-03-04 ENCOUNTER — Other Ambulatory Visit: Payer: Self-pay | Admitting: *Deleted

## 2024-03-04 MED ORDER — GABAPENTIN 600 MG PO TABS: 600.0000 mg | ORAL_TABLET | Freq: Two times a day (BID) | ORAL | 1 refills | Status: AC | PRN

## 2024-04-09 ENCOUNTER — Ambulatory Visit: Admitting: Sports Medicine

## 2024-04-09 DIAGNOSIS — M722 Plantar fascial fibromatosis: Secondary | ICD-10-CM | POA: Diagnosis not present

## 2024-04-09 NOTE — Assessment & Plan Note (Signed)
 This very pleasant 53 year old female returns, chronic left foot plantar pain, she had persistent pain in spite of conservative treatment including air heel bracing, avoidance of barefoot walking, Mobic , home PT, she had pain more in the midfoot rather than calcaneal insertion of the last visit so we obtained an MRI, the MRI did confirm plantar fascial tearing with severe surrounding subfascial edema about an inch distal to the calcaneal insertion. We did an injection at the last visit followed by boot immobilization. She returns today approximately 6 weeks later and she is doing really well, she has about 98% improvement, no pain, she will continue home physical therapy, she was able to get a nocturnal dorsiflexion splint, she will discontinue the boot, continue the splint and return to see me as needed.

## 2024-04-09 NOTE — Progress Notes (Signed)
    Procedures performed today:    None.  Independent interpretation of notes and tests performed by another provider:   None.  Brief History, Exam, Impression, and Recommendations:    Plantar fasciitis, left This very pleasant 53 year old female returns, chronic left foot plantar pain, she had persistent pain in spite of conservative treatment including air heel bracing, avoidance of barefoot walking, Mobic , home PT, she had pain more in the midfoot rather than calcaneal insertion of the last visit so we obtained an MRI, the MRI did confirm plantar fascial tearing with severe surrounding subfascial edema about an inch distal to the calcaneal insertion. We did an injection at the last visit followed by boot immobilization. She returns today approximately 6 weeks later and she is doing really well, she has about 98% improvement, no pain, she will continue home physical therapy, she was able to get a nocturnal dorsiflexion splint, she will discontinue the boot, continue the splint and return to see me as needed.    ____________________________________________ Joselyn Nicely. Sandy Crumb, M.D., ABFM., CAQSM., AME. Primary Care and Sports Medicine [facility]  Adjunct Professor of [facility] Medicine  [employer]  Restaurant manager, fast food

## 2024-07-16 ENCOUNTER — Encounter: Payer: Self-pay | Admitting: Sports Medicine

## 2024-07-26 ENCOUNTER — Ambulatory Visit: Payer: Self-pay

## 2024-07-26 NOTE — Telephone Encounter (Signed)
 FYI Only or Action Required?: FYI only for provider.  Patient was last seen in primary care on 04/09/2024 by Curtis Debby PARAS, MD.  Called Nurse Triage reporting Chest Pain.  Symptoms began yesterday.  Interventions attempted: Nothing.  Symptoms are: rapidly worsening.  Triage Disposition: Call EMS 911 Now  Patient/caregiver understands and will follow disposition?: YesCopied from CRM #[id number]. Topic: Clinical - Red Word Triage >> Jul 26, 2024  8:25 AM Zane F wrote: [facility] that prompted transfer to Nurse Triage:   Concern: soreness in left calf, chest tightness    Symptoms: chest tightness   When did the symptoms start?: yesterday morning; yesterday evening symptoms worsening   What have you done to aid in the concern ? Have you taken anything to assist with the matter?: elevated legs Reason for Disposition  [1] Chest pain lasts > 5 minutes AND [2] age > 30 AND [3] one or more cardiac risk factors (e.g., diabetes, high blood pressure, high cholesterol, obesity with BMI 30 or higher, smoker, or strong family history of heart disease)  Answer Assessment - Initial Assessment Questions Left calf started out hurting. Hurt to walk. Cramps are so bad.I elevated legs. My chest was 100 mph. I wanted to go to ED. Heaviness is still present. Leg is not swollen. Warm to touch. Pt declined RN calling 911.  I live 2 miles away. I'll get someone to take me.      1. LOCATION: Where does it hurt?       Heaviness on chest 2. RADIATION: Does the pain go anywhere else? (e.g., into neck, jaw, arms, back)     Middle to left 3. ONSET: When did the chest pain begin? (Minutes, hours or days)      Last night  4. PATTERN: Does the pain come and go, or has it been constant since it started?  Does it get worse with exertion?      Constant  5. DURATION: How long does it last (e.g., seconds, minutes, hours)     Constant  6. SEVERITY: How bad is the pain?  (e.g., Scale  1-10; mild, moderate, or severe)     5 7. CARDIAC RISK FACTORS: Do you have any history of heart problems or risk factors for heart disease? (e.g., angina, prior heart attack; diabetes, high blood pressure, high cholesterol, smoker, or strong family history of heart disease)     High BP 8. PULMONARY RISK FACTORS: Do you have any history of lung disease?  (e.g., blood clots in lung, asthma, emphysema, birth control pills)     Asthma  9. CAUSE: What do you think is causing the chest pain?     Not sure 10. OTHER SYMPTOMS: Do you have any other symptoms? (e.g., dizziness, nausea, vomiting, sweating, fever, difficulty breathing, cough) Left calf pain  Protocols used: Chest Pain-A-AH

## 2024-07-29 ENCOUNTER — Telehealth: Payer: Self-pay

## 2024-07-29 DIAGNOSIS — Z79899 Other long term (current) drug therapy: Secondary | ICD-10-CM | POA: Diagnosis not present

## 2024-07-29 DIAGNOSIS — R079 Chest pain, unspecified: Secondary | ICD-10-CM | POA: Diagnosis not present

## 2024-07-29 DIAGNOSIS — M79605 Pain in left leg: Secondary | ICD-10-CM | POA: Diagnosis not present

## 2024-07-29 DIAGNOSIS — E079 Disorder of thyroid, unspecified: Secondary | ICD-10-CM | POA: Diagnosis not present

## 2024-07-29 DIAGNOSIS — R7989 Other specified abnormal findings of blood chemistry: Secondary | ICD-10-CM | POA: Diagnosis not present

## 2024-07-29 DIAGNOSIS — M79662 Pain in left lower leg: Secondary | ICD-10-CM | POA: Diagnosis not present

## 2024-07-29 DIAGNOSIS — J45909 Unspecified asthma, uncomplicated: Secondary | ICD-10-CM | POA: Diagnosis not present

## 2024-07-29 DIAGNOSIS — R0789 Other chest pain: Secondary | ICD-10-CM | POA: Diagnosis not present

## 2024-07-29 DIAGNOSIS — R0602 Shortness of breath: Secondary | ICD-10-CM | POA: Diagnosis not present

## 2024-07-29 DIAGNOSIS — I10 Essential (primary) hypertension: Secondary | ICD-10-CM | POA: Diagnosis not present

## 2024-07-29 DIAGNOSIS — M79652 Pain in left thigh: Secondary | ICD-10-CM | POA: Diagnosis not present

## 2024-07-29 NOTE — Telephone Encounter (Signed)
 Left message for a return call. I don't see that she went to ED.

## 2024-07-29 NOTE — Telephone Encounter (Signed)
 Copied from CRM #[id number]. Topic: Clinical - Red Word Triage >> Jul 26, 2024  8:25 AM Zane F wrote: [facility] that prompted transfer to Nurse Triage:   Concern: soreness in left calf, chest tightness    Symptoms: chest tightness   When did the symptoms start?: yesterday morning; yesterday evening symptoms worsening   What have you done to aid in the concern ? Have you taken anything to assist with the matter?: elevated legs >> Jul 29, 2024  4:34 PM Susanna ORN wrote: Patient returning nurse Angela's call. Please have her to give patient a call back in the morning. Patient states she did not go to ED but instead she just went home to rest. She also went ahead and scheduled an appt with Dr. Alvia for 07/30/24.

## 2024-07-29 NOTE — Telephone Encounter (Signed)
 Patient informed to go to ER -  Spoke with Dr. Alvia and he also recommends patient got to ER for evaluation .  Patient is agreeable and will head to ER today - she is requesting to keep her scheduled appt with DR. Alvia for tomorrow as well.

## 2024-07-30 ENCOUNTER — Encounter: Payer: Self-pay | Admitting: Family Medicine

## 2024-07-30 ENCOUNTER — Ambulatory Visit: Admitting: Family Medicine

## 2024-07-30 ENCOUNTER — Ambulatory Visit

## 2024-07-30 VITALS — BP 124/82 | HR 63 | Ht 62.0 in | Wt 224.0 lb

## 2024-07-30 DIAGNOSIS — M79662 Pain in left lower leg: Secondary | ICD-10-CM | POA: Insufficient documentation

## 2024-07-30 DIAGNOSIS — M25562 Pain in left knee: Secondary | ICD-10-CM

## 2024-07-30 NOTE — Progress Notes (Signed)
 Sarah Benjamin - 53 y.o. female MRN [id number]  Date of birth: 06-10-1971  Subjective Chief Complaint  Patient presents with   Leg Pain    HPI Sarah Benjamin is a 53 y.o. female here today with complaint of pain in her left leg and calf.  Symptoms started a few days ago.  She does not recall any injury or overuse.  She was seen in the ED yesterday as she reported having some chest pain along with the pain.  She did have US  which was negative for DVT.  Labs in the ED were unremarkable except for elevation in TSH.  She does have tenderness along the calf.  Denies weakness in the leg.  No color change or cold sensation over the extremity or foot.  She does feel like the area along the back of her calf and knee is swollen.  ROS:  A comprehensive ROS was completed and negative except as noted per HPI  Allergies  Allergen Reactions   Other Anaphylaxis    Kiwi, apples, grapefruit, onions, walnuts Patient is also allergic to pollen - runny nose, itchy eyes,ears and throat.    Past Medical History:  Diagnosis Date   Asthma    during pollen season, follows w/ PCP   Bell's palsy    as a younger woman   Chest pain    12/09/20 cardiology visit with Lamar Muir, MD. Pain thought to be musculoskeletal in nature / no furthing testing.   Complication of anesthesia    12/2022 - Patient stated that her O2 level dropped after surgery and she had to stay in the hospital overnight. Per MD note, patient had respiratory depression due to pain medication and was kept overnight.   COVID 2020   with pneumonia   Heart murmur    no problems from murmur per pt   History of PCOS    Hypertension    Follows w/ PCP.   Hypothyroidism    Follows w/ PCP.   Lumbar back pain    mild L4 - L5 DDD, very mild spinal stenosis   Paresthesia and pain of extremity    right hand - carpal tunnel syndrome   Pneumonia 2020   with Covid   Pre-diabetes    Vaginal Pap smear, abnormal    Wears glasses     Past Surgical  History:  Procedure Laterality Date   COLONOSCOPY  01/14/2022   diverticula in sigmoid colon, 2 sessile polyps   CYSTOSCOPY N/A 05/10/2023   Procedure: CYSTOSCOPY;  Surgeon: Cleatus Moccasin, MD;  Location: [facility] [place];  Service: Gynecology;  Laterality: N/A;   ENDOMETRIAL ABLATION  2010   ESSURE TUBAL LIGATION Left    around 2011   HYSTEROSCOPY N/A 01/11/2023   Procedure: HYSTEROSCOPY;  Surgeon: Cleatus Moccasin, MD;  Location: [facility] OR;  Service: Gynecology;  Laterality: N/A;   HYSTEROSCOPY WITH D & C N/A 01/11/2023   Procedure: DILATATION AND CURETTAGE;  Surgeon: Cleatus Moccasin, MD;  [facility];  Service: Gynecology;  Laterality: N/A;   TOTAL LAPAROSCOPIC HYSTERECTOMY WITH SALPINGECTOMY Bilateral 05/10/2023   Procedure: TOTAL LAPAROSCOPIC HYSTERECTOMY WITH BILATERAL SALPINGECTOMY;  Surgeon: Cleatus Moccasin, MD;  Location: [facility];  Service: Gynecology;  Laterality: Bilateral;   WISDOM TOOTH EXTRACTION      Social History   Socioeconomic History   Marital status: Divorced    Spouse name: Not on file   Number of children: Not on file   Years of education: Not on file  Highest education level: Not on file  Occupational History   Not on file  Tobacco Use   Smoking status: Never    Passive exposure: Past   Smokeless tobacco: Never  Vaping Use   Vaping status: Never Used  Substance and Sexual Activity   Alcohol use: No   Drug use: No   Sexual activity: Not Currently    Birth control/protection: Surgical  Other Topics Concern   Not on file  Social History Narrative   Not on file   Social Drivers of Health   Financial Resource Strain: Not on file  Food Insecurity: No Food Insecurity (12/09/2020)   Received from [facility]   Hunger Vital Sign    Within the past 12 months, you worried that your food would run out before you got the money to buy more.: Never true    Within the past 12 months, the food you bought just didn't last and you didn't  have money to get more.: Never true  Transportation Needs: Not on file  Physical Activity: Not on file  Stress: Not on file  Social Connections: Unknown (03/27/2022)   Received from [facility]   Social Network    Social Network: Not on file    Family History  Problem Relation Age of Onset   Diabetes Mother    Diabetes Brother    Colon cancer Neg Hx    Colon polyps Neg Hx    Esophageal cancer Neg Hx    Rectal cancer Neg Hx    Stomach cancer Neg Hx     Health Maintenance  Topic Date Due   Hepatitis B Vaccines 19-59 Average Risk (1 of 3 - 19+ 3-dose series) Never done   Zoster Vaccines- Shingrix (1 of 2) Never done   Influenza Vaccine  Never done   COVID-19 Vaccine (4 - 2025-26 season) 07/15/2024   Hepatitis C Screening  09/24/2024 (Originally [birth year]/1990)   HIV Screening  09/24/2024 (Originally 10/16/1987)   Mammogram  01/09/2026   Colonoscopy  01/15/2027   DTaP/Tdap/Td (2 - Td or Tdap) 07/26/2028   Pneumococcal Vaccine: 50+ Years  Completed   HPV VACCINES  Aged Out   Meningococcal B Vaccine  Aged Out     ----------------------------------------------------------------------------------------------------------------------------------------------------------------------------------------------------------------- Physical Exam BP 124/82 (BP Location: Left Arm, Patient Position: Sitting, Cuff Size: Large)   Pulse 63   Ht 5' 2 (1.575 m)   Wt 224 lb (101.6 kg)   LMP 01/27/2020   SpO2 97%   BMI 40.97 kg/m   Physical Exam Constitutional:      Appearance: Normal appearance.  Cardiovascular:     Rate and Rhythm: Normal rate and regular rhythm.  Musculoskeletal:     Comments: Left lower extremity with tenderness palpation along the posterior calf.  Feels like she has a nodule just below the posterior knee there is tender as well.  Range of motion knee is fairly good.  There is no significant crepitus.  Neurological:     General: No focal deficit present.     Mental  Status: She is alert.  Psychiatric:        Mood and Affect: Mood normal.        Behavior: Behavior normal.     ------------------------------------------------------------------------------------------------------------------------------------------------------------------------------------------------------------------- Assessment and Plan  Pain of left calf She does have a little fullness and palpable nodule at the top of the calf behind the left knee.  Question Baker's cyst however this is not commented on on her recent ultrasound in the ED.  X-rays of  the left knee ordered.  Recommend trying to ice this area.   No orders of the defined types were placed in this encounter.   No follow-ups on file.

## 2024-07-30 NOTE — Telephone Encounter (Signed)
 This task has been completed as requested. Patient went to the ER 07/29/24 and was seen by the PCP on 07/30/24. No further action needed.

## 2024-07-30 NOTE — Patient Instructions (Addendum)
 Try magnesium glycinate at bedtime.  You can also consider Ashwagandha.   Try over the counter voltaren gel on the knee and calf area.

## 2024-07-30 NOTE — Assessment & Plan Note (Signed)
 She does have a little fullness and palpable nodule at the top of the calf behind the left knee.  Question Baker's cyst however this is not commented on on her recent ultrasound in the ED.  X-rays of the left knee ordered.  Recommend trying to ice this area.

## 2024-08-09 ENCOUNTER — Ambulatory Visit: Payer: Self-pay | Admitting: Family Medicine

## 2024-10-08 ENCOUNTER — Encounter: Admitting: Family Medicine

## 2024-10-22 ENCOUNTER — Encounter: Payer: Self-pay | Admitting: Family Medicine

## 2024-10-22 ENCOUNTER — Ambulatory Visit (INDEPENDENT_AMBULATORY_CARE_PROVIDER_SITE_OTHER): Admitting: Family Medicine

## 2024-10-22 ENCOUNTER — Encounter: Admitting: Family Medicine

## 2024-10-22 VITALS — BP 117/77 | HR 69 | Ht 62.0 in | Wt 223.0 lb

## 2024-10-22 DIAGNOSIS — I10 Essential (primary) hypertension: Secondary | ICD-10-CM

## 2024-10-22 DIAGNOSIS — Z0000 Encounter for general adult medical examination without abnormal findings: Secondary | ICD-10-CM | POA: Diagnosis not present

## 2024-10-22 DIAGNOSIS — R7303 Prediabetes: Secondary | ICD-10-CM

## 2024-10-22 DIAGNOSIS — E039 Hypothyroidism, unspecified: Secondary | ICD-10-CM | POA: Diagnosis not present

## 2024-10-22 DIAGNOSIS — Z13220 Encounter for screening for lipoid disorders: Secondary | ICD-10-CM

## 2024-10-22 DIAGNOSIS — R400 Somnolence: Secondary | ICD-10-CM | POA: Insufficient documentation

## 2024-10-22 DIAGNOSIS — Z1159 Encounter for screening for other viral diseases: Secondary | ICD-10-CM

## 2024-10-22 DIAGNOSIS — Z114 Encounter for screening for human immunodeficiency virus [HIV]: Secondary | ICD-10-CM

## 2024-10-22 NOTE — Progress Notes (Signed)
 Sarah Benjamin - 53 y.o. female MRN [id number]  Date of birth: 09/01/1971  Subjective Chief Complaint  Patient presents with   Annual Exam    HPI Sarah Benjamin is a 53 y.o. female here today for annual exam.    She reports that she is doing ok.  Notes some changes to appetite.  Feels like she wants to ear more at times.  Also having some daytime fatigue and morning headaches. She does admit that her sleep is often not very good.  .   She is somewhat active.  She feels that she is doing ok with diet.   She is a non-smoker.  No EtOH intake.   She does have regular dental care.   Review of Systems  Constitutional:  Negative for chills, fever, malaise/fatigue and weight loss.  HENT:  Negative for congestion, ear pain and sore throat.   Eyes:  Negative for blurred vision, double vision and pain.  Respiratory:  Negative for cough and shortness of breath.   Cardiovascular:  Negative for chest pain and palpitations.  Gastrointestinal:  Negative for abdominal pain, blood in stool, constipation, heartburn and nausea.  Genitourinary:  Negative for dysuria and urgency.  Musculoskeletal:  Negative for joint pain and myalgias.  Neurological:  Negative for dizziness and headaches.  Endo/Heme/Allergies:  Does not bruise/bleed easily.  Psychiatric/Behavioral:  Negative for depression. The patient is not nervous/anxious and does not have insomnia.     Allergies  Allergen Reactions   Other Anaphylaxis    Kiwi, apples, grapefruit, onions, walnuts Patient is also allergic to pollen - runny nose, itchy eyes,ears and throat.    Past Medical History:  Diagnosis Date   Asthma    during pollen season, follows w/ PCP   Bell's palsy    as a younger woman   Chest pain    12/09/20 cardiology visit with Lamar Muir, MD. Pain thought to be musculoskeletal in nature / no furthing testing.   Complication of anesthesia    12/2022 - Patient stated that her O2 level dropped after surgery and she had to  stay in the hospital overnight. Per MD note, patient had respiratory depression due to pain medication and was kept overnight.   COVID 2020   with pneumonia   Heart murmur    no problems from murmur per pt   History of PCOS    Hypertension    Follows w/ PCP.   Hypothyroidism    Follows w/ PCP.   Lumbar back pain    mild L4 - L5 DDD, very mild spinal stenosis   Paresthesia and pain of extremity    right hand - carpal tunnel syndrome   Pneumonia 2020   with Covid   Pre-diabetes    Vaginal Pap smear, abnormal    Wears glasses     Past Surgical History:  Procedure Laterality Date   COLONOSCOPY  01/14/2022   diverticula in sigmoid colon, 2 sessile polyps   CYSTOSCOPY N/A 05/10/2023   Procedure: CYSTOSCOPY;  Surgeon: Cleatus Moccasin, MD;  Location: [facility];  Service: Gynecology;  Laterality: N/A;   ENDOMETRIAL ABLATION  2010   ESSURE TUBAL LIGATION Left    around 2011   HYSTEROSCOPY N/A 01/11/2023   Procedure: HYSTEROSCOPY;  Surgeon: Cleatus Moccasin, MD;  Location: [facility] OR;  Service: Gynecology;  Laterality: N/A;   HYSTEROSCOPY WITH D & C N/A 01/11/2023   Procedure: DILATATION AND CURETTAGE;  Surgeon: Cleatus Moccasin, MD;  Location: [facility] OR;  Service: Gynecology;  Laterality: N/A;   TOTAL LAPAROSCOPIC HYSTERECTOMY WITH SALPINGECTOMY Bilateral 05/10/2023   Procedure: TOTAL LAPAROSCOPIC HYSTERECTOMY WITH BILATERAL SALPINGECTOMY;  Surgeon: Cleatus Moccasin, MD;  Location: [facility];  Service: Gynecology;  Laterality: Bilateral;   WISDOM TOOTH EXTRACTION      Social History   Socioeconomic History   Marital status: Divorced    Spouse name: Not on file   Number of children: Not on file   Years of education: Not on file   Highest education level: Not on file  Occupational History   Not on file  Tobacco Use   Smoking status: Never    Passive exposure: Past   Smokeless tobacco: Never  Vaping Use   Vaping status: Never Used  Substance and Sexual Activity    Alcohol use: No   Drug use: No   Sexual activity: Not Currently    Birth control/protection: Surgical  Other Topics Concern   Not on file  Social History Narrative   Not on file   Social Drivers of Health   Financial Resource Strain: Not on file  Food Insecurity: No Food Insecurity (12/09/2020)   Received from [facility]   Hunger Vital Sign    Within the past 12 months, you worried that your food would run out before you got the money to buy more.: Never true    Within the past 12 months, the food you bought just didn't last and you didn't have money to get more.: Never true  Transportation Needs: Not on file  Physical Activity: Not on file  Stress: Not on file  Social Connections: Unknown (03/27/2022)   Received from [facility]   Social Network    Social Network: Not on file    Family History  Problem Relation Age of Onset   Diabetes Mother    Diabetes Brother    Colon cancer Neg Hx    Colon polyps Neg Hx    Esophageal cancer Neg Hx    Rectal cancer Neg Hx    Stomach cancer Neg Hx     Health Maintenance  Topic Date Due   HIV Screening  Never done   Hepatitis C Screening  Never done   Zoster Vaccines- Shingrix (1 of 2) 01/20/2025 (Originally 10/16/1991)   Influenza Vaccine  02/11/2025 (Originally 06/14/2024)   Hepatitis B Vaccines 19-59 Average Risk (1 of 3 - 19+ 3-dose series) 10/22/2025 (Originally 10/16/1991)   COVID-19 Vaccine (4 - 2025-26 season) 11/07/2025 (Originally 07/15/2024)   Mammogram  01/09/2026   Colonoscopy  01/15/2027   DTaP/Tdap/Td (2 - Td or Tdap) 07/26/2028   Pneumococcal Vaccine: 50+ Years  Completed   HPV VACCINES  Aged Out   Meningococcal B Vaccine  Aged Out     ----------------------------------------------------------------------------------------------------------------------------------------------------------------------------------------------------------------- Physical Exam BP 117/77 (BP Location: Left Arm, Patient Position:  Sitting, Cuff Size: Large)   Pulse 69   Ht 5' 2 (1.575 m)   Wt 223 lb (101.2 kg)   LMP 01/27/2020   SpO2 98%   BMI 40.79 kg/m   Physical Exam Constitutional:      General: She is not in acute distress. HENT:     Head: Normocephalic and atraumatic.     Right Ear: Tympanic membrane and ear canal normal.     Left Ear: Tympanic membrane and ear canal normal.     Nose: Nose normal.  Eyes:     General: No scleral icterus.    Conjunctiva/sclera: Conjunctivae normal.  Neck:     Thyroid : No thyromegaly.  Cardiovascular:  Rate and Rhythm: Normal rate and regular rhythm.     Heart sounds: Normal heart sounds.  Pulmonary:     Effort: Pulmonary effort is normal.     Breath sounds: Normal breath sounds.  Abdominal:     General: Bowel sounds are normal. There is no distension.     Palpations: Abdomen is soft.     Tenderness: There is no abdominal tenderness. There is no guarding.  Musculoskeletal:        General: Normal range of motion.     Cervical back: Normal range of motion and neck supple.  Lymphadenopathy:     Cervical: No cervical adenopathy.  Skin:    General: Skin is warm and dry.     Findings: No rash.  Neurological:     General: No focal deficit present.     Mental Status: She is alert and oriented to person, place, and time.     Cranial Nerves: No cranial nerve deficit.     Coordination: Coordination normal.  Psychiatric:        Mood and Affect: Mood normal.        Behavior: Behavior normal.     ------------------------------------------------------------------------------------------------------------------------------------------------------------------------------------------------------------------- Assessment and Plan  Well adult exam Well adult Orders Placed This Encounter  Procedures   CMP14+EGFR   CBC with Differential/Platelet   Lipid Panel With LDL/HDL Ratio   HgB A1c   HIV antibody (with reflex)   Hepatitis C Antibody   TSH   Ambulatory  referral to Sleep Studies    Referral Priority:   Routine    Referral Type:   Consultation    Referral Reason:   Specialty Services Required    Number of Visits Requested:   1  Screenings:  Per lab orders Immunizations:  declines flu vaccine Anticipatory guidance/Risk factor reduction:  Recommendations per AVS.   Daytime sleepiness Concern for OSA.  Referral for Home sleep test.    No orders of the defined types were placed in this encounter.   No follow-ups on file.

## 2024-10-22 NOTE — Assessment & Plan Note (Signed)
 Well adult Orders Placed This Encounter  Procedures   CMP14+EGFR   CBC with Differential/Platelet   Lipid Panel With LDL/HDL Ratio   HgB A1c   HIV antibody (with reflex)   Hepatitis C Antibody   TSH   Ambulatory referral to Sleep Studies    Referral Priority:   Routine    Referral Type:   Consultation    Referral Reason:   Specialty Services Required    Number of Visits Requested:   1  Screenings:  Per lab orders Immunizations:  declines flu vaccine Anticipatory guidance/Risk factor reduction:  Recommendations per AVS.

## 2024-10-22 NOTE — Assessment & Plan Note (Signed)
 Concern for OSA.  Referral for Home sleep test.

## 2024-10-22 NOTE — Patient Instructions (Addendum)
 Try Magnesium Glycinate or Magnesium Threonate to help with sleep and stress.    Preventive Care 32-52 Years Old, Female Preventive care refers to lifestyle choices and visits with your health care provider that can promote health and wellness. Preventive care visits are also called wellness exams. What can I expect for my preventive care visit? Counseling Your health care provider may ask you questions about your: Medical history, including: Past medical problems. Family medical history. Pregnancy history. Current health, including: Menstrual cycle. Method of birth control. Emotional well-being. Home life and relationship well-being. Sexual activity and sexual health. Lifestyle, including: Alcohol, nicotine or tobacco, and drug use. Access to firearms. Diet, exercise, and sleep habits. Work and work astronomer. Sunscreen use. Safety issues such as seatbelt and bike helmet use. Physical exam Your health care provider will check your: Height and weight. These may be used to calculate your BMI (body mass index). BMI is a measurement that tells if you are at a healthy weight. Waist circumference. This measures the distance around your waistline. This measurement also tells if you are at a healthy weight and may help predict your risk of certain diseases, such as type 2 diabetes and high blood pressure. Heart rate and blood pressure. Body temperature. Skin for abnormal spots. What immunizations do I need?  Vaccines are usually given at various ages, according to a schedule. Your health care provider will recommend vaccines for you based on your age, medical history, and lifestyle or other factors, such as travel or where you work. What tests do I need? Screening Your health care provider may recommend screening tests for certain conditions. This may include: Lipid and cholesterol levels. Diabetes screening. This is done by checking your blood sugar (glucose) after you have not  eaten for a while (fasting). Pelvic exam and Pap test. Hepatitis B test. Hepatitis C test. HIV (human immunodeficiency virus) test. STI (sexually transmitted infection) testing, if you are at risk. Lung cancer screening. Colorectal cancer screening. Mammogram. Talk with your health care provider about when you should start having regular mammograms. This may depend on whether you have a family history of breast cancer. BRCA-related cancer screening. This may be done if you have a family history of breast, ovarian, tubal, or peritoneal cancers. Bone density scan. This is done to screen for osteoporosis. Talk with your health care provider about your test results, treatment options, and if necessary, the need for more tests. Follow these instructions at home: Eating and drinking  Eat a diet that includes fresh fruits and vegetables, whole grains, lean protein, and low-fat dairy products. Take vitamin and mineral supplements as recommended by your health care provider. Do not drink alcohol if: Your health care provider tells you not to drink. You are pregnant, may be pregnant, or are planning to become pregnant. If you drink alcohol: Limit how much you have to 0-1 drink a day. Know how much alcohol is in your drink. In the [place] one drink equals one 12 oz bottle of beer (355 mL), one 5 oz glass of wine (148 mL), or one 1 oz glass of hard liquor (44 mL). Lifestyle Brush your teeth every morning and night with fluoride toothpaste. Floss one time each day. Exercise for at least 30 minutes 5 or more days each week. Do not use any products that contain nicotine or tobacco. These products include cigarettes, chewing tobacco, and vaping devices, such as e-cigarettes. If you need help quitting, ask your health care provider. Do not use drugs. If you  are sexually active, practice safe sex. Use a condom or other form of protection to prevent STIs. If you do not wish to become pregnant, use a form of  birth control. If you plan to become pregnant, see your health care provider for a prepregnancy visit. Take aspirin only as told by your health care provider. Make sure that you understand how much to take and what form to take. Work with your health care provider to find out whether it is safe and beneficial for you to take aspirin daily. Find healthy ways to manage stress, such as: Meditation, yoga, or listening to music. Journaling. Talking to a trusted person. Spending time with friends and family. Minimize exposure to UV radiation to reduce your risk of skin cancer. Safety Always wear your seat belt while driving or riding in a vehicle. Do not drive: If you have been drinking alcohol. Do not ride with someone who has been drinking. When you are tired or distracted. While texting. If you have been using any mind-altering substances or drugs. Wear a helmet and other protective equipment during sports activities. If you have firearms in your house, make sure you follow all gun safety procedures. Seek help if you have been physically or sexually abused. What's next? Visit your health care provider once a year for an annual wellness visit. Ask your health care provider how often you should have your eyes and teeth checked. Stay up to date on all vaccines. This information is not intended to replace advice given to you by your health care provider. Make sure you discuss any questions you have with your health care provider. Document Revised: 04/28/2021 Document Reviewed: 04/28/2021 Elsevier Patient Education  2024 [employer].

## 2024-10-23 ENCOUNTER — Encounter: Payer: Self-pay | Admitting: Family Medicine

## 2024-10-23 LAB — CMP14+EGFR
ALT: 58 IU/L — ABNORMAL HIGH (ref 0–32)
AST: 46 IU/L — ABNORMAL HIGH (ref 0–40)
Albumin: 4.5 g/dL (ref 3.8–4.9)
Alkaline Phosphatase: 147 IU/L — ABNORMAL HIGH (ref 49–135)
BUN/Creatinine Ratio: 21 (ref 9–23)
BUN: 14 mg/dL (ref 6–24)
Bilirubin Total: 0.8 mg/dL (ref 0.0–1.2)
CO2: 24 mmol/L (ref 20–29)
Calcium: 9.5 mg/dL (ref 8.7–10.2)
Chloride: 102 mmol/L (ref 96–106)
Creatinine, Ser: 0.67 mg/dL (ref 0.57–1.00)
Globulin, Total: 2.5 g/dL (ref 1.5–4.5)
Glucose: 106 mg/dL — ABNORMAL HIGH (ref 70–99)
Potassium: 4.5 mmol/L (ref 3.5–5.2)
Sodium: 141 mmol/L (ref 134–144)
Total Protein: 7 g/dL (ref 6.0–8.5)
eGFR: 105 mL/min/1.73 (ref >59)

## 2024-10-23 LAB — CBC WITH DIFFERENTIAL/PLATELET
Basophils Absolute: 0 x10E3/uL (ref 0.0–0.2)
Basos: 0 %
EOS (ABSOLUTE): 0.2 x10E3/uL (ref 0.0–0.4)
Eos: 3 %
Hematocrit: 43.7 % (ref 34.0–46.6)
Hemoglobin: 14 g/dL (ref 11.1–15.9)
Immature Grans (Abs): 0 x10E3/uL (ref 0.0–0.1)
Immature Granulocytes: 0 %
Lymphocytes Absolute: 1.9 x10E3/uL (ref 0.7–3.1)
Lymphs: 26 %
MCH: 28.6 pg (ref 26.6–33.0)
MCHC: 32 g/dL (ref 31.5–35.7)
MCV: 89 fL (ref 79–97)
Monocytes Absolute: 0.6 x10E3/uL (ref 0.1–0.9)
Monocytes: 9 %
Neutrophils Absolute: 4.5 x10E3/uL (ref 1.4–7.0)
Neutrophils: 62 %
Platelets: 206 x10E3/uL (ref 150–450)
RBC: 4.89 x10E6/uL (ref 3.77–5.28)
RDW: 14.3 % (ref 11.7–15.4)
WBC: 7.3 x10E3/uL (ref 3.4–10.8)

## 2024-10-23 LAB — LIPID PANEL WITH LDL/HDL RATIO
Cholesterol, Total: 202 mg/dL — ABNORMAL HIGH (ref 100–199)
HDL: 47 mg/dL (ref >39)
LDL Chol Calc (NIH): 125 mg/dL — ABNORMAL HIGH (ref 0–99)
LDL/HDL Ratio: 2.7 ratio (ref 0.0–3.2)
Triglycerides: 168 mg/dL — ABNORMAL HIGH (ref 0–149)
VLDL Cholesterol Cal: 30 mg/dL (ref 5–40)

## 2024-10-23 LAB — HIV ANTIBODY (ROUTINE TESTING W REFLEX): HIV Screen 4th Generation wRfx: NONREACTIVE

## 2024-10-23 LAB — HEPATITIS C ANTIBODY: Hep C Virus Ab: NONREACTIVE

## 2024-10-23 LAB — TSH: TSH: 2.9 u[IU]/mL (ref 0.450–4.500)

## 2024-10-23 LAB — HEMOGLOBIN A1C
Est. average glucose Bld gHb Est-mCnc: 169 mg/dL
Hgb A1c MFr Bld: 7.5 % — ABNORMAL HIGH (ref 4.8–5.6)

## 2024-11-04 DIAGNOSIS — G4733 Obstructive sleep apnea (adult) (pediatric): Secondary | ICD-10-CM | POA: Diagnosis not present

## 2024-11-07 ENCOUNTER — Encounter: Payer: Self-pay | Admitting: Family Medicine

## 2024-11-07 ENCOUNTER — Ambulatory Visit: Payer: Self-pay | Admitting: Family Medicine

## 2024-11-11 ENCOUNTER — Encounter: Payer: Self-pay | Admitting: Family Medicine

## 2024-11-11 DIAGNOSIS — E119 Type 2 diabetes mellitus without complications: Secondary | ICD-10-CM | POA: Insufficient documentation
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
## 2024-11-13 MED ORDER — TIRZEPATIDE 5 MG/0.5ML ~~LOC~~ SOAJ: 5.0000 mg | SUBCUTANEOUS | 1 refills | Status: AC

## 2024-11-13 MED ORDER — TIRZEPATIDE 2.5 MG/0.5ML ~~LOC~~ SOAJ: 2.5000 mg | SUBCUTANEOUS | 0 refills | Status: AC

## 2024-11-15 ENCOUNTER — Other Ambulatory Visit (HOSPITAL_COMMUNITY): Payer: Self-pay

## 2024-11-15 ENCOUNTER — Telehealth: Payer: Self-pay

## 2024-11-15 NOTE — Telephone Encounter (Signed)
 Clinical questions answered and PA submitted.

## 2024-11-15 NOTE — Telephone Encounter (Signed)
 Pharmacy Patient Advocate Encounter   Received notification from Physician's Office that prior authorization for Mounjaro 2.5MG /0.5ML auto-injectors is required/requested.   Insurance verification completed.   The patient is insured through [facility].   Per test claim: PA required; PA started via CoverMyMeds. KEY [id number] . Waiting for clinical questions to populate.

## 2024-11-18 NOTE — Telephone Encounter (Signed)
"    11/18/24 11:39 AM Rodina,   Pharmacy Patient Advocate Encounter   Received notification from [facility] that Prior Authorization for Mounjaro  2.5mg /0.62ml has been APPROVED from 11/15/24 to 11/15/25  Last read by Shasta LILLETTE Corners at 11:40AM on 11/18/2024. "

## 2024-11-18 NOTE — Telephone Encounter (Signed)
 Pharmacy Patient Advocate Encounter  Received notification from [facility] that Prior Authorization for Mounjaro  2.5mg /0.96ml has been APPROVED from 11/15/24 to 11/15/25   PA #/Case ID/Reference #: [id number]

## 2024-11-21 ENCOUNTER — Encounter: Payer: Self-pay | Admitting: Family Medicine

## 2024-11-21 ENCOUNTER — Ambulatory Visit (INDEPENDENT_AMBULATORY_CARE_PROVIDER_SITE_OTHER): Admitting: Family Medicine

## 2024-11-21 VITALS — BP 133/80 | HR 68 | Temp 98.3°F | Ht 61.5 in | Wt 221.0 lb

## 2024-11-21 DIAGNOSIS — E119 Type 2 diabetes mellitus without complications: Secondary | ICD-10-CM

## 2024-11-21 DIAGNOSIS — K760 Fatty (change of) liver, not elsewhere classified: Secondary | ICD-10-CM

## 2024-11-21 DIAGNOSIS — F4310 Post-traumatic stress disorder, unspecified: Secondary | ICD-10-CM

## 2024-11-21 DIAGNOSIS — Z7985 Long-term (current) use of injectable non-insulin antidiabetic drugs: Secondary | ICD-10-CM

## 2024-11-21 DIAGNOSIS — Z6841 Body Mass Index (BMI) 40.0 and over, adult: Secondary | ICD-10-CM
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
## 2024-12-09 ENCOUNTER — Other Ambulatory Visit (HOSPITAL_BASED_OUTPATIENT_CLINIC_OR_DEPARTMENT_OTHER): Payer: Self-pay | Admitting: Family Medicine

## 2024-12-09 DIAGNOSIS — Z1231 Encounter for screening mammogram for malignant neoplasm of breast: Secondary | ICD-10-CM

## 2024-12-17 ENCOUNTER — Encounter: Payer: Self-pay | Admitting: Family Medicine

## 2024-12-17 ENCOUNTER — Ambulatory Visit: Admitting: Family Medicine

## 2024-12-17 VITALS — BP 160/91 | HR 60 | Temp 97.9°F | Ht 61.5 in | Wt 213.0 lb

## 2024-12-17 DIAGNOSIS — R61 Generalized hyperhidrosis: Secondary | ICD-10-CM | POA: Diagnosis not present

## 2024-12-17 DIAGNOSIS — Z1331 Encounter for screening for depression: Secondary | ICD-10-CM

## 2024-12-17 DIAGNOSIS — R5383 Other fatigue: Secondary | ICD-10-CM

## 2024-12-17 DIAGNOSIS — Z7985 Long-term (current) use of injectable non-insulin antidiabetic drugs: Secondary | ICD-10-CM | POA: Diagnosis not present

## 2024-12-17 DIAGNOSIS — E039 Hypothyroidism, unspecified: Secondary | ICD-10-CM | POA: Diagnosis not present

## 2024-12-17 DIAGNOSIS — E119 Type 2 diabetes mellitus without complications: Secondary | ICD-10-CM

## 2024-12-17 DIAGNOSIS — Z6839 Body mass index (BMI) 39.0-39.9, adult: Secondary | ICD-10-CM

## 2024-12-17 DIAGNOSIS — I10 Essential (primary) hypertension: Secondary | ICD-10-CM | POA: Diagnosis not present

## 2024-12-17 DIAGNOSIS — K760 Fatty (change of) liver, not elsewhere classified: Secondary | ICD-10-CM | POA: Diagnosis not present

## 2024-12-17 DIAGNOSIS — R0602 Shortness of breath: Secondary | ICD-10-CM

## 2024-12-18 LAB — FSH/LH
FSH: 51 m[IU]/mL
LH: 27.1 m[IU]/mL

## 2024-12-18 LAB — VITAMIN B12: Vitamin B-12: 503 pg/mL (ref 232–1245)

## 2024-12-18 LAB — INSULIN, RANDOM: INSULIN: 21.2 u[IU]/mL (ref 2.6–24.9)

## 2024-12-18 LAB — FOLATE: Folate: 10.8 ng/mL

## 2024-12-18 LAB — VITAMIN D 25 HYDROXY (VIT D DEFICIENCY, FRACTURES): Vit D, 25-Hydroxy: 11.1 ng/mL — ABNORMAL LOW (ref 30.0–100.0)

## 2024-12-19 ENCOUNTER — Ambulatory Visit: Payer: Self-pay | Admitting: Family Medicine

## 2024-12-26 ENCOUNTER — Encounter

## 2024-12-31 ENCOUNTER — Ambulatory Visit: Admitting: Family Medicine

## 2025-01-02 ENCOUNTER — Encounter

## 2025-01-09 ENCOUNTER — Encounter

## 2025-01-09 ENCOUNTER — Ambulatory Visit: Admitting: Obstetrics and Gynecology

## 2025-01-13 ENCOUNTER — Ambulatory Visit (HOSPITAL_BASED_OUTPATIENT_CLINIC_OR_DEPARTMENT_OTHER)

## 2025-04-01 ENCOUNTER — Encounter: Admitting: Skilled Nursing Facility1
